# Patient Record
Sex: Female | Born: 1985 | Race: White | Hispanic: No | Smoking: Current every day smoker
Health system: Southern US, Community
[De-identification: ages and names within clinical notes are randomized; demographics above are authoritative.]

## PROBLEM LIST (undated history)

## (undated) ENCOUNTER — Inpatient Hospital Stay: Payer: Self-pay

## (undated) DIAGNOSIS — J45909 Unspecified asthma, uncomplicated: Secondary | ICD-10-CM

## (undated) DIAGNOSIS — R569 Unspecified convulsions: Secondary | ICD-10-CM

## (undated) DIAGNOSIS — G5600 Carpal tunnel syndrome, unspecified upper limb: Secondary | ICD-10-CM

## (undated) DIAGNOSIS — D6851 Activated protein C resistance: Secondary | ICD-10-CM

## (undated) DIAGNOSIS — I82409 Acute embolism and thrombosis of unspecified deep veins of unspecified lower extremity: Secondary | ICD-10-CM

## (undated) DIAGNOSIS — G43909 Migraine, unspecified, not intractable, without status migrainosus: Secondary | ICD-10-CM

## (undated) HISTORY — PX: ABSCESS DRAINAGE: SHX1119

## (undated) HISTORY — PX: TUBAL LIGATION: SHX77

## (undated) HISTORY — PX: DILATION AND CURETTAGE OF UTERUS: SHX78

---
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
## 2006-05-18 ENCOUNTER — Encounter: Payer: Self-pay | Admitting: Maternal & Fetal Medicine

## 2006-05-20 ENCOUNTER — Emergency Department: Payer: Self-pay | Admitting: Emergency Medicine

## 2006-05-20 ENCOUNTER — Other Ambulatory Visit: Payer: Self-pay

## 2006-06-02 ENCOUNTER — Other Ambulatory Visit: Payer: Self-pay

## 2006-06-02 ENCOUNTER — Emergency Department: Payer: Self-pay | Admitting: Emergency Medicine

## 2006-06-14 ENCOUNTER — Observation Stay: Payer: Self-pay | Admitting: Obstetrics and Gynecology

## 2006-06-22 ENCOUNTER — Observation Stay: Payer: Self-pay | Admitting: Obstetrics and Gynecology

## 2006-06-29 ENCOUNTER — Observation Stay: Payer: Self-pay | Admitting: Obstetrics and Gynecology

## 2006-06-29 ENCOUNTER — Encounter: Payer: Self-pay | Admitting: Maternal & Fetal Medicine

## 2006-07-10 ENCOUNTER — Observation Stay: Payer: Self-pay | Admitting: Obstetrics and Gynecology

## 2006-07-15 ENCOUNTER — Inpatient Hospital Stay: Payer: Self-pay | Admitting: Obstetrics and Gynecology

## 2006-07-28 ENCOUNTER — Emergency Department: Payer: Self-pay | Admitting: Emergency Medicine

## 2006-08-05 ENCOUNTER — Emergency Department: Payer: Self-pay

## 2006-08-18 ENCOUNTER — Inpatient Hospital Stay: Payer: Self-pay | Admitting: Internal Medicine

## 2006-08-20 ENCOUNTER — Inpatient Hospital Stay: Payer: Self-pay | Admitting: Internal Medicine

## 2006-08-26 ENCOUNTER — Emergency Department: Payer: Self-pay | Admitting: Emergency Medicine

## 2006-10-18 ENCOUNTER — Emergency Department: Payer: Self-pay | Admitting: Emergency Medicine

## 2006-11-10 ENCOUNTER — Emergency Department: Payer: Self-pay | Admitting: Emergency Medicine

## 2006-12-01 ENCOUNTER — Emergency Department: Payer: Self-pay | Admitting: Emergency Medicine

## 2007-01-18 ENCOUNTER — Emergency Department: Payer: Self-pay | Admitting: Emergency Medicine

## 2007-01-24 ENCOUNTER — Emergency Department: Payer: Self-pay | Admitting: Emergency Medicine

## 2007-02-15 ENCOUNTER — Encounter: Payer: Self-pay | Admitting: Maternal & Fetal Medicine

## 2007-02-18 ENCOUNTER — Emergency Department: Payer: Self-pay | Admitting: Emergency Medicine

## 2007-02-22 ENCOUNTER — Encounter: Payer: Self-pay | Admitting: Maternal & Fetal Medicine

## 2007-03-08 ENCOUNTER — Encounter: Payer: Self-pay | Admitting: Maternal & Fetal Medicine

## 2007-04-08 ENCOUNTER — Ambulatory Visit: Payer: Self-pay | Admitting: Maternal & Fetal Medicine

## 2007-04-08 ENCOUNTER — Encounter: Payer: Self-pay | Admitting: Maternal & Fetal Medicine

## 2007-05-06 ENCOUNTER — Encounter: Payer: Self-pay | Admitting: Obstetrics and Gynecology

## 2007-06-10 ENCOUNTER — Emergency Department: Payer: Self-pay | Admitting: Emergency Medicine

## 2007-06-20 ENCOUNTER — Observation Stay: Payer: Self-pay | Admitting: Obstetrics and Gynecology

## 2007-07-10 ENCOUNTER — Observation Stay: Payer: Self-pay

## 2007-07-25 ENCOUNTER — Emergency Department: Payer: Self-pay | Admitting: Emergency Medicine

## 2007-08-06 ENCOUNTER — Emergency Department: Payer: Self-pay | Admitting: Emergency Medicine

## 2007-09-06 ENCOUNTER — Emergency Department: Payer: Self-pay | Admitting: Emergency Medicine

## 2008-01-06 ENCOUNTER — Emergency Department: Payer: Self-pay | Admitting: Emergency Medicine

## 2008-05-31 ENCOUNTER — Emergency Department: Payer: Self-pay | Admitting: Emergency Medicine

## 2008-07-12 ENCOUNTER — Observation Stay: Payer: Self-pay

## 2008-08-10 ENCOUNTER — Encounter: Payer: Self-pay | Admitting: Obstetrics and Gynecology

## 2008-08-17 ENCOUNTER — Encounter: Payer: Self-pay | Admitting: Obstetrics and Gynecology

## 2008-08-24 ENCOUNTER — Encounter: Payer: Self-pay | Admitting: Obstetrics and Gynecology

## 2008-09-10 ENCOUNTER — Observation Stay: Payer: Self-pay | Admitting: Obstetrics and Gynecology

## 2008-09-26 ENCOUNTER — Inpatient Hospital Stay: Payer: Self-pay | Admitting: Obstetrics and Gynecology

## 2008-11-03 ENCOUNTER — Emergency Department: Payer: Self-pay | Admitting: Emergency Medicine

## 2010-03-25 ENCOUNTER — Emergency Department: Payer: Self-pay | Admitting: Emergency Medicine

## 2010-08-09 ENCOUNTER — Emergency Department: Payer: Self-pay | Admitting: Emergency Medicine

## 2010-12-17 ENCOUNTER — Emergency Department: Payer: Self-pay | Admitting: Emergency Medicine

## 2011-01-12 ENCOUNTER — Emergency Department: Payer: Self-pay | Admitting: Emergency Medicine

## 2011-01-20 ENCOUNTER — Emergency Department: Payer: Self-pay | Admitting: Emergency Medicine

## 2011-09-24 ENCOUNTER — Emergency Department: Payer: Self-pay | Admitting: *Deleted

## 2012-03-05 ENCOUNTER — Emergency Department: Payer: Self-pay | Admitting: Emergency Medicine

## 2012-04-25 ENCOUNTER — Emergency Department: Payer: Self-pay | Admitting: Emergency Medicine

## 2012-04-26 LAB — URINALYSIS, COMPLETE
Bilirubin,UR: NEGATIVE
Blood: NEGATIVE
Glucose,UR: NEGATIVE mg/dL (ref 0–75)
Ketone: NEGATIVE
Nitrite: NEGATIVE
Ph: 6 (ref 4.5–8.0)
Protein: NEGATIVE
Squamous Epithelial: 6
WBC UR: 3 /HPF (ref 0–5)

## 2012-04-26 LAB — CBC
HCT: 42.5 % (ref 35.0–47.0)
HGB: 14.2 g/dL (ref 12.0–16.0)
MCH: 30.3 pg (ref 26.0–34.0)
MCHC: 33.3 g/dL (ref 32.0–36.0)
MCV: 91 fL (ref 80–100)
RBC: 4.68 10*6/uL (ref 3.80–5.20)
RDW: 13.9 % (ref 11.5–14.5)

## 2012-04-26 LAB — COMPREHENSIVE METABOLIC PANEL
Anion Gap: 7 (ref 7–16)
Calcium, Total: 8.9 mg/dL (ref 8.5–10.1)
Co2: 25 mmol/L (ref 21–32)
Glucose: 84 mg/dL (ref 65–99)
Osmolality: 278 (ref 275–301)
SGOT(AST): 29 U/L (ref 15–37)
SGPT (ALT): 21 U/L (ref 12–78)
Total Protein: 8 g/dL (ref 6.4–8.2)

## 2012-11-18 ENCOUNTER — Emergency Department: Payer: Self-pay | Admitting: Emergency Medicine

## 2012-11-21 ENCOUNTER — Emergency Department: Payer: Self-pay | Admitting: Emergency Medicine

## 2013-03-15 ENCOUNTER — Emergency Department: Payer: Self-pay | Admitting: Emergency Medicine

## 2013-03-15 LAB — CBC WITH DIFFERENTIAL/PLATELET
Basophil #: 0 10*3/uL (ref 0.0–0.1)
Eosinophil #: 0.3 10*3/uL (ref 0.0–0.7)
Eosinophil %: 4.1 %
HGB: 14.3 g/dL (ref 12.0–16.0)
Lymphocyte #: 1.8 10*3/uL (ref 1.0–3.6)
Lymphocyte %: 29 %
MCH: 30.5 pg (ref 26.0–34.0)
MCV: 90 fL (ref 80–100)
Monocyte %: 7.9 %
Neutrophil #: 3.6 10*3/uL (ref 1.4–6.5)

## 2013-03-15 LAB — DRUG SCREEN, URINE
Barbiturates, Ur Screen: NEGATIVE (ref ?–200)
Benzodiazepine, Ur Scrn: NEGATIVE (ref ?–200)
Cannabinoid 50 Ng, Ur ~~LOC~~: POSITIVE (ref ?–50)
Cocaine Metabolite,Ur ~~LOC~~: NEGATIVE (ref ?–300)
MDMA (Ecstasy)Ur Screen: NEGATIVE (ref ?–500)
Opiate, Ur Screen: NEGATIVE (ref ?–300)
Phencyclidine (PCP) Ur S: NEGATIVE (ref ?–25)
Tricyclic, Ur Screen: NEGATIVE (ref ?–1000)

## 2013-03-15 LAB — URINALYSIS, COMPLETE
Blood: NEGATIVE
Glucose,UR: NEGATIVE mg/dL (ref 0–75)
Ketone: NEGATIVE
Ph: 6 (ref 4.5–8.0)
RBC,UR: 2 /HPF (ref 0–5)

## 2013-03-15 LAB — COMPREHENSIVE METABOLIC PANEL
Albumin: 3.7 g/dL (ref 3.4–5.0)
Anion Gap: 3 — ABNORMAL LOW (ref 7–16)
Bilirubin,Total: 0.2 mg/dL (ref 0.2–1.0)
Creatinine: 0.85 mg/dL (ref 0.60–1.30)
EGFR (Non-African Amer.): >60
Osmolality: 269 (ref 275–301)
SGPT (ALT): 21 U/L (ref 12–78)
Sodium: 135 mmol/L — ABNORMAL LOW (ref 136–145)
Total Protein: 7.2 g/dL (ref 6.4–8.2)

## 2013-03-15 LAB — GC/CHLAMYDIA PROBE AMP

## 2013-03-15 LAB — CK: CK, Total: 34 U/L (ref 21–215)

## 2013-04-22 ENCOUNTER — Emergency Department: Payer: Self-pay | Admitting: Emergency Medicine

## 2013-09-26 ENCOUNTER — Ambulatory Visit: Payer: Self-pay

## 2013-10-01 ENCOUNTER — Inpatient Hospital Stay: Payer: Self-pay | Admitting: Surgery

## 2013-10-01 LAB — CBC WITH DIFFERENTIAL/PLATELET
BASOS ABS: 0.1 10*3/uL (ref 0.0–0.1)
Basophil %: 0.3 %
EOS ABS: 0.1 10*3/uL (ref 0.0–0.7)
Eosinophil %: 0.4 %
HCT: 36.5 % (ref 35.0–47.0)
HGB: 11.9 g/dL — ABNORMAL LOW (ref 12.0–16.0)
LYMPHS ABS: 2.1 10*3/uL (ref 1.0–3.6)
Lymphocyte %: 8.5 %
MCH: 29.8 pg (ref 26.0–34.0)
MCHC: 32.8 g/dL (ref 32.0–36.0)
MCV: 91 fL (ref 80–100)
Monocyte #: 1.7 x10 3/mm — ABNORMAL HIGH (ref 0.2–0.9)
Monocyte %: 7.1 %
Neutrophil #: 20.5 10*3/uL — ABNORMAL HIGH (ref 1.4–6.5)
Neutrophil %: 83.7 %
PLATELETS: 332 10*3/uL (ref 150–440)
RBC: 4.01 10*6/uL (ref 3.80–5.20)
RDW: 13.3 % (ref 11.5–14.5)
WBC: 24.5 10*3/uL — AB (ref 3.6–11.0)

## 2013-10-01 LAB — URINALYSIS, COMPLETE
BLOOD: NEGATIVE
Glucose,UR: NEGATIVE mg/dL (ref 0–75)
Ketone: NEGATIVE
NITRITE: NEGATIVE
Ph: 5 (ref 4.5–8.0)
Protein: 100
RBC,UR: 14 /HPF (ref 0–5)
Specific Gravity: 1.026 (ref 1.003–1.030)
WBC UR: 70 /HPF (ref 0–5)

## 2013-10-01 LAB — COMPREHENSIVE METABOLIC PANEL
ALBUMIN: 2.9 g/dL — AB (ref 3.4–5.0)
AST: 28 U/L (ref 15–37)
Alkaline Phosphatase: 161 U/L — ABNORMAL HIGH
Anion Gap: 7 (ref 7–16)
BILIRUBIN TOTAL: 0.9 mg/dL (ref 0.2–1.0)
BUN: 5 mg/dL — ABNORMAL LOW (ref 7–18)
CHLORIDE: 101 mmol/L (ref 98–107)
Calcium, Total: 8.8 mg/dL (ref 8.5–10.1)
Co2: 26 mmol/L (ref 21–32)
Creatinine: 0.96 mg/dL (ref 0.60–1.30)
EGFR (African American): >60
EGFR (Non-African Amer.): >60
GLUCOSE: 96 mg/dL (ref 65–99)
Osmolality: 265 (ref 275–301)
Potassium: 3.3 mmol/L — ABNORMAL LOW (ref 3.5–5.1)
SGPT (ALT): 31 U/L (ref 12–78)
Sodium: 134 mmol/L — ABNORMAL LOW (ref 136–145)
Total Protein: 7.9 g/dL (ref 6.4–8.2)

## 2013-10-03 LAB — URINE CULTURE

## 2013-10-03 LAB — VANCOMYCIN, TROUGH: VANCOMYCIN, TROUGH: 17 ug/mL (ref 10–20)

## 2013-10-03 LAB — CREATININE, SERUM
Creatinine: 1.27 mg/dL (ref 0.60–1.30)
EGFR (African American): >60
EGFR (Non-African Amer.): 58 — ABNORMAL LOW

## 2013-10-04 LAB — CREATININE, SERUM
CREATININE: 1.24 mg/dL (ref 0.60–1.30)
EGFR (Non-African Amer.): 60 — ABNORMAL LOW

## 2013-10-05 LAB — CREATININE, SERUM
CREATININE: 1.1 mg/dL (ref 0.60–1.30)
EGFR (African American): >60
EGFR (Non-African Amer.): >60

## 2013-10-05 LAB — VANCOMYCIN, TROUGH: Vancomycin, Trough: 15 ug/mL (ref 10–20)

## 2013-10-05 LAB — HEMOGLOBIN: HGB: 9.8 g/dL — ABNORMAL LOW (ref 12.0–16.0)

## 2013-10-06 LAB — CULTURE, BLOOD (SINGLE)

## 2013-11-29 ENCOUNTER — Encounter: Payer: Self-pay | Admitting: General Surgery

## 2013-12-22 ENCOUNTER — Encounter: Payer: Self-pay | Admitting: General Surgery

## 2014-07-15 NOTE — Op Note (Signed)
PATIENT NAME:  Brianna Ashley, Brianna Ashley MR#:  [id number] DATE OF BIRTH:  09-10-1985  DATE OF PROCEDURE:  10/04/2013  PREOPERATIVE DIAGNOSIS: Right buttock abscess.   POSTOPERATIVE DIAGNOSIS:  Right buttock abscess.  PROCEDURE PERFORMED: Examination under anesthesia, pulsed lavage irrigation of wound, sharp debridement of soft tissue including skin and subcutaneous fat.   SURGEON: Natale LayMark Tamasha Laplante, M.D. FACS  ASSISTANT: None.   ANESTHESIA: General endotracheal.   FINDINGS: Pus.   SPECIMENS: None.   DESCRIPTION OF PROCEDURE: With informed consent, general endotracheal anesthesia, and prone position, the patient'Ashley existing dressing was removed. Penrose drains were removed. The wound and area was prepped and draped with Betadine solution. Timeout was observed. A large amount of purulent material was drained from the largest of the open wounds. There were a total of 4 wounds created by Dr. Juliann PulseLundquist several days ago in the operating room. These were all probed with the Yankauer suction, appeared to be no un-drained pockets. Tissues appear to be viable underneath fibrinopurulent material. Pulsed lavage irrigation was performed with approximately 3 liters of normal saline. Hemostasis being obtained with point cautery. Inch wide Penrose drains were then placed and secured at the skin edges with nylon suture. Sterile dressings were then applied and the patient was then returned supine, extubated and taken to the recovery room in stable and satisfactory condition by anesthesia services.   ____________________________ Redge GainerMark A. Egbert GaribaldiBird, MD mab:ts D: 10/04/2013 11:24:26 ET T: 10/04/2013 11:55:18 ET JOB#: [id number]  cc: Loraine LericheMark A. Egbert GaribaldiBird, MD, <Dictator> Raynald KempMARK A Cayson Kalb MD ELECTRONICALLY SIGNED 10/09/2013 18:46

## 2014-07-15 NOTE — Op Note (Signed)
PATIENT NAME:  Brianna Ashley, [name]:  [id number] DATE OF BIRTH:  Aug 07, 1985  DATE OF PROCEDURE:  10/01/2013  ATTENDING PHYSICIAN: Dr. Salome Holmeshris Harrington Jobe.   PREOPERATIVE DIAGNOSIS: Large right buttock abscess with necrosis.   POSTOPERATIVE DIAGNOSIS: Large right buttock abscess with necrosis.   PROCEDURE PERFORMED: Incision and drainage and debridement of large right buttock abscess.   ANESTHESIA: General.   ESTIMATED BLOOD LOSS: 25 mL.   COMPLICATIONS: None.   SPECIMEN: None.   INDICATION FOR SURGERY: [name] is a 29 year old female who comes in with 3 days of worsening severe right buttock pain. She had a leukocytosis and a CT scan which showed subcutaneous emphysema and inflammation. She was brought to the operating room for concern for necrotizing soft tissue infection.   DETAILS OF PROCEDURE: Are as follows: Informed consent was obtained. [name] was brought to the operating room suite. She was induced. Endotracheal tube was placed. Anesthesia: General anesthesia was given. She was then flipped in the prone position. Her buttocks were prepped and draped in standard surgical fashion. A timeout was then performed correctly identifying the patient name, operative site, and procedure to be performed. I then examined her buttocks. She had a large area of necrosis with obvious purulence. This was incised and there was a large amount of necrotic tissue as well as purulence. I debrided this necrotic tissue. Total size was approximately 2 x 2 cm. I then placed a hemostat into the cavity which tracked up approximately 6 cm. I then placed counter incisions at the end of these tracks and through these placed 2 Penrose drains, which were sutured to the skin. The wound was then irrigated and debrided further. Once I was happy with hemostasis and debridement, I packed the wound with Betadine-soaked Kerlix and placed a sterile dressing. The patient was then awoken, extubated, and brought to the  postanesthesia care unit. There were no immediate complications. Needle, sponge, and instrument counts were correct at the end of the procedure.    ____________________________ Si Raiderhristopher A. Nga Rabon, MD cal:lt D: 10/02/2013 09:18:00 ET T: 10/02/2013 20:14:15 ET JOB#: [id number]  cc: Cristal Deerhristopher A. Tylen Leverich, MD, <Dictator> Jarvis NewcomerHRISTOPHER A Mackenzi Krogh MD ELECTRONICALLY SIGNED 10/20/2013 14:51

## 2014-07-15 NOTE — H&P (Signed)
   Subjective/Chief Complaint Right buttock pain x 3 days.  Recent fall   History of Present Illness Brianna Ashley is a pleasant 29 yo F with a history of FV leiden and PE who presents with 3 days of worsening right buttock pain.  Iniitally evaluated on July 4 following a fall on the buttocks.  Over the past 3 days she has had increased pain, redness and induration which has now became unbrearable.  No drainage.   Past History H/o PE FV leiden deficiency DVT Anemia Seizures ADD PTSD Depression Bipolar   Past Med/Surgical Hx:  Post Traumatic Stress Disorder:   Depression:   Bipolar Disorder:   Pulmonary Embolus:   Leiden V factor blood disorder, hypercoagulability:   Deep Vein Thrombosis - DVT:   Anemia:   Seizures:   ADD:   asthma:   ALLERGIES:  Ritalin: Resp Arrest  Ambien: Resp. Distress  Tramadol: Dizzy/Fainting  Family and Social History:  Family History Non-Contributory   Social History positive  tobacco, negative ETOH   + Tobacco Current (within 1 year)   Review of Systems:  Subjective/Chief Complaint Right buttock pain, swelling, redness   Fever/Chills No   Cough No   Sputum No   Abdominal Pain No   Diarrhea No   Constipation No   Nausea/Vomiting No   SOB/DOE No   Chest Pain No   Dysuria No   Tolerating PT No   Tolerating Diet Nauseated   Physical Exam:  GEN well developed, well nourished, no acute distress   HEENT pink conjunctivae, PERRL   NECK No masses   RESP normal resp effort  clear BS  no use of accessory muscles   CARD regular rate  no murmur   ABD denies tenderness  denies Flank Tenderness  no hernia  soft  normal BS   EXTR + right buttock erythema/induration   SKIN No rashes, No ulcers   NEURO cranial nerves deficit, negative rigidity, negative tremor, follows commands   PSYCH A+O to time, place, person, good insight    Assessment/Admission Diagnosis Right buttock cellulitis with soft tissue gas, concerning for  necrotizing soft tissue infection.   Plan IVF, abx, to OR for aggressive debridement for control of problable necrotizing soft tissue infection.   Electronic Signatures: Jarvis NewcomerLundquist, Dawaun Brancato A (MD)  (Signed 11-Jul-15 16:55)  Authored: CHIEF COMPLAINT and HISTORY, PAST MEDICAL/SURGIAL HISTORY, ALLERGIES, FAMILY AND SOCIAL HISTORY, REVIEW OF SYSTEMS, PHYSICAL EXAM, ASSESSMENT AND PLAN   Last Updated: 11-Jul-15 16:55 by Jarvis NewcomerLundquist, Adonis Yim A (MD)

## 2014-07-15 NOTE — Discharge Summary (Signed)
PATIENT NAME:  Brianna Ashley, Brianna Ashley MR#:  [id number] DATE OF BIRTH:  03-05-1986  DATE OF ADMISSION:  10/01/2013 DATE OF DISCHARGE:  10/05/2013  FINAL DIAGNOSIS: Soft tissue infection, right buttock.  PRINCIPLE PROCEDURES: Incision and drainage x 2.   HOSPITAL COURSE SUMMARY: The patient was admitted with soft tissue infection and underwent incision and drainage. She then underwent repeat incision and drainage on the 14th of July. She had adequate pain control. She was taught dressing changes. The patient was deemed suitable for discharge on the 15th of July. Medication reconciliation form will be found in the chart. Follow up in my office in 1 weeks' time.  Call with any questions.   ____________________________ Redge GainerMark A. Egbert GaribaldiBird, MD mab:jh D: 10/18/2013 19:23:00 ET T: 10/18/2013 19:31:25 ET JOB#: [id number]  cc: Loraine LericheMark A. Egbert GaribaldiBird, MD, <Dictator> Rusti Arizmendi A Vernice Mannina MD ELECTRONICALLY SIGNED 10/18/2013 21:00

## 2014-08-15 ENCOUNTER — Emergency Department
Admission: EM | Admit: 2014-08-15 | Discharge: 2014-08-16 | Disposition: A | Discharge status: Home / Self Care | Payer: Medicaid Other | Attending: Emergency Medicine | Admitting: Emergency Medicine

## 2014-08-15 ENCOUNTER — Emergency Department: Payer: Medicaid Other

## 2014-08-15 DIAGNOSIS — S61552A Open bite of left wrist, initial encounter: Secondary | ICD-10-CM | POA: Diagnosis not present

## 2014-08-15 DIAGNOSIS — Y998 Other external cause status: Secondary | ICD-10-CM | POA: Diagnosis not present

## 2014-08-15 DIAGNOSIS — S61452A Open bite of left hand, initial encounter: Secondary | ICD-10-CM

## 2014-08-15 DIAGNOSIS — W540XXA Bitten by dog, initial encounter: Secondary | ICD-10-CM | POA: Insufficient documentation

## 2014-08-15 DIAGNOSIS — Y9289 Other specified places as the place of occurrence of the external cause: Secondary | ICD-10-CM | POA: Insufficient documentation

## 2014-08-15 DIAGNOSIS — Z23 Encounter for immunization: Secondary | ICD-10-CM | POA: Diagnosis not present

## 2014-08-15 DIAGNOSIS — Z79899 Other long term (current) drug therapy: Secondary | ICD-10-CM | POA: Diagnosis not present

## 2014-08-15 DIAGNOSIS — Y9389 Activity, other specified: Secondary | ICD-10-CM | POA: Insufficient documentation

## 2014-08-15 MED ORDER — TETANUS-DIPHTHERIA TOXOIDS TD 5-2 LFU IM INJ
0.5000 mL | INJECTION | Freq: Once | INTRAMUSCULAR | Status: AC
  Administered 2014-08-16: 0.5 mL via INTRAMUSCULAR

## 2014-08-15 MED ORDER — AMOXICILLIN-POT CLAVULANATE 875-125 MG PO TABS
1.0000 | ORAL_TABLET | Freq: Once | ORAL | Status: AC
  Administered 2014-08-16: 1 via ORAL

## 2014-08-15 MED ORDER — BACITRACIN ZINC 500 UNIT/GM EX OINT
TOPICAL_OINTMENT | CUTANEOUS | Status: AC
  Filled 2014-08-15: qty 2.7

## 2014-08-15 MED ORDER — OXYCODONE-ACETAMINOPHEN 5-325 MG PO TABS
1.0000 | ORAL_TABLET | Freq: Once | ORAL | Status: AC
  Administered 2014-08-16: 1 via ORAL

## 2014-08-15 NOTE — ED Provider Notes (Signed)
[facility] Emergency Department Provider Note  ____________________________________________  Time seen: 11:30 PM  I have reviewed the triage vital signs and the nursing notes.   HISTORY  Chief Complaint Animal Bite      HPI Brianna Ashley is a 29 y.o. female presents with history of dog bite to the left wrists. Dog is currently in quarantine as it was her friend's dog who is at bedside. Patient unknown date of last tetanus shot. Patient currently admits to 9 out of 10 left wrist pain.     No past medical history on file.  There are no active problems to display for this patient.   No past surgical history on file.  Current Outpatient Rx  Name  Route  Sig  Dispense  Refill  . clonazePAM (KLONOPIN) 1 MG tablet   Oral   Take 1 tablet by mouth 3 (three) times daily.      2   . QUEtiapine (SEROQUEL) 25 MG tablet   Oral   Take 25 mg by mouth 2 (two) times daily.           Allergies Ambien and Ritalin  No family history on file.  Social History History  Substance Use Topics  . Smoking status: Not on file  . Smokeless tobacco: Not on file  . Alcohol Use: Not on file    Review of Systems  Constitutional: Negative for fever. Eyes: Negative for visual changes. ENT: Negative for sore throat. Cardiovascular: Negative for chest pain. Respiratory: Negative for shortness of breath. Gastrointestinal: Negative for abdominal pain, vomiting and diarrhea. Genitourinary: Negative for dysuria. Musculoskeletal: Negative for back pain. Left wrist pain positive Skin: Negative for rash. Neurological: Negative for headaches, focal weakness or numbness.   10-point ROS otherwise negative.  ____________________________________________   PHYSICAL EXAM:  VITAL SIGNS: ED Triage Vitals  Enc Vitals Group     BP 08/15/14 2157 120/76 mmHg     Pulse Rate 08/15/14 2157 83     Resp 08/15/14 2157 18     Temp 08/15/14 2157 98.5 F (36.9 C)      Temp Source 08/15/14 2157 Oral     SpO2 08/15/14 2157 100 %     Weight 08/15/14 2157 235 lb (106.595 kg)     Height 08/15/14 2157 5\' 2"  (1.575 m)     Head Cir --      Peak Flow --      Pain Score 08/15/14 2159 6     Pain Loc --      Pain Edu? --      Excl. in GC? --      Constitutional: Alert and oriented. Well appearing and in no distress. Eyes: Conjunctivae are normal. PERRL. Normal extraocular movements. ENT   Head: Normocephalic and atraumatic.   Nose: No congestion/rhinnorhea.   Mouth/Throat: Mucous membranes are moist.   Neck: No stridor. Cardiovascular: Normal rate, regular rhythm. Normal and symmetric distal pulses are present in all extremities. No murmurs, rubs, or gallops. Respiratory: Normal respiratory effort without tachypnea nor retractions. Breath sounds are clear and equal bilaterally. No wheezes/rales/rhonchi. Gastrointestinal: Soft and nontender. No distention. There is no CVA tenderness. Genitourinary: deferred Musculoskeletal: No joint effusions.  No lower extremity tenderness nor edema. Left wrist pain with palpation. Neurologic:  Normal speech and language. No gross focal neurologic deficits are appreciated. Speech is normal.  Skin:  Skin is warm, dry . No rash noted. Patient is noted to the left wrist Psychiatric: Mood and affect are normal.  Speech and behavior are normal. Patient exhibits appropriate insight and judgment.  ____________________________________________    RADIOLOGY left wrist x-ray negative  ____________________________________________     INITIAL IMPRESSION / ASSESSMENT AND PLAN / ED COURSE  Pertinent labs & imaging results that were available during my care of the patient were reviewed by me and considered in my medical decision making (see chart for details).  Physical exam consistent with dog bite as such patient received Augmentin 875 mg by mouth and Percocet for  pain.  ____________________________________________   FINAL CLINICAL IMPRESSION(S) / ED DIAGNOSES  Final diagnoses:  Dog bite, hand, left, initial encounter      Darci Current, MD 08/16/14 [phone]

## 2014-08-15 NOTE — ED Notes (Signed)
Pt has abrasions to left hand from dog bite, owner is here with patient and dog has been taken for testing.

## 2014-08-16 MED ORDER — OXYCODONE-ACETAMINOPHEN 5-325 MG PO TABS
ORAL_TABLET | ORAL | Status: AC
  Administered 2014-08-16: 1 via ORAL
  Filled 2014-08-16: qty 1

## 2014-08-16 MED ORDER — AMOXICILLIN-POT CLAVULANATE 875-125 MG PO TABS
ORAL_TABLET | ORAL | Status: AC
  Administered 2014-08-16: 1 via ORAL
  Filled 2014-08-16: qty 1

## 2014-08-16 MED ORDER — TETANUS-DIPHTHERIA TOXOIDS TD 5-2 LFU IM INJ
INJECTION | INTRAMUSCULAR | Status: AC
  Administered 2014-08-16: 0.5 mL via INTRAMUSCULAR
  Filled 2014-08-16: qty 0.5

## 2014-08-16 MED ORDER — AMOXICILLIN-POT CLAVULANATE 875-125 MG PO TABS: 1.0000 | ORAL_TABLET | Freq: Two times a day (BID) | ORAL | Status: DC

## 2014-08-16 MED ORDER — OXYCODONE-ACETAMINOPHEN 5-325 MG PO TABS: 1.0000 | ORAL_TABLET | ORAL | Status: DC | PRN

## 2014-08-16 NOTE — Discharge Instructions (Signed)

## 2014-08-26 ENCOUNTER — Encounter: Payer: Self-pay | Admitting: Emergency Medicine

## 2014-08-26 DIAGNOSIS — G8929 Other chronic pain: Secondary | ICD-10-CM | POA: Diagnosis not present

## 2014-08-26 DIAGNOSIS — M545 Low back pain: Secondary | ICD-10-CM | POA: Insufficient documentation

## 2014-08-26 DIAGNOSIS — Z87828 Personal history of other (healed) physical injury and trauma: Secondary | ICD-10-CM | POA: Insufficient documentation

## 2014-08-26 DIAGNOSIS — Z720 Tobacco use: Secondary | ICD-10-CM | POA: Insufficient documentation

## 2014-08-26 DIAGNOSIS — Z792 Long term (current) use of antibiotics: Secondary | ICD-10-CM | POA: Insufficient documentation

## 2014-08-26 DIAGNOSIS — F419 Anxiety disorder, unspecified: Secondary | ICD-10-CM | POA: Diagnosis not present

## 2014-08-26 DIAGNOSIS — Z79899 Other long term (current) drug therapy: Secondary | ICD-10-CM | POA: Diagnosis not present
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
## 2014-08-27 ENCOUNTER — Emergency Department
Admission: EM | Admit: 2014-08-27 | Discharge: 2014-08-27 | Disposition: A | Discharge status: Home / Self Care | Payer: Medicaid Other | Attending: Emergency Medicine | Admitting: Emergency Medicine

## 2014-08-27 DIAGNOSIS — M545 Low back pain, unspecified: Secondary | ICD-10-CM

## 2014-08-27 MED ORDER — OXYCODONE-ACETAMINOPHEN 5-325 MG PO TABS
1.0000 | ORAL_TABLET | Freq: Once | ORAL | Status: AC
Start: 2014-08-27 — End: 2014-08-27
  Administered 2014-08-27: 1 via ORAL

## 2014-08-27 MED ORDER — PREDNISONE 20 MG PO TABS
ORAL_TABLET | ORAL | Status: AC
  Administered 2014-08-27: 60 mg via ORAL
  Filled 2014-08-27: qty 3

## 2014-08-27 MED ORDER — KETOROLAC TROMETHAMINE 10 MG PO TABS: 10.0000 mg | ORAL_TABLET | Freq: Four times a day (QID) | ORAL | Status: DC | PRN

## 2014-08-27 MED ORDER — OXYCODONE-ACETAMINOPHEN 5-325 MG PO TABS
ORAL_TABLET | ORAL | Status: AC
Start: 2014-08-27 — End: 2014-08-27
  Administered 2014-08-27: 1 via ORAL
  Filled 2014-08-27: qty 1

## 2014-08-27 MED ORDER — PREDNISONE 20 MG PO TABS
60.0000 mg | ORAL_TABLET | Freq: Once | ORAL | Status: AC
  Administered 2014-08-27: 60 mg via ORAL

## 2014-08-27 NOTE — Discharge Instructions (Signed)
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
## 2014-09-20 ENCOUNTER — Encounter: Payer: Self-pay | Admitting: Medical Oncology

## 2014-09-20 ENCOUNTER — Emergency Department
Admission: EM | Admit: 2014-09-20 | Discharge: 2014-09-20 | Disposition: A | Discharge status: Home / Self Care | Payer: Medicaid Other | Attending: Emergency Medicine | Admitting: Emergency Medicine

## 2014-09-20 DIAGNOSIS — Z79899 Other long term (current) drug therapy: Secondary | ICD-10-CM | POA: Diagnosis not present

## 2014-09-20 DIAGNOSIS — K029 Dental caries, unspecified: Secondary | ICD-10-CM | POA: Insufficient documentation

## 2014-09-20 DIAGNOSIS — H9203 Otalgia, bilateral: Secondary | ICD-10-CM | POA: Insufficient documentation

## 2014-09-20 DIAGNOSIS — K088 Other specified disorders of teeth and supporting structures: Secondary | ICD-10-CM | POA: Diagnosis present

## 2014-09-20 DIAGNOSIS — K047 Periapical abscess without sinus: Secondary | ICD-10-CM | POA: Diagnosis not present

## 2014-09-20 DIAGNOSIS — L0291 Cutaneous abscess, unspecified: Secondary | ICD-10-CM

## 2014-09-20 DIAGNOSIS — Z720 Tobacco use: Secondary | ICD-10-CM | POA: Diagnosis not present

## 2014-09-20 DIAGNOSIS — K0889 Other specified disorders of teeth and supporting structures: Secondary | ICD-10-CM

## 2014-09-20 MED ORDER — OXYCODONE-ACETAMINOPHEN 5-325 MG PO TABS
1.0000 | ORAL_TABLET | Freq: Once | ORAL | Status: AC
  Administered 2014-09-20: 1 via ORAL

## 2014-09-20 MED ORDER — CLINDAMYCIN HCL 300 MG PO CAPS: 300.0000 mg | ORAL_CAPSULE | Freq: Four times a day (QID) | ORAL | Status: DC

## 2014-09-20 MED ORDER — IBUPROFEN 800 MG PO TABS: 800.0000 mg | ORAL_TABLET | Freq: Three times a day (TID) | ORAL | Status: DC | PRN

## 2014-09-20 MED ORDER — LIDOCAINE VISCOUS 2 % MT SOLN
15.0000 mL | Freq: Once | OROMUCOSAL | Status: AC
  Administered 2014-09-20: 15 mL via OROMUCOSAL

## 2014-09-20 MED ORDER — OXYCODONE-ACETAMINOPHEN 5-325 MG PO TABS: 1.0000 | ORAL_TABLET | Freq: Three times a day (TID) | ORAL | Status: DC | PRN

## 2014-09-20 MED ORDER — OXYCODONE-ACETAMINOPHEN 5-325 MG PO TABS
ORAL_TABLET | ORAL | Status: AC
  Filled 2014-09-20: qty 1

## 2014-09-20 MED ORDER — LIDOCAINE VISCOUS 2 % MT SOLN
OROMUCOSAL | Status: AC
  Filled 2014-09-20: qty 15

## 2014-09-20 NOTE — ED Provider Notes (Signed)
[facility] Emergency Department Provider Note  ____________________________________________  Time seen: Approximately 8:18 PM  I have reviewed the triage vital signs and the nursing notes.   HISTORY  Chief Complaint Dental Pain and Abscess   HPI Brianna Ashley is a 29 y.o. female presents to the ER for multiple medical complaints. Patient states that she is having her right lower dental pain with an abscess that she can fill. Patient states that she has several broken teeth and history of dental pain as well as abscesses. Reports continues to eat injury well. Denies fever. States pain is currently 7 out of 10. Patient states pain has been present 2-3 days and reports that she has tried to drain abscess at home unsuccessfully. States she tried to "poke it with a needle. "  Patient also reports bilateral air pain. States left ear hurts worse than right. Denies fall or injury. Denies hearing changes. Denies foreign body. States here pain is currently 4 out of 10. States unsure if pain is radiating from teeth. Patient states pain has been present times one day.  Also reports that she has 2 abscesses in her groin area. Patient states that these occur frequently for her after she shaves. Denies redness, drainage. Reports these have been present 2 or 3 days. States pain to this area is 5 out of 10 and aching. Patient states that it hurts to touch.  Denies fevers, abdominal pain, neck or back pain. Reports continues to eat and drink well.   History reviewed. No pertinent past medical history. Anxiety Chronic pain There are no active problems to display for this patient.   History reviewed. No pertinent past surgical history.  Current Outpatient Rx  Name  Route  Sig  Dispense  Refill  .           . clonazePAM (KLONOPIN) 1 MG tablet   Oral   Take 1 tablet by mouth 3 (three) times daily.      2   . ketorolac (TORADOL) 10 MG tablet   Oral   Take 1 tablet (10  mg total) by mouth every 6 (six) hours as needed for moderate pain.   12 tablet   0   .           . QUEtiapine (SEROQUEL) 25 MG tablet   Oral   Take 25 mg by mouth 2 (two) times daily.           Allergies Ambien and Ritalin  No family history on file.  Social History History  Substance Use Topics  . Smoking status: Current Every Day Smoker -- 2.00 packs/day for 20 years    Types: Cigarettes  . Smokeless tobacco: Not on file  . Alcohol Use: No    Review of Systems Constitutional: No fever/chills Eyes: No visual changes. ENT: No sore throat. Positive for dental pain and ear pain as above. Cardiovascular: Denies chest pain. Respiratory: Denies shortness of breath. Gastrointestinal: No abdominal pain.  No nausea, no vomiting.  No diarrhea.  No constipation. Genitourinary: Negative for dysuria. Musculoskeletal: Negative for back pain. Skin: Negative for rash. Positive for abscesses as above. Neurological: Negative for headaches, focal weakness or numbness.  10-point ROS otherwise negative.  ____________________________________________   PHYSICAL EXAM:  VITAL SIGNS: ED Triage Vitals  Enc Vitals Group     BP 09/20/14 1835 117/64 mmHg     Pulse Rate 09/20/14 1835 65     Resp 09/20/14 1835 18     Temp 09/20/14  1835 98 F (36.7 C)     Temp Source 09/20/14 1835 Oral     SpO2 09/20/14 1835 98 %     Weight 09/20/14 1835 240 lb (108.863 kg)     Height 09/20/14 1835 5\' 5"  (1.651 m)     Head Cir --      Peak Flow --      Pain Score 09/20/14 1836 10     Pain Loc --      Pain Edu? --      Excl. in GC? --   Exam completed with Lauren RN at bedside.  Constitutional: Alert and oriented. Well appearing and in no acute distress. Eyes: Conjunctivae are normal. PERRL. EOMI. Head: Atraumatic. No facial swelling or erythema. Ears: right: no erythema, normal TMs left: minimal erythema, normal TMs, no exudate or drainage. Hearing grossly intact bilaterally.  Nose: No  congestion/rhinnorhea. Mouth/Throat: Mucous membranes are moist.  Oropharynx non-erythematous. Widespread dental decay with multiple fractures. Right lower teeth with multiple fractures and dental caries with moderate tender to palpation along teeth 28 through 31 with palpable fluctuant abscess 0.5 x 0.5 cm to inner gumline. Mild surrounding erythema. No other dental abscess palpated or visualized. Neck: No stridor.  No cervical spine tenderness to palpation. Hematological/Lymphatic/Immunilogical: No cervical lymphadenopathy. Cardiovascular: Normal rate, regular rhythm. Grossly normal heart sounds.  Good peripheral circulation. Respiratory: Normal respiratory effort.  No retractions. Lungs CTAB. Gastrointestinal: Soft and nontender. No distention. No abdominal bruits. No CVA tenderness. Musculoskeletal: No lower extremity tenderness nor edema.  No joint effusions. Neurologic:  Normal speech and language. No gross focal neurologic deficits are appreciated. Speech is normal. No gait instability. Skin:  Skin is warm, dry and intact. No rash noted. Except: Left upper thigh adjacent to left inguinal crease small minimally erythematous area of induration 0.5 x 0.5 cm. No surrounding erythema. No fluctuance. No I&D indication. Mild to moderate tender to palpation. No Drainage. Left lateral mons pubis with superficial 1 x 1 cm area of induration minimally erythematous. No surrounding erythema. No fluctuance. No I&D indication. Mild to moderate tender to palpation. No drainage. Psychiatric: Mood and affect are normal. Speech and behavior are normal.  ____________________________________________   LABS (all labs ordered are listed, but only abnormal results are displayed)  Labs Reviewed - No data to display ____________________________________________   PROCEDURES  Procedure(s) performed: yes  Procedure explained to patient and verbal consent obtained. Anesthesia obtained with 10 ML's of 2% viscous  lidocaine,   Right lower inner gumline abscess incised with #11 blade with immediate moderate amount purulent return. Patient reports pain immediately improved post incision. Patient tolerated procedure well.  ____________________________________________   INITIAL IMPRESSION / ASSESSMENT AND PLAN / ED COURSE  Pertinent labs & imaging results that were available during my care of the patient were reviewed by me and considered in my medical decision making (see chart for details).  Presents to the ER for complaints of multiple medical complaints. Complaints include bilateral ear pain for right lower dental pain with dental abscess as well as abscesses to left groin. Suspect dental radiation pain causing ear pain.  Right lower dental abscess drained in ER with immediate improved pain. Left groin with 2 small indurated areas, no fluctuance. No I&D indicated. We'll treat patient with oral clindamycin, ibuprofen and Percocet as needed for breakthrough pain. Discussed strict follow-up and return parameters. Patient agreed to plan. ___________________________________________   FINAL CLINICAL IMPRESSION(S) / ED DIAGNOSES  Final diagnoses:  Otalgia of both ears  Dental abscess  Pain, dental  Abscess to left upper thigh and perineal       Renford Dills, NP 09/20/14 2121  Loleta Rose, MD 09/21/14 1610

## 2014-09-20 NOTE — ED Notes (Signed)
Pt ambulatory to triage with c/o toothache and abscess to thigh.

## 2014-09-20 NOTE — Discharge Instructions (Signed)
Take medication as prescribed. Rinse mouth with warm salt water multiple times per day follow-up with dentist as soon as possible.  Avoid shaving as that can increase abscesses in the perineal area. Apply warm compresses.   Follow-up with her primary care physician this week.  Return to the ER for new or worsening concerns.  Dental Abscess A dental abscess is a collection of infected fluid (pus) from a bacterial infection in the inner part of the tooth (pulp). It usually occurs at the end of the tooth's root.  CAUSES   Severe tooth decay.  Trauma to the tooth that allows bacteria to enter into the pulp, such as a broken or chipped tooth. SYMPTOMS   Severe pain in and around the infected tooth.  Swelling and redness around the abscessed tooth or in the mouth or face.  Tenderness.  Pus drainage.  Bad breath.  Bitter taste in the mouth.  Difficulty swallowing.  Difficulty opening the mouth.  Nausea.  Vomiting.  Chills.  Swollen neck glands. DIAGNOSIS   A medical and dental history will be taken.  An examination will be performed by tapping on the abscessed tooth.  X-rays may be taken of the tooth to identify the abscess. TREATMENT The goal of treatment is to eliminate the infection. You may be prescribed antibiotic medicine to stop the infection from spreading. A root canal may be performed to save the tooth. If the tooth cannot be saved, it may be pulled (extracted) and the abscess may be drained.  HOME CARE INSTRUCTIONS  Only take over-the-counter or prescription medicines for pain, fever, or discomfort as directed by your caregiver.  Rinse your mouth (gargle) often with salt water ( tsp salt in 8 oz [250 ml] of warm water) to relieve pain or swelling.  Do not drive after taking pain medicine (narcotics).  Do not apply heat to the outside of your face.  Return to your dentist for further treatment as directed. SEEK MEDICAL CARE IF:  Your pain is not  helped by medicine.  Your pain is getting worse instead of better. SEEK IMMEDIATE MEDICAL CARE IF:  You have a fever or persistent symptoms for more than 2-3 days.  You have a fever and your symptoms suddenly get worse.  You have chills or a very bad headache.  You have problems breathing or swallowing.  You have trouble opening your mouth.  You have swelling in the neck or around the eye. Document Released: 03/10/2005 Document Revised: 12/03/2011 Document Reviewed: 06/18/2010 [facility] Patient Information 2015 [place], [place]. This information is not intended to replace advice given to you by your health care provider. Make sure you discuss any questions you have with your health care provider.  Abscess An abscess is an infected area that contains a collection of pus and debris.It can occur in almost any part of the body. An abscess is also known as a furuncle or boil. CAUSES  An abscess occurs when tissue gets infected. This can occur from blockage of oil or sweat glands, infection of hair follicles, or a minor injury to the skin. As the body tries to fight the infection, pus collects in the area and creates pressure under the skin. This pressure causes pain. People with weakened immune systems have difficulty fighting infections and get certain abscesses more often.  SYMPTOMS Usually an abscess develops on the skin and becomes a painful mass that is red, warm, and tender. If the abscess forms under the skin, you may feel a moveable soft  area under the skin. Some abscesses break open (rupture) on their own, but most will continue to get worse without care. The infection can spread deeper into the body and eventually into the bloodstream, causing you to feel ill.  DIAGNOSIS  Your caregiver will take your medical history and perform a physical exam. A sample of fluid may also be taken from the abscess to determine what is causing your infection. TREATMENT  Your caregiver may prescribe  antibiotic medicines to fight the infection. However, taking antibiotics alone usually does not cure an abscess. Your caregiver may need to make a small cut (incision) in the abscess to drain the pus. In some cases, gauze is packed into the abscess to reduce pain and to continue draining the area. HOME CARE INSTRUCTIONS   Only take over-the-counter or prescription medicines for pain, discomfort, or fever as directed by your caregiver.  If you were prescribed antibiotics, take them as directed. Finish them even if you start to feel better.  If gauze is used, follow your caregiver's directions for changing the gauze.  To avoid spreading the infection:  Keep your draining abscess covered with a bandage.  Wash your hands well.  Do not share personal care items, towels, or whirlpools with others.  Avoid skin contact with others.  Keep your skin and clothes clean around the abscess.  Keep all follow-up appointments as directed by your caregiver. SEEK MEDICAL CARE IF:   You have increased pain, swelling, redness, fluid drainage, or bleeding.  You have muscle aches, chills, or a general ill feeling.  You have a fever. MAKE SURE YOU:   Understand these instructions.  Will watch your condition.  Will get help right away if you are not doing well or get worse. Document Released: 12/18/2004 Document Revised: 09/09/2011 Document Reviewed: 05/23/2011 [facility] Patient Information 2015 [place], [place]. This information is not intended to replace advice given to you by your health care provider. Make sure you discuss any questions you have with your health care provider.  Otalgia The most common reason for this in children is an infection of the middle ear. Pain from the middle ear is usually caused by a build-up of fluid and pressure behind the eardrum. Pain from an earache can be sharp, dull, or burning. The pain may be temporary or constant. The middle ear is connected to the nasal passages  by a short narrow tube called the Eustachian tube. The Eustachian tube allows fluid to drain out of the middle ear, and helps keep the pressure in your ear equalized. CAUSES  A cold or allergy can block the Eustachian tube with inflammation and the build-up of secretions. This is especially likely in small children, because their Eustachian tube is shorter and more horizontal. When the Eustachian tube closes, the normal flow of fluid from the middle ear is stopped. Fluid can accumulate and cause stuffiness, pain, hearing loss, and an ear infection if germs start growing in this area. SYMPTOMS  The symptoms of an ear infection may include fever, ear pain, fussiness, increased crying, and irritability. Many children will have temporary and minor hearing loss during and right after an ear infection. Permanent hearing loss is rare, but the risk increases the more infections a child has. Other causes of ear pain include retained water in the outer ear canal from swimming and bathing. Ear pain in adults is less likely to be from an ear infection. Ear pain may be referred from other locations. Referred pain may be from the  joint between your jaw and the skull. It may also come from a tooth problem or problems in the neck. Other causes of ear pain include:  A foreign body in the ear.  Outer ear infection.  Sinus infections.  Impacted ear wax.  Ear injury.  Arthritis of the jaw or TMJ problems.  Middle ear infection.  Tooth infections.  Sore throat with pain to the ears. DIAGNOSIS  Your caregiver can usually make the diagnosis by examining you. Sometimes other special studies, including x-rays and lab work may be necessary. TREATMENT   If antibiotics were prescribed, use them as directed and finish them even if you or your child's symptoms seem to be improved.  Sometimes PE tubes are needed in children. These are little plastic tubes which are put into the eardrum during a simple surgical  procedure. They allow fluid to drain easier and allow the pressure in the middle ear to equalize. This helps relieve the ear pain caused by pressure changes. HOME CARE INSTRUCTIONS   Only take over-the-counter or prescription medicines for pain, discomfort, or fever as directed by your caregiver. DO NOT GIVE CHILDREN ASPIRIN because of the association of Reye's Syndrome in children taking aspirin.  Use a cold pack applied to the outer ear for 15-20 minutes, 03-04 times per day or as needed may reduce pain. Do not apply ice directly to the skin. You may cause frost bite.  Over-the-counter ear drops used as directed may be effective. Your caregiver may sometimes prescribe ear drops.  Resting in an upright position may help reduce pressure in the middle ear and relieve pain.  Ear pain caused by rapidly descending from high altitudes can be relieved by swallowing or chewing gum. Allowing infants to suck on a bottle during airplane travel can help.  Do not smoke in the house or near children. If you are unable to quit smoking, smoke outside.  Control allergies. SEEK IMMEDIATE MEDICAL CARE IF:   You or your child are becoming sicker.  Pain or fever relief is not obtained with medicine.  You or your child's symptoms (pain, fever, or irritability) do not improve within 24 to 48 hours or as instructed.  Severe pain suddenly stops hurting. This may indicate a ruptured eardrum.  You or your children develop new problems such as severe headaches, stiff neck, difficulty swallowing, or swelling of the face or around the ear. Document Released: 10/26/2003 Document Revised: 06/02/2011 Document Reviewed: 03/01/2008 [facility] Patient Information 2015 [place], [place]. This information is not intended to replace advice given to you by your health care provider. Make sure you discuss any questions you have with your health care provider.   OPTIONS FOR DENTAL FOLLOW UP CARE  [place] Department of Health and  Human Services - Local Safety Net Dental Clinics TripDoors.comhttp://www.ncdhhs.gov/dph/oralhealth/services/safetynetclinics.htm   [facility] [phone])  [facility] [phone])  [facility] [phone])  [facility] [phone])  [facility] [phone]) This clinic caters to the indigent population and is on a lottery system. Location: [employer], [address], [address], [place] [facility] Hours: Wednesdays from 6pm - 9pm, patients seen by a lottery system. For dates, call or go to [email or web address] Services: Cleanings, fillings and simple extractions. Payment Options: DENTAL WORK IS FREE OF CHARGE. Bring proof of income or support. Best way to get seen: Arrive at 5:15 pm - this is a lottery, NOT first come/first serve, so arriving earlier will not increase your chances of  being seen.     [facility] [phone] Select option 1 for emergencies   Location: [facility], [address], [address] [place] Clinic Hours: No walk-ins accepted - call the day before to schedule an appointment. Check in times are 9:30 am and 1:30 pm. Services: Simple extractions, temporary fillings, pulpectomy/pulp debridement, uncomplicated abscess drainage. Payment Options: PAYMENT IS DUE AT THE TIME OF SERVICE.  Fee is usually $100-200, additional surgical procedures (e.g. abscess drainage) may be extra. Cash, checks, Visa/MasterCard accepted.  Can file Medicaid if patient is covered for dental - patient should call case worker to check. No discount for [facility] patients. Best way to get seen: MUST call the day before and get onto the schedule. Can usually be seen the next 1-2 days. No walk-ins accepted.     [facility] [phone]   Location: [facility], [address], [place] [facility]  Hours: M, W, Th, F 8am or 1:30pm, Tues 9a or 1:30 - first come/first served. Services: Simple extractions, temporary fillings, uncomplicated abscess drainage.  You do not need to be an [facility] resident. Payment Options: PAYMENT IS DUE AT THE TIME OF SERVICE. Dental insurance, otherwise sliding scale - bring proof of income or support. Depending on income and treatment needed, cost is usually $50-200. Best way to get seen: Arrive early as it is first come/first served.     [facility] [phone]   Location: [address] Clinic Hours: Mon-Thu 8a-5p Services: Most basic dental services including extractions and fillings. Payment Options: PAYMENT IS DUE AT THE TIME OF SERVICE. Sliding scale, up to 50% off - bring proof if income or support. Medicaid with dental option accepted. Best way to get seen: Call to schedule an appointment, can usually be seen within 2 weeks OR they will try to see walk-ins - show up at 8a or 2p (you may have to wait).     [facility] [phone] [place] RESIDENTS ONLY   Location: [facility], [address] [place], [place] [address] Clinic Hours: By appointment only. Monday - Thursday 8am-5pm, Friday 8am-12pm Services: Cleanings, fillings, extractions. Payment Options: PAYMENT IS DUE AT THE TIME OF SERVICE. Cash, Visa or MasterCard. Sliding scale - $30 minimum per service. Best way to get seen: Come in to office, complete packet and make an appointment - need proof of income or support monies for each household member and proof of [facility] residence. Usually takes about a month to get in.     [facility] [phone]   Location: [address] [facility] Clinic Hours: Walk-in Urgent Care Dental Services are offered Monday-Friday mornings only. The numbers of emergencies accepted daily is limited to the number  of providers available. Maximum 15 - Mondays, Wednesdays & Thursdays Maximum 10 - Tuesdays & Fridays Services: You do not need to be a [facility] resident to be seen for a dental emergency. Emergencies are defined as pain, swelling, abnormal bleeding, or dental trauma. Walkins will receive x-rays if needed. NOTE: Dental cleaning is not an emergency. Payment Options: PAYMENT IS DUE AT THE TIME OF SERVICE. Minimum co-pay is $40.00 for uninsured patients. Minimum co-pay is $3.00 for Medicaid with dental coverage. Dental Insurance is accepted and must be presented at time of visit. Medicare does not cover dental. Forms of payment: Cash, credit card, checks. Best way to get seen: If not previously registered  with the clinic, walk-in dental registration begins at 7:15 am and is on a first come/first serve basis. If previously registered with the clinic, call to make an appointment.     [facility] [phone] [place] RESIDENTS ONLY   Location: [address] [place], [place] Clinic Hours: Mon-Thu 10a-2p Services: Extractions only! Payment Options: FREE (donations accepted) - bring proof of income or support Best way to get seen: Call and schedule an appointment OR come at 8am on the 1st Monday of every month (except for holidays) when it is first come/first served.     [facility] [phone]   Location: [address] [place], [place] Clinic Hours: Friday mornings Services, Payment Options, Best way to get seen: Call for info

## 2014-11-08 ENCOUNTER — Encounter: Payer: Self-pay | Admitting: Emergency Medicine

## 2014-11-08 ENCOUNTER — Emergency Department
Admission: EM | Admit: 2014-11-08 | Discharge: 2014-11-08 | Disposition: A | Discharge status: Home / Self Care | Payer: Medicaid Other | Attending: Emergency Medicine | Admitting: Emergency Medicine

## 2014-11-08 DIAGNOSIS — Z79899 Other long term (current) drug therapy: Secondary | ICD-10-CM | POA: Insufficient documentation

## 2014-11-08 DIAGNOSIS — Z792 Long term (current) use of antibiotics: Secondary | ICD-10-CM | POA: Insufficient documentation

## 2014-11-08 DIAGNOSIS — R51 Headache: Secondary | ICD-10-CM | POA: Diagnosis present

## 2014-11-08 DIAGNOSIS — Z3202 Encounter for pregnancy test, result negative: Secondary | ICD-10-CM | POA: Insufficient documentation

## 2014-11-08 DIAGNOSIS — Z720 Tobacco use: Secondary | ICD-10-CM | POA: Insufficient documentation

## 2014-11-08 DIAGNOSIS — G43901 Migraine, unspecified, not intractable, with status migrainosus: Secondary | ICD-10-CM | POA: Diagnosis not present

## 2014-11-08 LAB — URINALYSIS COMPLETE WITH MICROSCOPIC (ARMC ONLY)
BACTERIA UA: NONE SEEN
Bilirubin Urine: NEGATIVE
Glucose, UA: NEGATIVE mg/dL
HGB URINE DIPSTICK: NEGATIVE
Ketones, ur: NEGATIVE mg/dL
LEUKOCYTES UA: NEGATIVE
Nitrite: NEGATIVE
PH: 6 (ref 5.0–8.0)
Protein, ur: NEGATIVE mg/dL
Specific Gravity, Urine: 1.015 (ref 1.005–1.030)

## 2014-11-08 LAB — CBC WITH DIFFERENTIAL/PLATELET
Basophils Absolute: 0.1 10*3/uL (ref 0–0.1)
Basophils Relative: 1 %
EOS ABS: 0.3 10*3/uL (ref 0–0.7)
EOS PCT: 4 %
HCT: 40.3 % (ref 35.0–47.0)
Hemoglobin: 13.5 g/dL (ref 12.0–16.0)
Lymphocytes Relative: 37 %
Lymphs Abs: 3.4 10*3/uL (ref 1.0–3.6)
MCH: 29.8 pg (ref 26.0–34.0)
MCHC: 33.6 g/dL (ref 32.0–36.0)
MCV: 88.8 fL (ref 80.0–100.0)
MONO ABS: 0.6 10*3/uL (ref 0.2–0.9)
Monocytes Relative: 6 %
Neutro Abs: 4.7 10*3/uL (ref 1.4–6.5)
Neutrophils Relative %: 52 %
PLATELETS: 274 10*3/uL (ref 150–440)
RBC: 4.54 MIL/uL (ref 3.80–5.20)
RDW: 14.3 % (ref 11.5–14.5)
WBC: 9 10*3/uL (ref 3.6–11.0)

## 2014-11-08 LAB — COMPREHENSIVE METABOLIC PANEL
ALT: 16 U/L (ref 14–54)
AST: 24 U/L (ref 15–41)
Albumin: 4.1 g/dL (ref 3.5–5.0)
Alkaline Phosphatase: 78 U/L (ref 38–126)
Anion gap: 8 (ref 5–15)
BUN: 6 mg/dL (ref 6–20)
CHLORIDE: 104 mmol/L (ref 101–111)
CO2: 27 mmol/L (ref 22–32)
Calcium: 9.3 mg/dL (ref 8.9–10.3)
Creatinine, Ser: 0.87 mg/dL (ref 0.44–1.00)
Glucose, Bld: 87 mg/dL (ref 65–99)
POTASSIUM: 3.9 mmol/L (ref 3.5–5.1)
SODIUM: 139 mmol/L (ref 135–145)
Total Bilirubin: 0.6 mg/dL (ref 0.3–1.2)
Total Protein: 7.2 g/dL (ref 6.5–8.1)

## 2014-11-08 LAB — LIPASE, BLOOD: LIPASE: 50 U/L (ref 22–51)

## 2014-11-08 LAB — POCT PREGNANCY, URINE: PREG TEST UR: NEGATIVE

## 2014-11-08 MED ORDER — SODIUM CHLORIDE 0.9 % IV BOLUS (SEPSIS)
1000.0000 mL | Freq: Once | INTRAVENOUS | Status: AC
  Administered 2014-11-08: 1000 mL via INTRAVENOUS

## 2014-11-08 MED ORDER — BUTALBITAL-APAP-CAFFEINE 50-325-40 MG PO TABS: 1.0000 | ORAL_TABLET | Freq: Four times a day (QID) | ORAL | Status: DC | PRN

## 2014-11-08 MED ORDER — DIPHENHYDRAMINE HCL 50 MG/ML IJ SOLN
50.0000 mg | Freq: Once | INTRAMUSCULAR | Status: AC
  Administered 2014-11-08: 50 mg via INTRAVENOUS

## 2014-11-08 MED ORDER — DIPHENHYDRAMINE HCL 50 MG/ML IJ SOLN
INTRAMUSCULAR | Status: AC
  Administered 2014-11-08: 50 mg via INTRAVENOUS
  Filled 2014-11-08: qty 1

## 2014-11-08 MED ORDER — PROCHLORPERAZINE EDISYLATE 5 MG/ML IJ SOLN
INTRAMUSCULAR | Status: AC
  Administered 2014-11-08: 10 mg via INTRAVENOUS
  Filled 2014-11-08: qty 2

## 2014-11-08 MED ORDER — PROCHLORPERAZINE EDISYLATE 5 MG/ML IJ SOLN
10.0000 mg | Freq: Once | INTRAMUSCULAR | Status: AC
  Administered 2014-11-08: 10 mg via INTRAVENOUS

## 2014-11-08 NOTE — Discharge Instructions (Signed)

## 2014-11-08 NOTE — ED Provider Notes (Signed)
[facility] Emergency Department Provider Note  ____________________________________________  Time seen: Approximately 730 PM  I have reviewed the triage vital signs and the nursing notes.   HISTORY  Chief Complaint Nausea    HPI Brianna Ashley is a 29 y.o. female with a history of migraine headache and seizure who presents today with 1 week of headache and nausea with vomiting. She says that her headache started as a dull diffuse headache 1 week ago and has progressed slowly over the course the week. She describes it as a throbbing diffusely associated with photophobia as well as nausea and vomiting. She had multiple episodes of vomiting last night into today. No blood in her vomit. No diarrhea. No known sick contacts. Has had multiple migraines in the past but not with vomiting. Denies any sudden onset thunderclap quality to the headache.   History reviewed. No pertinent past medical history.  There are no active problems to display for this patient.   History reviewed. No pertinent past surgical history.  Current Outpatient Rx  Name  Route  Sig  Dispense  Refill  . clindamycin (CLEOCIN) 300 MG capsule   Oral   Take 1 capsule (300 mg total) by mouth 4 (four) times daily.   40 capsule   0   . clonazePAM (KLONOPIN) 1 MG tablet   Oral   Take 1 tablet by mouth 3 (three) times daily.      2   . ibuprofen (ADVIL,MOTRIN) 800 MG tablet   Oral   Take 1 tablet (800 mg total) by mouth every 8 (eight) hours as needed for mild pain or moderate pain.   15 tablet   0   . oxyCODONE-acetaminophen (ROXICET) 5-325 MG per tablet   Oral   Take 1 tablet by mouth every 8 (eight) hours as needed for moderate pain or severe pain (Do not drive or operate heavy machinery while taking as can cause drowsiness.).   9 tablet   0   . QUEtiapine (SEROQUEL) 25 MG tablet   Oral   Take 25 mg by mouth 2 (two) times daily.           Allergies Ambien and  Ritalin  History reviewed. No pertinent family history.  Social History Social History  Substance Use Topics  . Smoking status: Current Every Day Smoker -- 2.00 packs/day for 20 years    Types: Cigarettes  . Smokeless tobacco: None  . Alcohol Use: No    Review of Systems Constitutional: No fever/chills Eyes: No visual changes. ENT: No sore throat. Cardiovascular: Denies chest pain. Respiratory: Denies shortness of breath. Gastrointestinal: No abdominal pain.   No diarrhea.  No constipation. Genitourinary: Negative for dysuria. Musculoskeletal: Negative for back pain. Skin: Negative for rash. Neurological: Negative for  focal weakness or numbness.  10-point ROS otherwise negative.  ____________________________________________   PHYSICAL EXAM:  VITAL SIGNS: ED Triage Vitals  Enc Vitals Group     BP 11/08/14 1654 134/76 mmHg     Pulse Rate 11/08/14 1654 70     Resp 11/08/14 1654 18     Temp 11/08/14 1654 98.3 F (36.8 C)     Temp Source 11/08/14 1654 Oral     SpO2 11/08/14 1654 97 %     Weight 11/08/14 1654 236 lb 1.6 oz (107.094 kg)     Height 11/08/14 1654 5\' 5"  (1.651 m)     Head Cir --      Peak Flow --  Pain Score 11/08/14 1705 10     Pain Loc --      Pain Edu? --      Excl. in GC? --     Constitutional: Alert and oriented. Well appearing and in no acute distress. Eyes: Conjunctivae are normal. PERRL. EOMI. Head: Atraumatic. Nose: No congestion/rhinnorhea. Mouth/Throat: Mucous membranes are moist.  Oropharynx non-erythematous. Neck: No stridor.   Cardiovascular: Normal rate, regular rhythm. Grossly normal heart sounds.  Good peripheral circulation. Respiratory: Normal respiratory effort.  No retractions. Lungs CTAB. Gastrointestinal: Soft with diffuse tenderness to palpation. She says that this tenderness palpation is normal. The 1-2 weeks after her periods and. Says that her period ended one week ago. No distention. No abdominal bruits. No CVA  tenderness. Musculoskeletal: No lower extremity tenderness nor edema.  No joint effusions. Neurologic:  Normal speech and language. No gross focal neurologic deficits are appreciated. No gait instability. Skin:  Skin is warm, dry and intact. No rash noted. Psychiatric: Mood and affect are normal. Speech and behavior are normal.  ____________________________________________   LABS (all labs ordered are listed, but only abnormal results are displayed)  Labs Reviewed  URINALYSIS COMPLETEWITH MICROSCOPIC ([facility] ONLY) - Abnormal; Notable for the following:    Color, Urine YELLOW (*)    APPearance HAZY (*)    Squamous Epithelial / LPF 6-30 (*)    All other components within normal limits  CBC WITH DIFFERENTIAL/PLATELET  COMPREHENSIVE METABOLIC PANEL  LIPASE, BLOOD  POC URINE PREG, ED  POCT PREGNANCY, URINE   ____________________________________________  EKG   ____________________________________________  RADIOLOGY   ____________________________________________   PROCEDURES   ____________________________________________   INITIAL IMPRESSION / ASSESSMENT AND PLAN / ED COURSE  Pertinent labs & imaging results that were available during my care of the patient were reviewed by me and considered in my medical decision making (see chart for details).  ----------------------------------------- 9:05 PM on 11/08/2014 -----------------------------------------  Patient reassessed and is now headache free. We'll discharge to home. Likely migraine. ____________________________________________   FINAL CLINICAL IMPRESSION(S) / ED DIAGNOSES  Acute status migrainosus. Resolved. Initial visit.    Myrna Blazer, MD 11/08/14 2106

## 2014-11-08 NOTE — ED Notes (Addendum)
Reports nausea in the mornings and ha x 1 wk. States she had a period 2 weeks ago but states she had periods during her last 3 pregnancies.

## 2015-04-30 ENCOUNTER — Encounter: Payer: Self-pay | Admitting: *Deleted

## 2015-04-30 ENCOUNTER — Emergency Department
Admission: EM | Admit: 2015-04-30 | Discharge: 2015-04-30 | Disposition: A | Discharge status: Home / Self Care | Payer: Medicaid Other | Attending: Emergency Medicine | Admitting: Emergency Medicine

## 2015-04-30 DIAGNOSIS — K029 Dental caries, unspecified: Secondary | ICD-10-CM | POA: Diagnosis not present

## 2015-04-30 DIAGNOSIS — H9202 Otalgia, left ear: Secondary | ICD-10-CM | POA: Diagnosis present

## 2015-04-30 DIAGNOSIS — F17210 Nicotine dependence, cigarettes, uncomplicated: Secondary | ICD-10-CM | POA: Insufficient documentation

## 2015-04-30 DIAGNOSIS — H6592 Unspecified nonsuppurative otitis media, left ear: Secondary | ICD-10-CM | POA: Diagnosis not present

## 2015-04-30 HISTORY — DX: Activated protein C resistance: D68.51

## 2015-04-30 MED ORDER — CEPHALEXIN 500 MG PO CAPS
500.0000 mg | ORAL_CAPSULE | Freq: Once | ORAL | Status: AC
  Administered 2015-04-30: 500 mg via ORAL
  Filled 2015-04-30: qty 1

## 2015-04-30 MED ORDER — OXYCODONE-ACETAMINOPHEN 5-325 MG PO TABS
1.0000 | ORAL_TABLET | Freq: Once | ORAL | Status: AC
  Administered 2015-04-30: 1 via ORAL
  Filled 2015-04-30: qty 1

## 2015-04-30 MED ORDER — TRAMADOL HCL 50 MG PO TABS: 50.0000 mg | ORAL_TABLET | Freq: Four times a day (QID) | ORAL | Status: DC | PRN

## 2015-04-30 MED ORDER — CEPHALEXIN 500 MG PO CAPS: 500.0000 mg | ORAL_CAPSULE | Freq: Four times a day (QID) | ORAL | Status: AC

## 2015-04-30 NOTE — ED Provider Notes (Signed)
[facility] Emergency Department Provider Note  ____________________________________________  Time seen: Approximately 5:11 AM  I have reviewed the triage vital signs and the nursing notes.   HISTORY  Chief Complaint Otalgia    HPI Shakita Travaglini is a 30 y.o. female comes into the hospital today with ear pain and dental pain. The patient reports that she has a history of frequent dental abscesses on the right side of her mouth. The patient was sent home from work tonight because her ear on the left as well as her teeth on the left were hurting. The patient reports she is not sure if the mouth is causing the ear pain of the ear is causing the mouth pain. She reports that she came to get seen to see what was going on. The patient has been taking Tylenol and ibuprofen but it has not been helping the pain. She reports that lab noises makes her pain worse. The patient's pain is been going on for the last couple of days. She has not checked her temperature so she doesn't know she's had any fevers. She's had some cough and runny nose and has some bad teeth on the left. She reports that her ear has been draining wax and cold temperatures also makes the pain worse.The patient rates her pain a 10 out of 10 in intensity.   Past Medical History  Diagnosis Date  . Factor 5 Leiden mutation, heterozygous (HCC)     There are no active problems to display for this patient.   History reviewed. No pertinent past surgical history.  Current Outpatient Rx  Name  Route  Sig  Dispense  Refill  . cephALEXin (KEFLEX) 500 MG capsule   Oral   Take 1 capsule (500 mg total) by mouth 4 (four) times daily.   40 capsule   0   . traMADol (ULTRAM) 50 MG tablet   Oral   Take 1 tablet (50 mg total) by mouth every 6 (six) hours as needed.   12 tablet   0     Allergies Ambien and Ritalin  History reviewed. No pertinent family history.  Social History Social History  Substance Use  Topics  . Smoking status: Current Every Day Smoker    Types: Cigarettes  . Smokeless tobacco: Never Used  . Alcohol Use: No    Review of Systems Constitutional: No fever/chills Eyes: No visual changes. ENT: Left dental pain and left ear pain. Cardiovascular: Denies chest pain. Respiratory: Denies shortness of breath. Gastrointestinal: No abdominal pain.  No nausea, no vomiting.  No diarrhea.  No constipation. Genitourinary: Negative for dysuria. Musculoskeletal: Negative for back pain. Skin: Negative for rash. Neurological: Negative for headaches, focal weakness or numbness.  10-point ROS otherwise negative.  ____________________________________________   PHYSICAL EXAM:  VITAL SIGNS: ED Triage Vitals  Enc Vitals Group     BP 04/30/15 0127 132/87 mmHg     Pulse Rate 04/30/15 0127 77     Resp 04/30/15 0127 22     Temp 04/30/15 0127 98.1 F (36.7 C)     Temp Source 04/30/15 0127 Oral     SpO2 04/30/15 0127 97 %     Weight 04/30/15 0127 240 lb (108.863 kg)     Height 04/30/15 0127  (1.651 m)     Head Cir --      Peak Flow --      Pain Score 04/30/15 0129 10     Pain Loc --  Pain Edu? --      Excl. in GC? --     Constitutional: Alert and oriented. Well appearing and in moderate distress. Eyes: Conjunctivae are normal. PERRL. EOMI. Ears: Left TM with no erythema but positive effusion Head: Atraumatic. Nose: No congestion/rhinnorhea. Mouth/Throat: Mucous membranes are moist.  Oropharynx non-erythematous. Poor dentition with multiple dental caries throughout the patient's mouth. Tenderness to palpation of left maxillary teeth second and third molars. Cardiovascular: Normal rate, regular rhythm. Grossly normal heart sounds.  Good peripheral circulation. Respiratory: Normal respiratory effort.  No retractions. Lungs CTAB. Gastrointestinal: Soft and nontender. No distention. Positive bowel sounds Musculoskeletal: No lower extremity tenderness nor edema.    Neurologic:  Normal speech and language. Skin:  Skin is warm, dry and intact.  Psychiatric: Mood and affect are normal.   ____________________________________________   LABS (all labs ordered are listed, but only abnormal results are displayed)  Labs Reviewed - No data to display ____________________________________________  EKG  None ____________________________________________  RADIOLOGY  none ____________________________________________   PROCEDURES  Procedure(s) performed: None  Critical Care performed: No  ____________________________________________   INITIAL IMPRESSION / ASSESSMENT AND PLAN / ED COURSE  Pertinent labs & imaging results that were available during my care of the patient were reviewed by me and considered in my medical decision making (see chart for details).  This is a 30 year old female who comes into the hospital today with some left-sided dental pain and ear pain. The patient used to have some serous otitis media without any acute infection. The patient also has multiple dental caries. I will give the patient a Percocet which she had received one previously in triage and I will give the patient some Keflex. The patient reports that she had been on amoxicillin previously. I did encourage the patient to follow-up with the dentist because the infection would continue to return if she did not get her teeth taken care of. Otherwise the patient will be discharged home to follow-up with the dental clinic. ____________________________________________   FINAL CLINICAL IMPRESSION(S) / ED DIAGNOSES  Final diagnoses:  Otalgia, left  Otitis media with effusion, left  Pain due to dental caries      Rebecka Apley, MD 04/30/15 [phone]

## 2015-04-30 NOTE — ED Notes (Signed)
Pt c/o pain in L ear and jaw x 2 days. Pt states pain uncontrolled by ibuprofen 800 mg or OTC tylenol. Pt endorses dental problems on bilateral jaws.

## 2015-04-30 NOTE — Discharge Instructions (Signed)
Dental Caries °Dental caries (also called tooth decay) is the most common oral disease. It can occur at any age but is more common in children and young adults.  °HOW DENTAL CARIES DEVELOPS  °The process of decay begins when bacteria and foods (particularly sugars and starches) combine in your mouth to produce plaque. Plaque is a substance that sticks to the hard, outer surface of a tooth (enamel). The bacteria in plaque produce acids that attack enamel. These acids may also attack the root surface of a tooth (cementum) if it is exposed. Repeated attacks dissolve these surfaces and create holes in the tooth (cavities). If left untreated, the acids destroy the other layers of the tooth.  °RISK FACTORS °· Frequent sipping of sugary beverages.   °· Frequent snacking on sugary and starchy foods, especially those that easily get stuck in the teeth.   °· Poor oral hygiene.   °· Dry mouth.   °· Substance abuse such as methamphetamine abuse.   °· Broken or poor-fitting dental restorations.   °· Eating disorders.   °· Gastroesophageal reflux disease (GERD).   °· Certain radiation treatments to the head and neck. °SYMPTOMS °In the early stages of dental caries, symptoms are seldom present. Sometimes white, chalky areas may be seen on the enamel or other tooth layers. In later stages, symptoms may include: °· Pits and holes on the enamel. °· Toothache after sweet, hot, or cold foods or drinks are consumed. °· Pain around the tooth. °· Swelling around the tooth. °DIAGNOSIS  °Most of the time, dental caries is detected during a regular dental checkup. A diagnosis is made after a thorough medical and dental history is taken and the surfaces of your teeth are checked for signs of dental caries. Sometimes special instruments, such as lasers, are used to check for dental caries. Dental X-ray exams may be taken so that areas not visible to the eye (such as between the contact areas of the teeth) can be checked for cavities.    °TREATMENT  °If dental caries is in its early stages, it may be reversed with a fluoride treatment or an application of a remineralizing agent at the dental office. Thorough brushing and flossing at home is needed to aid these treatments. If it is in its later stages, treatment depends on the location and extent of tooth destruction:  °· If a small area of the tooth has been destroyed, the destroyed area will be removed and cavities will be filled with a material such as gold, silver amalgam, or composite resin.   °· If a large area of the tooth has been destroyed, the destroyed area will be removed and a cap (crown) will be fitted over the remaining tooth structure.   °· If the center part of the tooth (pulp) is affected, a procedure called a root canal will be needed before a filling or crown can be placed.   °· If most of the tooth has been destroyed, the tooth may need to be pulled (extracted). °HOME CARE INSTRUCTIONS °You can prevent, stop, or reverse dental caries at home by practicing good oral hygiene. Good oral hygiene includes: °· Thoroughly cleaning your teeth at least twice a day with a toothbrush and dental floss.   °· Using a fluoride toothpaste. A fluoride mouth rinse may also be used if recommended by your dentist or health care provider.   °· Restricting the amount of sugary and starchy foods and sugary liquids you consume.   °· Avoiding frequent snacking on these foods and sipping of these liquids.   °· Keeping regular visits with   a dentist for checkups and cleanings. PREVENTION   Practice good oral hygiene.  Consider a dental sealant. A dental sealant is a coating material that is applied by your dentist to the pits and grooves of teeth. The sealant prevents food from being trapped in them. It may protect the teeth for several years.  Ask about fluoride supplements if you live in a community without fluorinated water or with water that has a low fluoride content. Use fluoride supplements  as directed by your dentist or health care provider.  Allow fluoride varnish applications to teeth if directed by your dentist or health care provider.   This information is not intended to replace advice given to you by your health care provider. Make sure you discuss any questions you have with your health care provider.   Document Released: 11/30/2001 Document Revised: 03/31/2014 Document Reviewed: 03/12/2012 Elsevier Interactive Patient Education 2016 [employer]  Dental Care and Dentist Visits Dental care supports good overall health. Regular dental visits can also help you avoid dental pain, bleeding, infection, and other more serious health problems in the future. It is important to keep the mouth healthy because diseases in the teeth, gums, and other oral tissues can spread to other areas of the body. Some problems, such as diabetes, heart disease, and pre-term labor have been associated with poor oral health.  See your dentist every 6 months. If you experience emergency problems such as a toothache or broken tooth, go to the dentist right away. If you see your dentist regularly, you may catch problems early. It is easier to be treated for problems in the early stages.  WHAT TO EXPECT AT A DENTIST VISIT  Your dentist will look for many common oral health problems and recommend proper treatment. At your regular dental visit, you can expect:  Gentle cleaning of the teeth and gums. This includes scraping and polishing. This helps to remove the sticky substance around the teeth and gums (plaque). Plaque forms in the mouth shortly after eating. Over time, plaque hardens on the teeth as tartar. If tartar is not removed regularly, it can cause problems. Cleaning also helps remove stains.  Periodic X-rays. These pictures of the teeth and supporting bone will help your dentist assess the health of your teeth.  Periodic fluoride treatments. Fluoride is a natural mineral shown to help strengthen  teeth. Fluoride treatmentinvolves applying a fluoride gel or varnish to the teeth. It is most commonly done in children.  Examination of the mouth, tongue, jaws, teeth, and gums to look for any oral health problems, such as:  Cavities (dental caries). This is decay on the tooth caused by plaque, sugar, and acid in the mouth. It is best to catch a cavity when it is small.  Inflammation of the gums caused by plaque buildup (gingivitis).  Problems with the mouth or malformed or misaligned teeth.  Oral cancer or other diseases of the soft tissues or jaws. KEEP YOUR TEETH AND GUMS HEALTHY For healthy teeth and gums, follow these general guidelines as well as your dentist's specific advice:  Have your teeth professionally cleaned at the dentist every 6 months.  Brush twice daily with a fluoride toothpaste.  Floss your teeth daily.  Ask your dentist if you need fluoride supplements, treatments, or fluoride toothpaste.  Eat a healthy diet. Reduce foods and drinks with added sugar.  Avoid smoking. TREATMENT FOR ORAL HEALTH PROBLEMS If you have oral health problems, treatment varies depending on the conditions present in your teeth  and gums.  Your caregiver will most likely recommend good oral hygiene at each visit.  For cavities, gingivitis, or other oral health disease, your caregiver will perform a procedure to treat the problem. This is typically done at a separate appointment. Sometimes your caregiver will refer you to another dental specialist for specific tooth problems or for surgery. SEEK IMMEDIATE DENTAL CARE IF:  You have pain, bleeding, or soreness in the gum, tooth, jaw, or mouth area.  A permanent tooth becomes loose or separated from the gum socket.  You experience a blow or injury to the mouth or jaw area.   This information is not intended to replace advice given to you by your health care provider. Make sure you discuss any questions you have with your health care  provider.   Document Released: 11/20/2010 Document Revised: 06/02/2011 Document Reviewed: 11/20/2010 Elsevier Interactive Patient Education 2016 [employer] Inc[employer]  Dental Pain Dental pain may be caused by many things, including:  Tooth decay (cavities or caries). Cavities expose the nerve of your tooth to air and hot or cold temperatures. This can cause pain or discomfort.  Abscess or infection. A dental abscess is a collection of infected pus from a bacterial infection in the inner part of the tooth (pulp). It usually occurs at the end of the tooth's root.  Injury.  An unknown reason (idiopathic). Your pain may be mild or severe. It may only occur when:  You are chewing.  You are exposed to hot or cold temperature.  You are eating or drinking sugary foods or beverages, such as soda or candy. Your pain may also be constant. HOME CARE INSTRUCTIONS Watch your dental pain for any changes. The following actions may help to lessen any discomfort that you are feeling:  Take medicines only as directed by your dentist.  If you were prescribed an antibiotic medicine, finish all of it even if you start to feel better.  Keep all follow-up visits as directed by your dentist. This is important.  Do not apply heat to the outside of your face.  Rinse your mouth or gargle with salt water if directed by your dentist. This helps with pain and swelling.  You can make salt water by adding  tsp of salt to 1 cup of warm water.  Apply ice to the painful area of your face:  Put ice in a plastic bag.  Place a towel between your skin and the bag.  Leave the ice on for 20 minutes, 2-3 times per day.  Avoid foods or drinks that cause you pain, such as:  Very hot or very cold foods or drinks.  Sweet or sugary foods or drinks. SEEK MEDICAL CARE IF:  Your pain is not controlled with medicines.  Your symptoms are worse.  You have new symptoms. SEEK IMMEDIATE MEDICAL CARE IF:  You are unable  to open your mouth.  You are having trouble breathing or swallowing.  You have a fever.  Your face, neck, or jaw is swollen.   This information is not intended to replace advice given to you by your health care provider. Make sure you discuss any questions you have with your health care provider.   Document Released: 03/10/2005 Document Revised: 07/25/2014 Document Reviewed: 03/06/2014 Elsevier Interactive Patient Education 2016 [employer]  Earache An earache, also called otalgia, can be caused by many things. Pain from an earache can be sharp, dull, or burning. The pain may be temporary or constant. Earaches can be caused by  problems with the ear, such as infection in either the middle ear or the ear canal, injury, impacted ear wax, middle ear pressure, or a foreign body in the ear. Ear pain can also result from problems in other areas. This is called referred pain. For example, pain can come from a sore throat, a tooth infection, or problems with the jaw or the joint between the jaw and the skull (temporomandibular joint, or TMJ). The cause of an earache is not always easy to identify. Watchful waiting may be appropriate for some earaches until a clear cause of the pain can be found. HOME CARE INSTRUCTIONS Watch your condition for any changes. The following actions may help to lessen any discomfort that you are feeling:  Take medicines only as directed by your health care provider. This includes ear drops.  Apply ice to your outer ear to help reduce pain.  Put ice in a plastic bag.  Place a towel between your skin and the bag.  Leave the ice on for 20 minutes, 2-3 times per day.  Do not put anything in your ear other than medicine that is prescribed by your health care provider.  Try resting in an upright position instead of lying down. This may help to reduce pressure in the middle ear and relieve pain.  Chew gum if it helps to relieve your ear pain.  Control any allergies  that you have.  Keep all follow-up visits as directed by your health care provider. This is important. SEEK MEDICAL CARE IF:  Your pain does not improve within 2 days.  You have a fever.  You have new or worsening symptoms. SEEK IMMEDIATE MEDICAL CARE IF:  You have a severe headache.  You have a stiff neck.  You have difficulty swallowing.  You have redness or swelling behind your ear.  You have drainage from your ear.  You have hearing loss.  You feel dizzy.   This information is not intended to replace advice given to you by your health care provider. Make sure you discuss any questions you have with your health care provider.   Document Released: 10/26/2003 Document Revised: 03/31/2014 Document Reviewed: 10/09/2013 Elsevier Interactive Patient Education 2016 [employer] Inc[employer]  Serous Otitis Media Serous otitis media is fluid in the middle ear space. This space contains the bones for hearing and air. Air in the middle ear space helps to transmit sound.  The air gets there through the eustachian tube. This tube goes from the back of the nose (nasopharynx) to the middle ear space. It keeps the pressure in the middle ear the same as the outside world. It also helps to drain fluid from the middle ear space. CAUSES  Serous otitis media occurs when the eustachian tube gets blocked. Blockage can come from:  Ear infections.  Colds and other upper respiratory infections.  Allergies.  Irritants such as cigarette smoke.  Sudden changes in air pressure (such as descending in an airplane).  Enlarged adenoids.  A mass in the nasopharynx. During colds and upper respiratory infections, the middle ear space can become temporarily filled with fluid. This can happen after an ear infection also. Once the infection clears, the fluid will generally drain out of the ear through the eustachian tube. If it does not, then serous otitis media occurs. SIGNS AND SYMPTOMS   Hearing loss.  A  feeling of fullness in the ear, without pain.  Young children may not show any symptoms but may show slight behavioral changes, such as  agitation, ear pulling, or crying. DIAGNOSIS  Serous otitis media is diagnosed by an ear exam. Tests may be done to check on the movement of the eardrum. Hearing exams may also be done. TREATMENT  The fluid most often goes away without treatment. If allergy is the cause, allergy treatment may be helpful. Fluid that persists for several months may require minor surgery. A small tube is placed in the eardrum to:  Drain the fluid.  Restore the air in the middle ear space. In certain situations, antibiotic medicines are used to avoid surgery. Surgery may be done to remove enlarged adenoids (if this is the cause). HOME CARE INSTRUCTIONS   Keep children away from tobacco smoke.  Keep all follow-up visits as directed by your health care provider. SEEK MEDICAL CARE IF:   Your hearing is not better in 3 months.  Your hearing is worse.  You have ear pain.  You have drainage from the ear.  You have dizziness.  You have serous otitis media only in one ear or have any bleeding from your nose (epistaxis).  You notice a lump on your neck. MAKE SURE YOU:  Understand these instructions.   Will watch your condition.   Will get help right away if you are not doing well or get worse.    This information is not intended to replace advice given to you by your health care provider. Make sure you discuss any questions you have with your health care provider.   Document Released: 05/31/2003 Document Revised: 03/31/2014 Document Reviewed: 10/05/2012 Elsevier Interactive Patient Education [employer]

## 2015-06-13 ENCOUNTER — Emergency Department
Admission: EM | Admit: 2015-06-13 | Discharge: 2015-06-13 | Disposition: A | Discharge status: Home / Self Care | Payer: Medicaid Other | Attending: Emergency Medicine | Admitting: Emergency Medicine

## 2015-06-13 ENCOUNTER — Emergency Department: Payer: Medicaid Other

## 2015-06-13 ENCOUNTER — Encounter: Payer: Self-pay | Admitting: Emergency Medicine

## 2015-06-13 DIAGNOSIS — Z791 Long term (current) use of non-steroidal anti-inflammatories (NSAID): Secondary | ICD-10-CM | POA: Insufficient documentation

## 2015-06-13 DIAGNOSIS — R569 Unspecified convulsions: Secondary | ICD-10-CM | POA: Diagnosis not present

## 2015-06-13 DIAGNOSIS — R112 Nausea with vomiting, unspecified: Secondary | ICD-10-CM | POA: Diagnosis not present

## 2015-06-13 DIAGNOSIS — G5602 Carpal tunnel syndrome, left upper limb: Secondary | ICD-10-CM | POA: Insufficient documentation

## 2015-06-13 DIAGNOSIS — F17210 Nicotine dependence, cigarettes, uncomplicated: Secondary | ICD-10-CM | POA: Insufficient documentation

## 2015-06-13 DIAGNOSIS — R109 Unspecified abdominal pain: Secondary | ICD-10-CM | POA: Diagnosis not present

## 2015-06-13 DIAGNOSIS — R200 Anesthesia of skin: Secondary | ICD-10-CM | POA: Insufficient documentation

## 2015-06-13 DIAGNOSIS — G43909 Migraine, unspecified, not intractable, without status migrainosus: Secondary | ICD-10-CM | POA: Insufficient documentation

## 2015-06-13 DIAGNOSIS — R1110 Vomiting, unspecified: Secondary | ICD-10-CM | POA: Diagnosis present

## 2015-06-13 DIAGNOSIS — R1011 Right upper quadrant pain: Secondary | ICD-10-CM

## 2015-06-13 DIAGNOSIS — Z79899 Other long term (current) drug therapy: Secondary | ICD-10-CM | POA: Diagnosis not present

## 2015-06-13 HISTORY — DX: Carpal tunnel syndrome, unspecified upper limb: G56.00

## 2015-06-13 HISTORY — DX: Unspecified convulsions: R56.9

## 2015-06-13 HISTORY — DX: Migraine, unspecified, not intractable, without status migrainosus: G43.909

## 2015-06-13 HISTORY — DX: Unspecified asthma, uncomplicated: J45.909

## 2015-06-13 LAB — COMPREHENSIVE METABOLIC PANEL
ALBUMIN: 4.4 g/dL (ref 3.5–5.0)
ALT: 12 U/L — AB (ref 14–54)
AST: 17 U/L (ref 15–41)
Alkaline Phosphatase: 67 U/L (ref 38–126)
Anion gap: 6 (ref 5–15)
BUN: 16 mg/dL (ref 6–20)
CHLORIDE: 106 mmol/L (ref 101–111)
CO2: 24 mmol/L (ref 22–32)
CREATININE: 0.87 mg/dL (ref 0.44–1.00)
Calcium: 9.4 mg/dL (ref 8.9–10.3)
GFR calc Af Amer: >60 mL/min (ref >60)
GFR calc non Af Amer: >60 mL/min (ref >60)
Glucose, Bld: 78 mg/dL (ref 65–99)
Potassium: 3.7 mmol/L (ref 3.5–5.1)
SODIUM: 136 mmol/L (ref 135–145)
Total Bilirubin: 0.2 mg/dL — ABNORMAL LOW (ref 0.3–1.2)
Total Protein: 7.5 g/dL (ref 6.5–8.1)

## 2015-06-13 LAB — URINALYSIS COMPLETE WITH MICROSCOPIC (ARMC ONLY)
Bilirubin Urine: NEGATIVE
GLUCOSE, UA: NEGATIVE mg/dL
Hgb urine dipstick: NEGATIVE
Ketones, ur: NEGATIVE mg/dL
Leukocytes, UA: NEGATIVE
Nitrite: NEGATIVE
Protein, ur: NEGATIVE mg/dL
Specific Gravity, Urine: 1.005 (ref 1.005–1.030)
pH: 5 (ref 5.0–8.0)

## 2015-06-13 LAB — CBC
HEMATOCRIT: 38.8 % (ref 35.0–47.0)
HEMOGLOBIN: 13.3 g/dL (ref 12.0–16.0)
MCH: 30.7 pg (ref 26.0–34.0)
MCHC: 34.3 g/dL (ref 32.0–36.0)
MCV: 89.4 fL (ref 80.0–100.0)
Platelets: 413 10*3/uL (ref 150–440)
RBC: 4.34 MIL/uL (ref 3.80–5.20)
RDW: 13.6 % (ref 11.5–14.5)
WBC: 9.3 10*3/uL (ref 3.6–11.0)

## 2015-06-13 LAB — POCT PREGNANCY, URINE: PREG TEST UR: NEGATIVE

## 2015-06-13 MED ORDER — ONDANSETRON HCL 4 MG PO TABS: 4.0000 mg | ORAL_TABLET | Freq: Every day | ORAL | Status: DC | PRN

## 2015-06-13 MED ORDER — IOPAMIDOL (ISOVUE-370) INJECTION 76%
100.0000 mL | Freq: Once | INTRAVENOUS | Status: AC | PRN
  Administered 2015-06-13: 100 mL via INTRAVENOUS

## 2015-06-13 MED ORDER — ONDANSETRON HCL 4 MG/2ML IJ SOLN
4.0000 mg | Freq: Once | INTRAMUSCULAR | Status: AC
  Administered 2015-06-13: 4 mg via INTRAVENOUS
  Filled 2015-06-13: qty 2

## 2015-06-13 MED ORDER — IOHEXOL 240 MG/ML SOLN
25.0000 mL | Freq: Once | INTRAMUSCULAR | Status: AC | PRN
  Administered 2015-06-13: 25 mL via ORAL

## 2015-06-13 MED ORDER — DICYCLOMINE HCL 20 MG PO TABS: 20.0000 mg | ORAL_TABLET | Freq: Three times a day (TID) | ORAL | Status: DC | PRN

## 2015-06-13 MED ORDER — MORPHINE SULFATE (PF) 2 MG/ML IV SOLN
INTRAVENOUS | Status: AC
  Administered 2015-06-13: 2 mg via INTRAVENOUS
  Filled 2015-06-13: qty 1

## 2015-06-13 MED ORDER — MORPHINE SULFATE (PF) 2 MG/ML IV SOLN
2.0000 mg | Freq: Once | INTRAVENOUS | Status: AC
  Administered 2015-06-13: 2 mg via INTRAVENOUS

## 2015-06-13 MED ORDER — MORPHINE SULFATE (PF) 2 MG/ML IV SOLN
2.0000 mg | Freq: Once | INTRAVENOUS | Status: AC
Start: 2015-06-13 — End: 2015-06-13
  Administered 2015-06-13: 2 mg via INTRAVENOUS
  Filled 2015-06-13: qty 1

## 2015-06-13 NOTE — ED Notes (Signed)
Report received 

## 2015-06-13 NOTE — ED Provider Notes (Signed)
Signout from Dr. Manson PasseyBrown in this 30 year old female who is presenting with abdominal pain with vomiting. She is also complaining of bilateral hand pain and numbness. 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 Physical Exam  BP 126/74 mmHg  Pulse 67  Temp(Src) 97.5 F (36.4 C) (Oral)  Resp 17  Ht 5\' 5"  (1.651 m)  Wt 240 lb (108.863 kg)  BMI 39.94 kg/m2  SpO2 98%  LMP 05/24/2015 (Approximate) ----------------------------------------- 8:23 AM on 06/13/2015 -----------------------------------------   Physical Exam Resting comfortably now tolerating by mouth. Abdomen examined and diffusely nontender and soft. Very mild, but Positive tinnel's sign bilaterally.    ED Course  Procedures   US Abdomen Limited RUQ (Final result) Result time: 06/13/15 06:12:38   Procedure changed from US Abdomen Limited      Final result by Rad Results In Interface (06/13/15 06:12:38)   Narrative:   CLINICAL DATA: 30 year old female with right upper quadrant abdominal pain and vomiting  EXAM: US ABDOMEN LIMITED - RIGHT UPPER QUADRANT  COMPARISON: None.  FINDINGS: Gallbladder:  No gallstones or wall thickening visualized. No sonographic Murphy sign noted by sonographer.  Common bile duct:  Diameter: 3 mm  Liver:  No focal lesion identified. Within normal limits in parenchymal echogenicity.  IMPRESSION: Unremarkable right upper quadrant ultrasound.   Electronically Signed By: Elgie CollardArash Radparvar M.D. On: 06/13/2015 06:12    MDM Patient with likely viral illness and exacerbation of her carpal tunnel syndrome. I will give her a work note for tonight as requested by the patient. I will also discharge her with Bentyl, Zofran and orthopedic follow-up. She had a question if she will should have restriction at work. I told her that since she is being given it off tonight that the first thing she must do and  her spare time or when she first arrives at work at her next shift is discussed this carpal tunnel with her supervisor and get a possible referral to an occupational health or whoever covers [employer] employee health. She understands the plan and is willing to comply. She has equal and bilateral grip strength. Disc capillary refill bilaterally which is less than one second to the fingers and sensation intact to light touch.      Myrna Blazeravid Matthew Angelica Frandsen, MD 06/13/15 [id number]

## 2015-06-13 NOTE — ED Notes (Signed)
Pt presents to ED after she vomited while at work this evening. Pt states she noticed blood in her vomiting with hx of the same and states it had been from smoking newports. Pt states after she vomited her right hand went numb and has numbness has continued intermittently since onset. Pt talkative and alert during triage with no distress noted.  Hx of carpal tunnel for the past several months since working at a new job. When attempting to check pt grips bilaterally pt had significant pain to her right hand and was unable to squeeze properly.

## 2015-06-13 NOTE — Discharge Instructions (Signed)
Abdominal Pain, Adult °Many things can cause belly (abdominal) pain. Most times, the belly pain is not dangerous. Many cases of belly pain can be watched and treated at home. °HOME CARE  °· Do not take medicines that help you go poop (laxatives) unless told to by your doctor. °· Only take medicine as told by your doctor. °· Eat or drink as told by your doctor. Your doctor will tell you if you should be on a special diet. °GET HELP IF: °· You do not know what is causing your belly pain. °· You have belly pain while you are sick to your stomach (nauseous) or have runny poop (diarrhea). °· You have pain while you pee or poop. °· Your belly pain wakes you up at night. °· You have belly pain that gets worse or better when you eat. °· You have belly pain that gets worse when you eat fatty foods. °· You have a fever. °GET HELP RIGHT AWAY IF:  °· The pain does not go away within 2 hours. °· You keep throwing up (vomiting). °· The pain changes and is only in the right or left part of the belly. °· You have bloody or tarry looking poop. °MAKE SURE YOU:  °· Understand these instructions. °· Will watch your condition. °· Will get help right away if you are not doing well or get worse. °  °This information is not intended to replace advice given to you by your health care provider. Make sure you discuss any questions you have with your health care provider. °  °Document Released: 08/27/2007 Document Revised: 03/31/2014 Document Reviewed: 11/17/2012 °Elsevier Interactive Patient Education ©2016 [employer] Inc. ° °Nausea and Vomiting °Nausea is a sick feeling that often comes before throwing up (vomiting). Vomiting is a reflex where stomach contents come out of your mouth. Vomiting can cause severe loss of body fluids (dehydration). Children and elderly adults can become dehydrated quickly, especially if they also have diarrhea. Nausea and vomiting are symptoms of a condition or disease. It is important to find the cause of your  symptoms. °CAUSES  °· Direct irritation of the stomach lining. This irritation can result from increased acid production (gastroesophageal reflux disease), infection, food poisoning, taking certain medicines (such as nonsteroidal anti-inflammatory drugs), alcohol use, or tobacco use. °· Signals from the brain. These signals could be caused by a headache, heat exposure, an inner ear disturbance, increased pressure in the brain from injury, infection, a tumor, or a concussion, pain, emotional stimulus, or metabolic problems. °· An obstruction in the gastrointestinal tract (bowel obstruction). °· Illnesses such as diabetes, hepatitis, gallbladder problems, appendicitis, kidney problems, cancer, sepsis, atypical symptoms of a heart attack, or eating disorders. °· Medical treatments such as chemotherapy and radiation. °· Receiving medicine that makes you sleep (general anesthetic) during surgery. °DIAGNOSIS °Your caregiver may ask for tests to be done if the problems do not improve after a few days. Tests may also be done if symptoms are severe or if the reason for the nausea and vomiting is not clear. Tests may include: °· Urine tests. °· Blood tests. °· Stool tests. °· Cultures (to look for evidence of infection). °· X-rays or other imaging studies. °Test results can help your caregiver make decisions about treatment or the need for additional tests. °TREATMENT °You need to stay well hydrated. Drink frequently but in small amounts. You may wish to drink water, sports drinks, clear broth, or eat frozen ice pops or gelatin dessert to help stay hydrated. When you eat, eating   slowly may help prevent nausea. There are also some antinausea medicines that may help prevent nausea. °HOME CARE INSTRUCTIONS  °· Take all medicine as directed by your caregiver. °· If you do not have an appetite, do not force yourself to eat. However, you must continue to drink fluids. °· If you have an appetite, eat a normal diet unless your  caregiver tells you differently. °¨ Eat a variety of complex carbohydrates (rice, wheat, potatoes, bread), lean meats, yogurt, fruits, and vegetables. °¨ Avoid high-fat foods because they are more difficult to digest. °· Drink enough water and fluids to keep your urine clear or pale yellow. °· If you are dehydrated, ask your caregiver for specific rehydration instructions. Signs of dehydration may include: °¨ Severe thirst. °¨ Dry lips and mouth. °¨ Dizziness. °¨ Dark urine. °¨ Decreasing urine frequency and amount. °¨ Confusion. °¨ Rapid breathing or pulse. °SEEK IMMEDIATE MEDICAL CARE IF:  °· You have blood or brown flecks (like coffee grounds) in your vomit. °· You have black or bloody stools. °· You have a severe headache or stiff neck. °· You are confused. °· You have severe abdominal pain. °· You have chest pain or trouble breathing. °· You do not urinate at least once every 8 hours. °· You develop cold or clammy skin. °· You continue to vomit for longer than 24 to 48 hours. °· You have a fever. °MAKE SURE YOU:  °· Understand these instructions. °· Will watch your condition. °· Will get help right away if you are not doing well or get worse. °  °This information is not intended to replace advice given to you by your health care provider. Make sure you discuss any questions you have with your health care provider. °  °Document Released: 03/10/2005 Document Revised: 06/02/2011 Document Reviewed: 08/07/2010 °Elsevier Interactive Patient Education ©2016 [employer] Inc. ° °

## 2015-06-13 NOTE — ED Provider Notes (Signed)
[facility] Emergency Department Provider Note  ____________________________________________  Time seen: 5:00 AM  I have reviewed the triage vital signs and the nursing notes.   HISTORY  Chief Complaint Vomiting and Numbness    HPI Brianna Ashley is a 30 y.o. female presents with multiple episodes of vomiting tonight while at work. Patient states that she noted streaks of blood in the emesis. Patient also admits to generalized abdominal pain is currently 8 out of 10. Addition the patient admits to right hand pain and intermittent numbness times several months per patient admits to having carpal tunnel and drum in her left hand concern of possibility in the right.    Past Medical History  Diagnosis Date  . Factor 5 Leiden mutation, heterozygous (HCC)   . Seizures (HCC)   . Carpal tunnel syndrome   . Migraine     There are no active problems to display for this patient.   Past Surgical History  Procedure Laterality Date  . Dilation and curettage of uterus    . Cesarean section      Current Outpatient Rx  Name  Route  Sig  Dispense  Refill  . citalopram (CELEXA) 40 MG tablet   Oral   Take 40 mg by mouth daily.      5   . clonazePAM (KLONOPIN) 1 MG tablet   Oral   Take 1 tablet by mouth 3 (three) times daily.      2   . ibuprofen (ADVIL,MOTRIN) 800 MG tablet   Oral   Take 800 mg by mouth every 8 (eight) hours as needed.         Marland Kitchen QUEtiapine (SEROQUEL) 25 MG tablet   Oral   Take 25 mg by mouth 2 (two) times daily.         . butalbital-acetaminophen-caffeine (FIORICET) 50-325-40 MG per tablet   Oral   Take 1-2 tablets by mouth every 6 (six) hours as needed for headache. Patient not taking: Reported on 06/13/2015   20 tablet   0   . clindamycin (CLEOCIN) 300 MG capsule   Oral   Take 1 capsule (300 mg total) by mouth 4 (four) times daily. Patient not taking: Reported on 06/13/2015   40 capsule   0   . ibuprofen (ADVIL,MOTRIN)  800 MG tablet   Oral   Take 1 tablet (800 mg total) by mouth every 8 (eight) hours as needed for mild pain or moderate pain. Patient not taking: Reported on 06/13/2015   15 tablet   0   . oxyCODONE-acetaminophen (ROXICET) 5-325 MG per tablet   Oral   Take 1 tablet by mouth every 8 (eight) hours as needed for moderate pain or severe pain (Do not drive or operate heavy machinery while taking as can cause drowsiness.). Patient not taking: Reported on 06/13/2015   9 tablet   0   . traMADol (ULTRAM) 50 MG tablet   Oral   Take 1 tablet (50 mg total) by mouth every 6 (six) hours as needed. Patient not taking: Reported on 06/13/2015   12 tablet   0     Allergies Ambien; Ambien; Ritalin; and Ritalin  No family history on file.  Social History Social History  Substance Use Topics  . Smoking status: Current Every Day Smoker -- 0.50 packs/day for 20 years    Types: Cigarettes  . Smokeless tobacco: Never Used  . Alcohol Use: No    Review of Systems  Constitutional: Negative for fever. Eyes:  Negative for visual changes. ENT: Negative for sore throat. Cardiovascular: Negative for chest pain. Respiratory: Negative for shortness of breath. Gastrointestinal: Positive for vomiting and generalized abdominal pain. Genitourinary: Negative for dysuria. Musculoskeletal: Positive for right hand pain/numbness. Skin: Negative for rash. Neurological: Negative for headaches, focal weakness or numbness.   10-point ROS otherwise negative.  ____________________________________________   PHYSICAL EXAM:  VITAL SIGNS: ED Triage Vitals  Enc Vitals Group     BP 06/13/15 0038 130/66 mmHg     Pulse Rate 06/13/15 0038 81     Resp 06/13/15 0038 18     Temp 06/13/15 0038 97.5 F (36.4 C)     Temp Source 06/13/15 0038 Oral     SpO2 06/13/15 0038 100 %     Weight 06/13/15 0038 240 lb (108.863 kg)     Height 06/13/15 0038 5\' 5"  (1.651 m)     Head Cir --      Peak Flow --      Pain Score  06/13/15 0038 7     Pain Loc --      Pain Edu? --      Excl. in GC? --     Constitutional: Alert and oriented. Well appearing and in no distress. Eyes: Conjunctivae are normal. PERRL. Normal extraocular movements. ENT   Head: Normocephalic and atraumatic.   Nose: No congestion/rhinnorhea.   Mouth/Throat: Mucous membranes are moist.   Neck: No stridor. Hematological/Lymphatic/Immunilogical: No cervical lymphadenopathy. Cardiovascular: Normal rate, regular rhythm. Normal and symmetric distal pulses are present in all extremities. No murmurs, rubs, or gallops. Respiratory: Normal respiratory effort without tachypnea nor retractions. Breath sounds are clear and equal bilaterally. No wheezes/rales/rhonchi. Gastrointestinal: Right upper quadrant tenderness to palpation. No distention. There is no CVA tenderness. Genitourinary: deferred Musculoskeletal: Nontender with normal range of motion in all extremities. No joint effusions.  No lower extremity tenderness nor edema. Neurologic:  Normal speech and language. No gross focal neurologic deficits are appreciated. Speech is normal.  Skin:  Skin is warm, dry and intact. No rash noted. Psychiatric: Mood and affect are normal. Speech and behavior are normal. Patient exhibits appropriate insight and judgment.  ____________________________________________    LABS (pertinent positives/negatives)  Labs Reviewed  URINALYSIS COMPLETEWITH MICROSCOPIC ([facility] ONLY) - Abnormal; Notable for the following:    Color, Urine STRAW (*)    APPearance CLEAR (*)    Bacteria, UA RARE (*)    Squamous Epithelial / LPF 0-5 (*)    All other components within normal limits  COMPREHENSIVE METABOLIC PANEL - Abnormal; Notable for the following:    ALT 12 (*)    Total Bilirubin 0.2 (*)    All other components within normal limits  CBC  POC URINE PREG, ED  POCT PREGNANCY, URINE        INITIAL IMPRESSION / ASSESSMENT AND PLAN / ED  COURSE  Pertinent labs & imaging results that were available during my care of the patient were reviewed by me and considered in my medical decision making (see chart for details).  CT abdomen pending. Care transferred to Dr Langston MaskerShaevitz  ____________________________________________   FINAL CLINICAL IMPRESSION(S) / ED DIAGNOSES  Final diagnoses:  Abdominal pain, RUQ (right upper quadrant)      Darci Currentandolph N Sigismund Cross, MD 06/13/15 [phone]

## 2015-06-13 NOTE — ED Notes (Signed)
Patient transported to CT 

## 2015-06-20 ENCOUNTER — Encounter: Payer: Self-pay | Admitting: Emergency Medicine

## 2015-06-20 DIAGNOSIS — M7989 Other specified soft tissue disorders: Secondary | ICD-10-CM | POA: Insufficient documentation

## 2015-06-20 DIAGNOSIS — Z888 Allergy status to other drugs, medicaments and biological substances status: Secondary | ICD-10-CM | POA: Insufficient documentation

## 2015-06-20 DIAGNOSIS — J45909 Unspecified asthma, uncomplicated: Secondary | ICD-10-CM | POA: Insufficient documentation

## 2015-06-20 DIAGNOSIS — G40909 Epilepsy, unspecified, not intractable, without status epilepticus: Secondary | ICD-10-CM | POA: Diagnosis not present

## 2015-06-20 DIAGNOSIS — F17210 Nicotine dependence, cigarettes, uncomplicated: Secondary | ICD-10-CM | POA: Insufficient documentation

## 2015-06-20 DIAGNOSIS — Z79899 Other long term (current) drug therapy: Secondary | ICD-10-CM | POA: Diagnosis not present

## 2015-06-20 DIAGNOSIS — M79605 Pain in left leg: Secondary | ICD-10-CM | POA: Insufficient documentation

## 2015-06-20 DIAGNOSIS — R2242 Localized swelling, mass and lump, left lower limb: Secondary | ICD-10-CM | POA: Diagnosis present

## 2015-06-20 NOTE — ED Notes (Addendum)
Patient ambulatory to triage with steady gait, without difficulty or distress noted; pt reports left leg swelling & pain for few days with pain; st "from hip down"; st hx DVT

## 2015-06-21 ENCOUNTER — Emergency Department
Admission: EM | Admit: 2015-06-21 | Discharge: 2015-06-21 | Disposition: A | Discharge status: Home / Self Care | Payer: Medicaid Other | Attending: Emergency Medicine | Admitting: Emergency Medicine

## 2015-06-21 ENCOUNTER — Emergency Department: Payer: Medicaid Other

## 2015-06-21 DIAGNOSIS — M7989 Other specified soft tissue disorders: Secondary | ICD-10-CM

## 2015-06-21 DIAGNOSIS — M79605 Pain in left leg: Secondary | ICD-10-CM

## 2015-06-21 MED ORDER — OXYCODONE-ACETAMINOPHEN 5-325 MG PO TABS: 1.0000 | ORAL_TABLET | ORAL | Status: DC | PRN

## 2015-06-21 MED ORDER — OXYCODONE-ACETAMINOPHEN 5-325 MG PO TABS
1.0000 | ORAL_TABLET | Freq: Once | ORAL | Status: AC
  Administered 2015-06-21: 1 via ORAL
  Filled 2015-06-21: qty 1

## 2015-06-21 MED ORDER — IBUPROFEN 800 MG PO TABS: 800.0000 mg | ORAL_TABLET | Freq: Three times a day (TID) | ORAL | Status: DC | PRN

## 2015-06-21 MED ORDER — IBUPROFEN 800 MG PO TABS
800.0000 mg | ORAL_TABLET | Freq: Once | ORAL | Status: AC
  Administered 2015-06-21: 800 mg via ORAL
  Filled 2015-06-21: qty 1

## 2015-06-21 NOTE — ED Notes (Signed)
Pt reports left leg is swollen and warm to touch but denies any redness for the last three days - Pt denies injury - Pt reports she is unable to bear weight on the left leg for the last 24 hours

## 2015-06-21 NOTE — ED Provider Notes (Signed)
[facility] Emergency Department Provider Note  ____________________________________________  Time seen: Approximately 5:14 AM  I have reviewed the triage vital signs and the nursing notes.   HISTORY  Chief Complaint Leg Swelling    HPI Brianna Ashley is a 30 y.o. female who presents to the ED from home with a chief complaint of left leg swelling. Patient has a history of factor V Leiden who is supposed to be taking twice daily Lovenox shots but she states she has not done so for years. She also has a history of prior DVT secondary to her blood disorder. Complains of left lower extremity swelling and warmth were the past 3 days. Presents tonight secondary to painful ambulation and bearing weight on the affected leg. Denies recent travel or trauma.Reports she stands on her feet for 9 hours each evening at work at the [employer] and she only has Saturdays off. Denies associated fever, chills, chest pain, shortness of breath, abdominal pain, nausea, vomiting, diarrhea. Nothing makes her symptoms better or worse.   Past Medical History  Diagnosis Date  . Factor 5 Leiden mutation, heterozygous (HCC)   . Seizures (HCC)   . Carpal tunnel syndrome   . Migraine   . Asthma     There are no active problems to display for this patient.   Past Surgical History  Procedure Laterality Date  . Dilation and curettage of uterus    . Cesarean section      Current Outpatient Rx  Name  Route  Sig  Dispense  Refill  . butalbital-acetaminophen-caffeine (FIORICET) 50-325-40 MG per tablet   Oral   Take 1-2 tablets by mouth every 6 (six) hours as needed for headache. Patient not taking: Reported on 06/13/2015   20 tablet   0   . citalopram (CELEXA) 40 MG tablet   Oral   Take 40 mg by mouth daily.      5   . clindamycin (CLEOCIN) 300 MG capsule   Oral   Take 1 capsule (300 mg total) by mouth 4 (four) times daily. Patient not taking: Reported on 06/13/2015   40  capsule   0   . clonazePAM (KLONOPIN) 1 MG tablet   Oral   Take 1 tablet by mouth 3 (three) times daily.      2   . dicyclomine (BENTYL) 20 MG tablet   Oral   Take 1 tablet (20 mg total) by mouth 3 (three) times daily as needed.   15 tablet   0   . ibuprofen (ADVIL,MOTRIN) 800 MG tablet   Oral   Take 1 tablet (800 mg total) by mouth every 8 (eight) hours as needed for mild pain or moderate pain. Patient not taking: Reported on 06/13/2015   15 tablet   0   . ibuprofen (ADVIL,MOTRIN) 800 MG tablet   Oral   Take 800 mg by mouth every 8 (eight) hours as needed.         . ondansetron (ZOFRAN) 4 MG tablet   Oral   Take 1 tablet (4 mg total) by mouth daily as needed.   10 tablet   0   . oxyCODONE-acetaminophen (ROXICET) 5-325 MG per tablet   Oral   Take 1 tablet by mouth every 8 (eight) hours as needed for moderate pain or severe pain (Do not drive or operate heavy machinery while taking as can cause drowsiness.). Patient not taking: Reported on 06/13/2015   9 tablet   0   . QUEtiapine (  SEROQUEL) 25 MG tablet   Oral   Take 25 mg by mouth 2 (two) times daily.         . traMADol (ULTRAM) 50 MG tablet   Oral   Take 1 tablet (50 mg total) by mouth every 6 (six) hours as needed. Patient not taking: Reported on 06/13/2015   12 tablet   0     Allergies Ambien; Ambien; Ritalin; and Ritalin  No family history on file.  Social History Social History  Substance Use Topics  . Smoking status: Current Every Day Smoker -- 0.50 packs/day for 20 years    Types: Cigarettes  . Smokeless tobacco: Never Used  . Alcohol Use: No    Review of Systems  Constitutional: No fever/chills. Eyes: No visual changes. ENT: No sore throat. Cardiovascular: Denies chest pain. Respiratory: Denies shortness of breath. Gastrointestinal: No abdominal pain.  No nausea, no vomiting.  No diarrhea.  No constipation. Genitourinary: Negative for dysuria. Musculoskeletal: Positive for left lower  extremity pain and swelling. Negative for back pain. Skin: Negative for rash. Neurological: Negative for headaches, focal weakness or numbness.  10-point ROS otherwise negative.  ____________________________________________   PHYSICAL EXAM:  VITAL SIGNS: ED Triage Vitals  Enc Vitals Group     BP 06/20/15 2354 132/63 mmHg     Pulse Rate 06/20/15 2354 74     Resp 06/20/15 2354 20     Temp 06/20/15 2354 98.4 F (36.9 C)     Temp Source 06/20/15 2354 Oral     SpO2 06/20/15 2354 100 %     Weight 06/20/15 2354 240 lb (108.863 kg)     Height 06/20/15 2354 5\' 5"  (1.651 m)     Head Cir --      Peak Flow --      Pain Score 06/20/15 2353 9     Pain Loc --      Pain Edu? --      Excl. in GC? --     Constitutional: Alert and oriented. Well appearing and in no acute distress. Eyes: Conjunctivae are normal. PERRL. EOMI. Head: Atraumatic. Nose: No congestion/rhinnorhea. Mouth/Throat: Mucous membranes are moist.  Oropharynx non-erythematous. Neck: No stridor.   Cardiovascular: Normal rate, regular rhythm. Grossly normal heart sounds.  Good peripheral circulation. Respiratory: Normal respiratory effort.  No retractions. Lungs CTAB. Gastrointestinal: Obese. Soft and nontender. No distention. No abdominal bruits. No CVA tenderness. Musculoskeletal: Left lower extremity tender to palpation from hip to toes. Decreased range of motion secondary to pain. 2+ femoral, popliteal and distal pulses. Calf is swollen compared to right but supple without evidence for compartment syndrome. Left lower extremity is symmetrically warm without evidence for ischemia. No joint effusions. Neurologic:  Normal speech and language. No gross focal neurologic deficits are appreciated.  Skin:  Skin is warm, dry and intact. No rash noted. Psychiatric: Mood and affect are normal. Speech and behavior are normal.  ____________________________________________   LABS (all labs ordered are listed, but only abnormal  results are displayed)  Labs Reviewed - No data to display ____________________________________________  EKG  None ____________________________________________  RADIOLOGY  Doppler imaging left lower extremity interpreted per Dr. Cherly Hensenhang: No evidence of deep venous thrombosis. ____________________________________________   PROCEDURES  Procedure(s) performed: None  Critical Care performed: No  ____________________________________________   INITIAL IMPRESSION / ASSESSMENT AND PLAN / ED COURSE  Pertinent labs & imaging results that were available during my care of the patient were reviewed by me and considered in my medical decision making (see  chart for details).  30 year old female with Factor 5 Leiden mutation, prior history of DVT who presents with LLE pain & swelling. Doppler negative. No evidence for compartment syndrome or ischemic limb. Patient tells me her PCP refuses to prescribe her Lovenox and instead has suggested she take 2 daily aspirins instead. I am uncomfortable prescribing her Lovenox as she has been noncompliant in the past, and I do not see documentation of her factor V Leiden mutation. Will refer her to outpatient hematology for further evaluation. As for her left lower extremity edema and pain, there is no evidence of active infection, cellulitis or abscess. Will place patient on analgesia, crutches as needed for ambulation and she will closely follow-up with her PCP. Strict return precautions given. Patient verbalizes understanding and agrees with plan of care. ____________________________________________   FINAL CLINICAL IMPRESSION(S) / ED DIAGNOSES  Final diagnoses:  Leg swelling  Pain of left lower extremity      Irean Hong, MD 06/21/15 [phone]

## 2015-06-21 NOTE — Discharge Instructions (Signed)
1. You may take pain medicines as needed (Motrin/Percocet #15). 2. Elevate affected areas several times daily to reduce swelling. 3. Return to the ER for worsening symptoms, persistent vomiting, difficulty breathing or other concerns.  Edema Edema is an abnormal buildup of fluids in your bodytissues. Edema is somewhatdependent on gravity to pull the fluid to the lowest place in your body. That makes the condition more common in the legs and thighs (lower extremities). Painless swelling of the feet and ankles is common and becomes more likely as you get older. It is also common in looser tissues, like around your eyes.  When the affected area is squeezed, the fluid may move out of that spot and leave a dent for a few moments. This dent is called pitting.  CAUSES  There are many possible causes of edema. Eating too much salt and being on your feet or sitting for a long time can cause edema in your legs and ankles. Hot weather may make edema worse. Common medical causes of edema include:  Heart failure.  Liver disease.  Kidney disease.  Weak blood vessels in your legs.  Cancer.  An injury.  Pregnancy.  Some medications.  Obesity. SYMPTOMS  Edema is usually painless.Your skin may look swollen or shiny.  DIAGNOSIS  Your health care provider may be able to diagnose edema by asking about your medical history and doing a physical exam. You may need to have tests such as X-rays, an electrocardiogram, or blood tests to check for medical conditions that may cause edema.  TREATMENT  Edema treatment depends on the cause. If you have heart, liver, or kidney disease, you need the treatment appropriate for these conditions. General treatment may include:  Elevation of the affected body part above the level of your heart.  Compression of the affected body part. Pressure from elastic bandages or support stockings squeezes the tissues and forces fluid back into the blood vessels. This keeps  fluid from entering the tissues.  Restriction of fluid and salt intake.  Use of a water pill (diuretic). These medications are appropriate only for some types of edema. They pull fluid out of your body and make you urinate more often. This gets rid of fluid and reduces swelling, but diuretics can have side effects. Only use diuretics as directed by your health care provider. HOME CARE INSTRUCTIONS   Keep the affected body part above the level of your heart when you are lying down.   Do not sit still or stand for prolonged periods.   Do not put anything directly under your knees when lying down.  Do not wear constricting clothing or garters on your upper legs.   Exercise your legs to work the fluid back into your blood vessels. This may help the swelling go down.   Wear elastic bandages or support stockings to reduce ankle swelling as directed by your health care provider.   Eat a low-salt diet to reduce fluid if your health care provider recommends it.   Only take medicines as directed by your health care provider. SEEK MEDICAL CARE IF:   Your edema is not responding to treatment.  You have heart, liver, or kidney disease and notice symptoms of edema.  You have edema in your legs that does not improve after elevating them.   You have sudden and unexplained weight gain. SEEK IMMEDIATE MEDICAL CARE IF:   You develop shortness of breath or chest pain.   You cannot breathe when you lie down.  You  develop pain, redness, or warmth in the swollen areas.   You have heart, liver, or kidney disease and suddenly get edema.  You have a fever and your symptoms suddenly get worse. MAKE SURE YOU:   Understand these instructions.  Will watch your condition.  Will get help right away if you are not doing well or get worse.   This information is not intended to replace advice given to you by your health care provider. Make sure you discuss any questions you have with your  health care provider.   Document Released: 03/10/2005 Document Revised: 03/31/2014 Document Reviewed: 12/31/2012 Elsevier Interactive Patient Education 2016 [employer]  Musculoskeletal Pain Musculoskeletal pain is muscle and boney aches and pains. These pains can occur in any part of the body. Your caregiver may treat you without knowing the cause of the pain. They may treat you if blood or urine tests, X-rays, and other tests were normal.  CAUSES There is often not a definite cause or reason for these pains. These pains may be caused by a type of germ (virus). The discomfort may also come from overuse. Overuse includes working out too hard when your body is not fit. Boney aches also come from weather changes. Bone is sensitive to atmospheric pressure changes. HOME CARE INSTRUCTIONS   Ask when your test results will be ready. Make sure you get your test results.  Only take over-the-counter or prescription medicines for pain, discomfort, or fever as directed by your caregiver. If you were given medications for your condition, do not drive, operate machinery or power tools, or sign legal documents for 24 hours. Do not drink alcohol. Do not take sleeping pills or other medications that may interfere with treatment.  Continue all activities unless the activities cause more pain. When the pain lessens, slowly resume normal activities. Gradually increase the intensity and duration of the activities or exercise.  During periods of severe pain, bed rest may be helpful. Lay or sit in any position that is comfortable.  Putting ice on the injured area.  Put ice in a bag.  Place a towel between your skin and the bag.  Leave the ice on for 15 to 20 minutes, 3 to 4 times a day.  Follow up with your caregiver for continued problems and no reason can be found for the pain. If the pain becomes worse or does not go away, it may be necessary to repeat tests or do additional testing. Your caregiver may  need to look further for a possible cause. SEEK IMMEDIATE MEDICAL CARE IF:  You have pain that is getting worse and is not relieved by medications.  You develop chest pain that is associated with shortness or breath, sweating, feeling sick to your stomach (nauseous), or throw up (vomit).  Your pain becomes localized to the abdomen.  You develop any new symptoms that seem different or that concern you. MAKE SURE YOU:   Understand these instructions.  Will watch your condition.  Will get help right away if you are not doing well or get worse.   This information is not intended to replace advice given to you by your health care provider. Make sure you discuss any questions you have with your health care provider.   Document Released: 03/10/2005 Document Revised: 06/02/2011 Document Reviewed: 11/12/2012 Elsevier Interactive Patient Education [employer]

## 2015-07-16 ENCOUNTER — Emergency Department: Payer: Medicaid Other

## 2015-07-16 ENCOUNTER — Encounter: Payer: Self-pay | Admitting: Emergency Medicine

## 2015-07-16 ENCOUNTER — Emergency Department
Admission: EM | Admit: 2015-07-16 | Discharge: 2015-07-16 | Disposition: A | Discharge status: Home / Self Care | Payer: Medicaid Other | Attending: Emergency Medicine | Admitting: Emergency Medicine

## 2015-07-16 DIAGNOSIS — Z79899 Other long term (current) drug therapy: Secondary | ICD-10-CM | POA: Insufficient documentation

## 2015-07-16 DIAGNOSIS — F17210 Nicotine dependence, cigarettes, uncomplicated: Secondary | ICD-10-CM | POA: Diagnosis not present

## 2015-07-16 DIAGNOSIS — M549 Dorsalgia, unspecified: Secondary | ICD-10-CM | POA: Diagnosis present

## 2015-07-16 DIAGNOSIS — Y999 Unspecified external cause status: Secondary | ICD-10-CM | POA: Diagnosis not present

## 2015-07-16 DIAGNOSIS — W1830XA Fall on same level, unspecified, initial encounter: Secondary | ICD-10-CM | POA: Insufficient documentation

## 2015-07-16 DIAGNOSIS — F1290 Cannabis use, unspecified, uncomplicated: Secondary | ICD-10-CM | POA: Insufficient documentation

## 2015-07-16 DIAGNOSIS — Y939 Activity, unspecified: Secondary | ICD-10-CM | POA: Insufficient documentation

## 2015-07-16 DIAGNOSIS — Z792 Long term (current) use of antibiotics: Secondary | ICD-10-CM | POA: Insufficient documentation

## 2015-07-16 DIAGNOSIS — M545 Low back pain, unspecified: Secondary | ICD-10-CM

## 2015-07-16 DIAGNOSIS — L539 Erythematous condition, unspecified: Secondary | ICD-10-CM | POA: Diagnosis not present

## 2015-07-16 DIAGNOSIS — Y9289 Other specified places as the place of occurrence of the external cause: Secondary | ICD-10-CM | POA: Diagnosis not present

## 2015-07-16 DIAGNOSIS — J45909 Unspecified asthma, uncomplicated: Secondary | ICD-10-CM | POA: Diagnosis not present

## 2015-07-16 DIAGNOSIS — W19XXXA Unspecified fall, initial encounter: Secondary | ICD-10-CM

## 2015-07-16 DIAGNOSIS — L03113 Cellulitis of right upper limb: Secondary | ICD-10-CM | POA: Diagnosis not present

## 2015-07-16 LAB — POCT PREGNANCY, URINE: PREG TEST UR: NEGATIVE

## 2015-07-16 MED ORDER — BACITRACIN ZINC 500 UNIT/GM EX OINT
TOPICAL_OINTMENT | Freq: Once | CUTANEOUS | Status: AC
  Administered 2015-07-16: 1 via TOPICAL
  Filled 2015-07-16: qty 0.9

## 2015-07-16 MED ORDER — LIDOCAINE 5 % EX PTCH
1.0000 | MEDICATED_PATCH | CUTANEOUS | Status: DC
  Administered 2015-07-16: 1 via TRANSDERMAL
  Filled 2015-07-16 (×2): qty 1

## 2015-07-16 MED ORDER — LIDOCAINE 5 % EX PTCH: 1.0000 | MEDICATED_PATCH | CUTANEOUS | Status: DC

## 2015-07-16 MED ORDER — CYCLOBENZAPRINE HCL 10 MG PO TABS: 10.0000 mg | ORAL_TABLET | Freq: Three times a day (TID) | ORAL | Status: DC | PRN

## 2015-07-16 MED ORDER — DIAZEPAM 2 MG PO TABS
2.0000 mg | ORAL_TABLET | Freq: Once | ORAL | Status: AC
  Administered 2015-07-16: 2 mg via ORAL
  Filled 2015-07-16: qty 1

## 2015-07-16 MED ORDER — ACETAMINOPHEN 500 MG PO TABS
1000.0000 mg | ORAL_TABLET | Freq: Once | ORAL | Status: AC
  Administered 2015-07-16: 1000 mg via ORAL
  Filled 2015-07-16: qty 2

## 2015-07-16 MED ORDER — CLINDAMYCIN HCL 150 MG PO CAPS
300.0000 mg | ORAL_CAPSULE | Freq: Once | ORAL | Status: AC
  Administered 2015-07-16: 300 mg via ORAL
  Filled 2015-07-16: qty 2

## 2015-07-16 MED ORDER — CLINDAMYCIN HCL 300 MG PO CAPS: 300.0000 mg | ORAL_CAPSULE | Freq: Three times a day (TID) | ORAL | Status: DC

## 2015-07-16 NOTE — ED Notes (Signed)
Pt's boyfriend requesting "the doctor to order a scan to make sure she didn't break her back and don't have no fluid pockets in her back." message relayed to dr. Zenda AlpersWebster per boyfriend and pt request.

## 2015-07-16 NOTE — ED Notes (Addendum)
Pt present with c/o low back pain after slipping on wet ground and fulling on her buttocks; says she has a scar on her bottom that now hurts since the fall; pt would also like to be seen for redness on her left forearm; had a tattoo to the area about a week ago and now has redness to site; afebrile; pt adds she does not know when her LMP was and she has had unprotected intercourse since;

## 2015-07-16 NOTE — ED Notes (Signed)
Report to rebecca, rn.  

## 2015-07-16 NOTE — ED Provider Notes (Signed)
[facility] Emergency Department Provider Note  ____________________________________________  Time seen: Approximately 359 AM  I have reviewed the triage vital signs and the nursing notes.   HISTORY  Chief Complaint Fall; Back Pain; and Cellulitis    HPI Brianna Ashley is a 30 y.o. female who comes into the hospital today with back pain and arm pain. The patient reports that she fell in the parking lot at a store. Patient fell onto her bottom. She reports that she has pain in her lower back and in her bottom. She slipped because the ground was wet. She reports that her arm is hurting as well. The patient had a tattoo done 1 week ago and she thinks it is infected. She reports it hurts to move her left arm and the tattoo and red areas on her forearm. The patient reports that she took ibuprofen 800 mg at 11:30 which she always does but she reports that it has not helped and she has not taken anything since then. She reports that her pain is a 10 out of 10 in intensity and is working its way here and off the chart. She has been urinating well but did attempt to have a bowel movement and reports that it hurt when she had a bowel movement. She was straining and had some blood in her stool she reports just some drops. The patient is here for treatment of her symptoms.She's had no fevers no nausea no vomiting no dizziness or lightheadedness. She was able to walk without difficulty on her way here.   Past Medical History  Diagnosis Date  . Factor 5 Leiden mutation, heterozygous (HCC)   . Seizures (HCC)   . Carpal tunnel syndrome   . Migraine   . Asthma     There are no active problems to display for this patient.   Past Surgical History  Procedure Laterality Date  . Dilation and curettage of uterus    . Cesarean section    . Abscess drainage      Current Outpatient Rx  Name  Route  Sig  Dispense  Refill  . butalbital-acetaminophen-caffeine (FIORICET) 50-325-40  MG per tablet   Oral   Take 1-2 tablets by mouth every 6 (six) hours as needed for headache. Patient not taking: Reported on 06/13/2015   20 tablet   0   . citalopram (CELEXA) 40 MG tablet   Oral   Take 40 mg by mouth daily.      5   . clindamycin (CLEOCIN) 300 MG capsule   Oral   Take 1 capsule (300 mg total) by mouth 4 (four) times daily. Patient not taking: Reported on 06/13/2015   40 capsule   0   . clindamycin (CLEOCIN) 300 MG capsule   Oral   Take 1 capsule (300 mg total) by mouth 3 (three) times daily.   30 capsule   0   . clonazePAM (KLONOPIN) 1 MG tablet   Oral   Take 1 tablet by mouth 3 (three) times daily.      2   . cyclobenzaprine (FLEXERIL) 10 MG tablet   Oral   Take 1 tablet (10 mg total) by mouth every 8 (eight) hours as needed for muscle spasms.   15 tablet   0   . dicyclomine (BENTYL) 20 MG tablet   Oral   Take 1 tablet (20 mg total) by mouth 3 (three) times daily as needed.   15 tablet   0   .  ibuprofen (ADVIL,MOTRIN) 800 MG tablet   Oral   Take 1 tablet (800 mg total) by mouth every 8 (eight) hours as needed for moderate pain.   15 tablet   0   . lidocaine (LIDODERM) 5 %   Transdermal   Place 1 patch onto the skin daily. Remove & Discard patch within 12 hours or as directed by MD   10 patch   0   . ondansetron (ZOFRAN) 4 MG tablet   Oral   Take 1 tablet (4 mg total) by mouth daily as needed.   10 tablet   0   . oxyCODONE-acetaminophen (ROXICET) 5-325 MG tablet   Oral   Take 1 tablet by mouth every 4 (four) hours as needed for severe pain.   15 tablet   0   . QUEtiapine (SEROQUEL) 25 MG tablet   Oral   Take 25 mg by mouth 2 (two) times daily.         . traMADol (ULTRAM) 50 MG tablet   Oral   Take 1 tablet (50 mg total) by mouth every 6 (six) hours as needed. Patient not taking: Reported on 06/13/2015   12 tablet   0     Allergies Ambien; Ambien; Ritalin; and Ritalin  History reviewed. No pertinent family  history.  Social History Social History  Substance Use Topics  . Smoking status: Current Every Day Smoker -- 0.50 packs/day for 20 years    Types: Cigarettes  . Smokeless tobacco: Never Used  . Alcohol Use: No    Review of Systems Constitutional: No fever/chills Eyes: No visual changes. ENT: No sore throat. Cardiovascular: Denies chest pain. Respiratory: Denies shortness of breath. Gastrointestinal: No abdominal pain.  No nausea, no vomiting.  No diarrhea.  No constipation. Genitourinary: Negative for dysuria. Musculoskeletal:  back pain. Skin: Redness to left forearm. Neurological: Negative for headaches, focal weakness or numbness.  10-point ROS otherwise negative.  ____________________________________________   PHYSICAL EXAM:  VITAL SIGNS: ED Triage Vitals  Enc Vitals Group     BP 07/16/15 0341 130/72 mmHg     Pulse Rate 07/16/15 0341 74     Resp 07/16/15 0341 18     Temp 07/16/15 0341 98 F (36.7 C)     Temp Source 07/16/15 0341 Oral     SpO2 07/16/15 0341 100 %     Weight 07/16/15 0341 240 lb (108.863 kg)     Height --      Head Cir --      Peak Flow --      Pain Score 07/16/15 0342 10     Pain Loc --      Pain Edu? --      Excl. in GC? --     Constitutional: Alert and oriented. Well appearing and in mild distress. Eyes: Conjunctivae are normal. PERRL. EOMI. Head: Atraumatic. Nose: No congestion/rhinnorhea. Mouth/Throat: Mucous membranes are moist.  Oropharynx non-erythematous. Cardiovascular: Normal rate, regular rhythm. Grossly normal heart sounds.  Good peripheral circulation. Respiratory: Normal respiratory effort.  No retractions. Lungs CTAB. Gastrointestinal: Soft and nontender. No distention. Positive bowel sounds Musculoskeletal: No lower extremity tenderness nor edema.  TTP midline low back. Neurologic:  Normal speech and language. Skin:  Erythema and tenderness to right forearm Psychiatric: Mood and affect are normal.    ____________________________________________   LABS (all labs ordered are listed, but only abnormal results are displayed)  Labs Reviewed  POCT PREGNANCY, URINE   ____________________________________________  EKG  none ____________________________________________  RADIOLOGY  Lumbar  spine xray: Negative ____________________________________________   PROCEDURES  Procedure(s) performed: None  Critical Care performed: No  ____________________________________________   INITIAL IMPRESSION / ASSESSMENT AND PLAN / ED COURSE  Pertinent labs & imaging results that were available during my care of the patient were reviewed by me and considered in my medical decision making (see chart for details).  This is a 30 year old female who comes into the hospital today with some back pain. The patient reports that she fell in the parking lot and is having some pain in her back and her bottom. The patient also has some redness to her right forearm where she had a tattoo and she thinks is infected. I'll give the patient a dose of clindamycin, a Lidoderm patch to her back, some Tylenol and some Valium. She'll be reassessed once I receive an x-ray of her back to determine if she has any acute traumatic injury and as well as receiving the medication.  The patient reports that her pain was improved after the Lidoderm, Valium and Tylenol. Her x-ray was also unremarkable. She'll be discharged home to follow-up with her primary care physician. ____________________________________________   FINAL CLINICAL IMPRESSION(S) / ED DIAGNOSES  Final diagnoses:  Midline low back pain without sciatica  Fall, initial encounter  Cellulitis of right upper extremity      Rebecka Apley, MD 07/16/15 [phone]

## 2015-07-16 NOTE — Discharge Instructions (Signed)
Cellulitis Cellulitis is an infection of the skin and the tissue beneath it. The infected area is usually red and tender. Cellulitis occurs most often in the arms and lower legs.  CAUSES  Cellulitis is caused by bacteria that enter the skin through cracks or cuts in the skin. The most common types of bacteria that cause cellulitis are staphylococci and streptococci. SIGNS AND SYMPTOMS   Redness and warmth.  Swelling.  Tenderness or pain.  Fever. DIAGNOSIS  Your health care provider can usually determine what is wrong based on a physical exam. Blood tests may also be done. TREATMENT  Treatment usually involves taking an antibiotic medicine. HOME CARE INSTRUCTIONS   Take your antibiotic medicine as directed by your health care provider. Finish the antibiotic even if you start to feel better.  Keep the infected arm or leg elevated to reduce swelling.  Apply a warm cloth to the affected area up to 4 times per day to relieve pain.  Take medicines only as directed by your health care provider.  Keep all follow-up visits as directed by your health care provider. SEEK MEDICAL CARE IF:   You notice red streaks coming from the infected area.  Your red area gets larger or turns dark in color.  Your bone or joint underneath the infected area becomes painful after the skin has healed.  Your infection returns in the same area or another area.  You notice a swollen bump in the infected area.  You develop new symptoms.  You have a fever. SEEK IMMEDIATE MEDICAL CARE IF:   You feel very sleepy.  You develop vomiting or diarrhea.  You have a general ill feeling (malaise) with muscle aches and pains.   This information is not intended to replace advice given to you by your health care provider. Make sure you discuss any questions you have with your health care provider.   Document Released: 12/18/2004 Document Revised: 11/29/2014 Document Reviewed: 05/26/2011 Elsevier Interactive  Patient Education 2016 [employer]  Back Pain, Adult Back pain is very common. The pain often gets better over time. The cause of back pain is usually not dangerous. Most people can learn to manage their back pain on their own.  HOME CARE  Watch your back pain for any changes. The following actions may help to lessen any pain you are feeling:  Stay active. Start with short walks on flat ground if you can. Try to walk farther each day.  Exercise regularly as told by your doctor. Exercise helps your back heal faster. It also helps avoid future injury by keeping your muscles strong and flexible.  Do not sit, drive, or stand in one place for more than 30 minutes.  Do not stay in bed. Resting more than 1-2 days can slow down your recovery.  Be careful when you bend or lift an object. Use good form when lifting:  Bend at your knees.  Keep the object close to your body.  Do not twist.  Sleep on a firm mattress. Lie on your side, and bend your knees. If you lie on your back, put a pillow under your knees.  Take medicines only as told by your doctor.  Put ice on the injured area.  Put ice in a plastic bag.  Place a towel between your skin and the bag.  Leave the ice on for 20 minutes, 2-3 times a day for the first 2-3 days. After that, you can switch between ice and heat packs.  Avoid feeling  anxious or stressed. Find good ways to deal with stress, such as exercise.  Maintain a healthy weight. Extra weight puts stress on your back. GET HELP IF:   You have pain that does not go away with rest or medicine.  You have worsening pain that goes down into your legs or buttocks.  You have pain that does not get better in one week.  You have pain at night.  You lose weight.  You have a fever or chills. GET HELP RIGHT AWAY IF:   You cannot control when you poop (bowel movement) or pee (urinate).  Your arms or legs feel weak.  Your arms or legs lose feeling (numbness).  You  feel sick to your stomach (nauseous) or throw up (vomit).  You have belly (abdominal) pain.  You feel like you may pass out (faint).   This information is not intended to replace advice given to you by your health care provider. Make sure you discuss any questions you have with your health care provider.   Document Released: 08/27/2007 Document Revised: 03/31/2014 Document Reviewed: 07/12/2013 Elsevier Interactive Patient Education 2016 [place] Injury Prevention Back injuries can be very painful. They can also be difficult to heal. After having one back injury, you are more likely to injure your back again. It is important to learn how to avoid injuring or re-injuring your back. The following tips can help you to prevent a back injury. WHAT SHOULD I KNOW ABOUT PHYSICAL FITNESS?  Exercise for 30 minutes per day on most days of the week or as told by your doctor. Make sure to:  Do aerobic exercises, such as walking, jogging, biking, or swimming.  Do exercises that increase balance and strength, such as tai chi and yoga.  Do stretching exercises. This helps with flexibility.  Try to develop strong belly (abdominal) muscles. Your belly muscles help to support your back.  Stay at a healthy weight. This helps to decrease your risk of a back injury. WHAT SHOULD I KNOW ABOUT MY DIET?  Talk with your doctor about your overall diet. Take supplements and vitamins only as told by your doctor.  Talk with your doctor about how much calcium and vitamin D you need each day. These nutrients help to prevent weakening of the bones (osteoporosis).  Include good sources of calcium in your diet, such as:  Dairy products.  Green leafy vegetables.  Products that have had calcium added to them (fortified).  Include good sources of vitamin D in your diet, such as:  Milk.  Foods that have had vitamin D added to them. WHAT SHOULD I KNOW ABOUT MY POSTURE?  Sit up straight and stand up  straight. Avoid leaning forward when you sit or hunching over when you stand.  Choose chairs that have good low-back (lumbar) support.  If you work at a desk, sit close to it so you do not need to lean over. Keep your chin tucked in. Keep your neck drawn back. Keep your elbows bent so your arms look like the letter "L" (right angle).  Sit high and close to the steering wheel when you drive. Add a low-back support to your car seat, if needed.  Avoid sitting or standing in one position for very long. Take breaks to get up, stretch, and walk around at least one time every hour. Take breaks every hour if you are driving for long periods of time.  Sleep on your side with your knees slightly bent, or sleep  on your back with a pillow under your knees. Do not lie on the front of your body to sleep. WHAT SHOULD I KNOW ABOUT LIFTING, TWISTING, AND REACHING Lifting and Heavy Lifting  Avoid heavy lifting, especially lifting over and over again. If you must do heavy lifting:  Stretch before lifting.  Work slowly.  Rest between lifts.  Use a tool such as a cart or a dolly to move objects if one is available.  Make several small trips instead of carrying one heavy load.  Ask for help when you need it, especially when moving big objects.  Follow these steps when lifting:  Stand with your feet shoulder-width apart.  Get as close to the object as you can. Do not pick up a heavy object that is far from your body.  Use handles or lifting straps if they are available.  Bend at your knees. Squat down, but keep your heels off the floor.  Keep your shoulders back. Keep your chin tucked in. Keep your back straight.  Lift the object slowly while you tighten the muscles in your legs, belly, and butt. Keep the object as close to the center of your body as possible.  Follow these steps when putting down a heavy load:  Stand with your feet shoulder-width apart.  Lower the object slowly while you  tighten the muscles in your legs, belly, and butt. Keep the object as close to the center of your body as possible.  Keep your shoulders back. Keep your chin tucked in. Keep your back straight.  Bend at your knees. Squat down, but keep your heels off the floor.  Use handles or lifting straps if they are available. Twisting and Reaching  Avoid lifting heavy objects above your waist.  Do not twist at your waist while you are lifting or carrying a load. If you need to turn, move your feet.  Do not bend over without bending at your knees.  Avoid reaching over your head, across a table, or for an object on a high surface.  WHAT ARE SOME OTHER TIPS?  Avoid wet floors and icy ground. Keep sidewalks clear of ice to prevent falls.   Do not sleep on a mattress that is too soft or too hard.   Keep items that you use often within easy reach.   Put heavier objects on shelves at waist level, and put lighter objects on lower or higher shelves.  Find ways to lower your stress, such as:  Exercise.  Massage.  Relaxation techniques.  Talk with your doctor if you feel anxious or depressed. These conditions can make back pain worse.  Wear flat heel shoes with cushioned soles.  Avoid making quick (sudden) movements.  Use both shoulder straps when carrying a backpack.  Do not use any tobacco products, including cigarettes, chewing tobacco, or electronic cigarettes. If you need help quitting, ask your doctor.   This information is not intended to replace advice given to you by your health care provider. Make sure you discuss any questions you have with your health care provider.   Document Released: 08/27/2007 Document Revised: 07/25/2014 Document Reviewed: 03/14/2014 Elsevier Interactive Patient Education [employer].

## 2015-07-20 ENCOUNTER — Emergency Department: Payer: Medicaid Other

## 2015-07-20 ENCOUNTER — Encounter: Payer: Self-pay | Admitting: Emergency Medicine

## 2015-07-20 ENCOUNTER — Emergency Department
Admission: EM | Admit: 2015-07-20 | Discharge: 2015-07-20 | Disposition: A | Discharge status: Home / Self Care | Payer: Medicaid Other | Attending: Emergency Medicine | Admitting: Emergency Medicine

## 2015-07-20 ENCOUNTER — Other Ambulatory Visit: Payer: Self-pay

## 2015-07-20 DIAGNOSIS — F1210 Cannabis abuse, uncomplicated: Secondary | ICD-10-CM | POA: Diagnosis not present

## 2015-07-20 DIAGNOSIS — R0789 Other chest pain: Secondary | ICD-10-CM | POA: Insufficient documentation

## 2015-07-20 DIAGNOSIS — J45909 Unspecified asthma, uncomplicated: Secondary | ICD-10-CM | POA: Diagnosis not present

## 2015-07-20 DIAGNOSIS — Z79899 Other long term (current) drug therapy: Secondary | ICD-10-CM | POA: Insufficient documentation

## 2015-07-20 DIAGNOSIS — F17210 Nicotine dependence, cigarettes, uncomplicated: Secondary | ICD-10-CM | POA: Insufficient documentation

## 2015-07-20 DIAGNOSIS — Z792 Long term (current) use of antibiotics: Secondary | ICD-10-CM | POA: Insufficient documentation

## 2015-07-20 DIAGNOSIS — R079 Chest pain, unspecified: Secondary | ICD-10-CM | POA: Diagnosis present

## 2015-07-20 LAB — BASIC METABOLIC PANEL
Anion gap: 7 (ref 5–15)
BUN: 16 mg/dL (ref 6–20)
CHLORIDE: 104 mmol/L (ref 101–111)
CO2: 27 mmol/L (ref 22–32)
Calcium: 9.9 mg/dL (ref 8.9–10.3)
Creatinine, Ser: 0.9 mg/dL (ref 0.44–1.00)
GFR calc Af Amer: >60 mL/min (ref >60)
GFR calc non Af Amer: >60 mL/min (ref >60)
GLUCOSE: 101 mg/dL — AB (ref 65–99)
POTASSIUM: 3.8 mmol/L (ref 3.5–5.1)
Sodium: 138 mmol/L (ref 135–145)

## 2015-07-20 LAB — URINE DRUG SCREEN, QUALITATIVE (ARMC ONLY)
Amphetamines, Ur Screen: NOT DETECTED
BENZODIAZEPINE, UR SCRN: POSITIVE — AB
Barbiturates, Ur Screen: NOT DETECTED
CANNABINOID 50 NG, UR ~~LOC~~: POSITIVE — AB
COCAINE METABOLITE, UR ~~LOC~~: NOT DETECTED
MDMA (Ecstasy)Ur Screen: NOT DETECTED
Methadone Scn, Ur: NOT DETECTED
OPIATE, UR SCREEN: NOT DETECTED
Phencyclidine (PCP) Ur S: NOT DETECTED
Tricyclic, Ur Screen: POSITIVE — AB

## 2015-07-20 LAB — TROPONIN I: Troponin I: <0.03 ng/mL (ref <0.031)

## 2015-07-20 LAB — CBC
HEMATOCRIT: 39.6 % (ref 35.0–47.0)
Hemoglobin: 13.5 g/dL (ref 12.0–16.0)
MCH: 30.3 pg (ref 26.0–34.0)
MCHC: 34.2 g/dL (ref 32.0–36.0)
MCV: 88.5 fL (ref 80.0–100.0)
Platelets: 269 10*3/uL (ref 150–440)
RBC: 4.47 MIL/uL (ref 3.80–5.20)
RDW: 13.7 % (ref 11.5–14.5)
WBC: 11.4 10*3/uL — ABNORMAL HIGH (ref 3.6–11.0)

## 2015-07-20 MED ORDER — BACITRACIN ZINC 500 UNIT/GM EX OINT
TOPICAL_OINTMENT | Freq: Once | CUTANEOUS | Status: AC
  Administered 2015-07-20: 1 via TOPICAL

## 2015-07-20 MED ORDER — ACETAMINOPHEN 500 MG PO TABS
1000.0000 mg | ORAL_TABLET | Freq: Once | ORAL | Status: AC
  Administered 2015-07-20: 1000 mg via ORAL
  Filled 2015-07-20: qty 2

## 2015-07-20 MED ORDER — IOPAMIDOL (ISOVUE-370) INJECTION 76%
100.0000 mL | Freq: Once | INTRAVENOUS | Status: AC | PRN
  Administered 2015-07-20: 100 mL via INTRAVENOUS

## 2015-07-20 MED ORDER — IBUPROFEN 800 MG PO TABS: 800.0000 mg | ORAL_TABLET | Freq: Three times a day (TID) | ORAL | Status: DC | PRN

## 2015-07-20 MED ORDER — BACITRACIN ZINC 500 UNIT/GM EX OINT
TOPICAL_OINTMENT | CUTANEOUS | Status: AC
  Administered 2015-07-20: 1 via TOPICAL
  Filled 2015-07-20: qty 0.9

## 2015-07-20 MED ORDER — KETOROLAC TROMETHAMINE 30 MG/ML IJ SOLN
10.0000 mg | Freq: Once | INTRAMUSCULAR | Status: AC
  Administered 2015-07-20: 9.9 mg via INTRAVENOUS
  Filled 2015-07-20: qty 1

## 2015-07-20 NOTE — ED Provider Notes (Signed)
[facility] Emergency Department Provider Note  ____________________________________________  Time seen: Approximately 4:27 AM  I have reviewed the triage vital signs and the nursing notes.   HISTORY  Chief Complaint Chest Pain    HPI Brianna Ashley is a 30 y.o. female who presents to the ED from work with a chief complain of chest pain. 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 Onset of pain approximately 9:30 PM. Patient reports midsternal chest pain which she describes as a mixture of tightness and stabbing. Reports pain increases with movement, lifting and deep breathing. Denies associated symptoms of diaphoresis, shortness of breath, nausea, vomiting or palpitations. Denies recent fever, chills, cough, congestion, abdominal pain, diarrhea. Denies recent travel or trauma.   Past Medical History  Diagnosis Date  . Factor 5 Leiden mutation, heterozygous (HCC)   . Seizures (HCC)   . Carpal tunnel syndrome   . Migraine   . Asthma     There are no active problems to display for this patient.   Past Surgical History  Procedure Laterality Date  . Dilation and curettage of uterus    . Cesarean section    . Abscess drainage      Current Outpatient Rx  Name  Route  Sig  Dispense  Refill  . clonazePAM (KLONOPIN) 1 MG tablet   Oral   Take 1 tablet by mouth 3 (three) times daily.      2   . doxycycline (VIBRAMYCIN) 100 MG capsule   Oral   Take 100 mg by mouth 2 (two) times daily.         . promethazine (PHENERGAN) 25 MG tablet   Oral   Take 25 mg by mouth every 6 (six) hours as needed for nausea or vomiting.         . butalbital-acetaminophen-caffeine (FIORICET) 50-325-40 MG per tablet   Oral   Take 1-2 tablets by mouth every 6 (six) hours as needed for headache. Patient  not taking: Reported on 06/13/2015   20 tablet   0   . citalopram (CELEXA) 40 MG tablet   Oral   Take 40 mg by mouth daily.      5   . clindamycin (CLEOCIN) 300 MG capsule   Oral   Take 1 capsule (300 mg total) by mouth 4 (four) times daily. Patient not taking: Reported on 06/13/2015   40 capsule   0   . clindamycin (CLEOCIN) 300 MG capsule   Oral   Take 1 capsule (300 mg total) by mouth 3 (three) times daily.   30 capsule   0   . cyclobenzaprine (FLEXERIL) 10 MG tablet   Oral   Take 1 tablet (10 mg total) by mouth every 8 (eight) hours as needed for muscle spasms.   15 tablet   0   . dicyclomine (BENTYL) 20 MG tablet   Oral   Take 1 tablet (20 mg total) by mouth 3 (three) times daily as needed.   15 tablet   0   . ibuprofen (ADVIL,MOTRIN) 800 MG tablet   Oral   Take 1 tablet (800 mg total) by mouth every 8 (eight) hours as needed for moderate pain.   15 tablet   0   . lidocaine (LIDODERM) 5 %   Transdermal   Place 1 patch onto the skin  daily. Remove & Discard patch within 12 hours or as directed by MD   10 patch   0   . ondansetron (ZOFRAN) 4 MG tablet   Oral   Take 1 tablet (4 mg total) by mouth daily as needed.   10 tablet   0   . oxyCODONE-acetaminophen (ROXICET) 5-325 MG tablet   Oral   Take 1 tablet by mouth every 4 (four) hours as needed for severe pain.   15 tablet   0   . QUEtiapine (SEROQUEL) 25 MG tablet   Oral   Take 25 mg by mouth 2 (two) times daily.         . traMADol (ULTRAM) 50 MG tablet   Oral   Take 1 tablet (50 mg total) by mouth every 6 (six) hours as needed. Patient not taking: Reported on 06/13/2015   12 tablet   0     Allergies Ambien; Ambien; Ritalin; and Ritalin  History reviewed. No pertinent family history.  Social History Social History  Substance Use Topics  . Smoking status: Current Every Day Smoker -- 0.50 packs/day for 20 years    Types: Cigarettes  . Smokeless tobacco: Never Used  . Alcohol Use: No     Review of Systems  Constitutional: No fever/chills.  Eyes: No visual changes. ENT: No sore throat. Cardiovascular: Positive for chest pain. Respiratory: Denies shortness of breath. Gastrointestinal: No abdominal pain.  No nausea, no vomiting.  No diarrhea.  No constipation. Genitourinary: Negative for dysuria. Musculoskeletal: Negative for back pain. Skin: Negative for rash. Neurological: Negative for headaches, focal weakness or numbness.  10-point ROS otherwise negative.  ____________________________________________   PHYSICAL EXAM:  VITAL SIGNS: ED Triage Vitals  Enc Vitals Group     BP 07/20/15 0246 113/64 mmHg     Pulse Rate 07/20/15 0246 98     Resp 07/20/15 0246 18     Temp 07/20/15 0246 98.1 F (36.7 C)     Temp Source 07/20/15 0246 Oral     SpO2 07/20/15 0246 100 %     Weight 07/20/15 0246 243 lb (110.224 kg)     Height 07/20/15 0246  (1.651 m)     Head Cir --      Peak Flow --      Pain Score 07/20/15 0243 10     Pain Loc --      Pain Edu? --      Excl. in GC? --     Constitutional: Somnolent but arousable. Well appearing and in no acute distress. Eyes: Conjunctivae are normal. PERRL. EOMI. Head: Atraumatic. Nose: No congestion/rhinnorhea. Mouth/Throat: Mucous membranes are moist.  Oropharynx non-erythematous. Neck: No stridor.   Cardiovascular: Normal rate, regular rhythm. Grossly normal heart sounds.  Good peripheral circulation. Respiratory: Normal respiratory effort.  No retractions. Lungs CTAB. Anterior chest wall tender to palpation. Gastrointestinal: Soft and nontender. No distention. No abdominal bruits. No CVA tenderness. Musculoskeletal: No lower extremity tenderness nor edema.  No joint effusions. Neurologic:  Normal speech and language. No gross focal neurologic deficits are appreciated. No gait instability. Skin:  Skin is warm, dry and intact. No rash noted. Psychiatric: Mood and affect are normal. Speech and behavior are  normal.  ____________________________________________   LABS (all labs ordered are listed, but only abnormal results are displayed)  Labs Reviewed  BASIC METABOLIC PANEL - Abnormal; Notable for the following:    Glucose, Bld 101 (*)    All other components within normal limits  CBC - Abnormal;  Notable for the following:    WBC 11.4 (*)    All other components within normal limits  URINE DRUG SCREEN, QUALITATIVE ([facility] ONLY) - Abnormal; Notable for the following:    Tricyclic, Ur Screen POSITIVE (*)    Cannabinoid 50 Ng, Ur Solomon POSITIVE (*)    Benzodiazepine, Ur Scrn POSITIVE (*)    All other components within normal limits  TROPONIN I   ____________________________________________  EKG  ED ECG REPORT I, SUNG,JADE J, the attending physician, personally viewed and interpreted this ECG.   Date: 07/20/2015  EKG Time: 0244  Rate: 92  Rhythm: normal EKG, normal sinus rhythm  Axis: Normal  Intervals:none  ST&T Change: Nonspecific  ____________________________________________  RADIOLOGY  Chest view (view by me, interpreted per Dr. Grace Isaac): Negative chest.  CTA chest interpreted per Dr. Grace Isaac: 1. No evidence of pulmonary embolism. 2. Patchy air trapping presumably related to patient's history of asthma. ____________________________________________   PROCEDURES  Procedure(s) performed: None  Critical Care performed: No  ____________________________________________   INITIAL IMPRESSION / ASSESSMENT AND PLAN / ED COURSE  Pertinent labs & imaging results that were available during my care of the patient were reviewed by me and considered in my medical decision making (see chart for details).  30 year old female with a history of factor V Leiden mutation who is not currently on anticoagulation presenting with reproducible chest pain. Patient is somnolent but arousable on exam, eyes are bloodshot and appears under the influence of substances. Records note she presents to  the ED often with a chief complaint of pain. Review of the narcotics database reveal patient receives monthly benzodiazepine and in March she received Percocet. Given my concern for opioid abuse, will treat pain with non-opioid analgesics. Given patient's factor V Leiden mutation and her noncompliance with anticoagulation, will obtain CT angiogram chest to evaluate for pulmonary embolism.  ----------------------------------------- 6:00 AM on 07/20/2015 -----------------------------------------  Patient resting in no acute distress. Updated patient of CT imaging results. I explained to patient she does not require opioid analgesics for her reproducible chest wall pain. Strict return precautions given. Patient verbalizes understanding and agrees with plan of care. ____________________________________________   FINAL CLINICAL IMPRESSION(S) / ED DIAGNOSES  Final diagnoses:  Chest pain, unspecified chest pain type  Chest wall pain  Marijuana abuse      Irean Hong, MD 07/20/15 [phone]

## 2015-07-20 NOTE — Discharge Instructions (Signed)
1. You may take Tylenol and Motrin (#15) as needed for chest wall pain. 2. Apply moist heat to affected area several times daily. 3. Return to the ER for worsening symptoms, persistent vomiting, difficulty breathing or other concerns.  Nonspecific Chest Pain  Chest pain can be caused by many different conditions. There is always a chance that your pain could be related to something serious, such as a heart attack or a blood clot in your lungs. Chest pain can also be caused by conditions that are not life-threatening. If you have chest pain, it is very important to follow up with your health care provider. CAUSES  Chest pain can be caused by:  Heartburn.  Pneumonia or bronchitis.  Anxiety or stress.  Inflammation around your heart (pericarditis) or lung (pleuritis or pleurisy).  A blood clot in your lung.  A collapsed lung (pneumothorax). It can develop suddenly on its own (spontaneous pneumothorax) or from trauma to the chest.  Shingles infection (varicella-zoster virus).  Heart attack.  Damage to the bones, muscles, and cartilage that make up your chest wall. This can include:  Bruised bones due to injury.  Strained muscles or cartilage due to frequent or repeated coughing or overwork.  Fracture to one or more ribs.  Sore cartilage due to inflammation (costochondritis). RISK FACTORS  Risk factors for chest pain may include:  Activities that increase your risk for trauma or injury to your chest.  Respiratory infections or conditions that cause frequent coughing.  Medical conditions or overeating that can cause heartburn.  Heart disease or family history of heart disease.  Conditions or health behaviors that increase your risk of developing a blood clot.  Having had chicken pox (varicella zoster). SIGNS AND SYMPTOMS Chest pain can feel like:  Burning or tingling on the surface of your chest or deep in your chest.  Crushing, pressure, aching, or squeezing  pain.  Dull or sharp pain that is worse when you move, cough, or take a deep breath.  Pain that is also felt in your back, neck, shoulder, or arm, or pain that spreads to any of these areas. Your chest pain may come and go, or it may stay constant. DIAGNOSIS Lab tests or other studies may be needed to find the cause of your pain. Your health care provider may have you take a test called an ambulatory ECG (electrocardiogram). An ECG records your heartbeat patterns at the time the test is performed. You may also have other tests, such as:  Transthoracic echocardiogram (TTE). During echocardiography, sound waves are used to create a picture of all of the heart structures and to look at how blood flows through your heart.  Transesophageal echocardiogram (TEE).This is a more advanced imaging test that obtains images from inside your body. It allows your health care provider to see your heart in finer detail.  Cardiac monitoring. This allows your health care provider to monitor your heart rate and rhythm in real time.  Holter monitor. This is a portable device that records your heartbeat and can help to diagnose abnormal heartbeats. It allows your health care provider to track your heart activity for several days, if needed.  Stress tests. These can be done through exercise or by taking medicine that makes your heart beat more quickly.  Blood tests.  Imaging tests. TREATMENT  Your treatment depends on what is causing your chest pain. Treatment may include:  Medicines. These may include:  Acid blockers for heartburn.  Anti-inflammatory medicine.  Pain medicine for inflammatory  conditions.  Antibiotic medicine, if an infection is present.  Medicines to dissolve blood clots.  Medicines to treat coronary artery disease.  Supportive care for conditions that do not require medicines. This may include:  Resting.  Applying heat or cold packs to injured areas.  Limiting activities  until pain decreases. HOME CARE INSTRUCTIONS  If you were prescribed an antibiotic medicine, finish it all even if you start to feel better.  Avoid any activities that bring on chest pain.  Do not use any tobacco products, including cigarettes, chewing tobacco, or electronic cigarettes. If you need help quitting, ask your health care provider.  Do not drink alcohol.  Take medicines only as directed by your health care provider.  Keep all follow-up visits as directed by your health care provider. This is important. This includes any further testing if your chest pain does not go away.  If heartburn is the cause for your chest pain, you may be told to keep your head raised (elevated) while sleeping. This reduces the chance that acid will go from your stomach into your esophagus.  Make lifestyle changes as directed by your health care provider. These may include:  Getting regular exercise. Ask your health care provider to suggest some activities that are safe for you.  Eating a heart-healthy diet. A registered dietitian can help you to learn healthy eating options.  Maintaining a healthy weight.  Managing diabetes, if necessary.  Reducing stress. SEEK MEDICAL CARE IF:  Your chest pain does not go away after treatment.  You have a rash with blisters on your chest.  You have a fever. SEEK IMMEDIATE MEDICAL CARE IF:   Your chest pain is worse.  You have an increasing cough, or you cough up blood.  You have severe abdominal pain.  You have severe weakness.  You faint.  You have chills.  You have sudden, unexplained chest discomfort.  You have sudden, unexplained discomfort in your arms, back, neck, or jaw.  You have shortness of breath at any time.  You suddenly start to sweat, or your skin gets clammy.  You feel nauseous or you vomit.  You suddenly feel light-headed or dizzy.  Your heart begins to beat quickly, or it feels like it is skipping beats. These  symptoms may represent a serious problem that is an emergency. Do not wait to see if the symptoms will go away. Get medical help right away. Call your local emergency services (911 in the [place] Do not drive yourself to the hospital.   This information is not intended to replace advice given to you by your health care provider. Make sure you discuss any questions you have with your health care provider.   Document Released: 12/18/2004 Document Revised: 03/31/2014 Document Reviewed: 10/14/2013 Elsevier Interactive Patient Education 2016 [employer]  Chest Wall Pain Chest wall pain is pain in or around the bones and muscles of your chest. Sometimes, an injury causes this pain. Sometimes, the cause may not be known. This pain may take several weeks or longer to get better. HOME CARE INSTRUCTIONS  Pay attention to any changes in your symptoms. Take these actions to help with your pain:   Rest as told by your health care provider.   Avoid activities that cause pain. These include any activities that use your chest muscles or your abdominal and side muscles to lift heavy items.   If directed, apply ice to the painful area:  Put ice in a plastic bag.  Place a towel  between your skin and the bag.  Leave the ice on for 20 minutes, 2-3 times per day.  Take over-the-counter and prescription medicines only as told by your health care provider.  Do not use tobacco products, including cigarettes, chewing tobacco, and e-cigarettes. If you need help quitting, ask your health care provider.  Keep all follow-up visits as told by your health care provider. This is important. SEEK MEDICAL CARE IF:  You have a fever.  Your chest pain becomes worse.  You have new symptoms. SEEK IMMEDIATE MEDICAL CARE IF:  You have nausea or vomiting.  You feel sweaty or light-headed.  You have a cough with phlegm (sputum) or you cough up blood.  You develop shortness of breath.   This information is not  intended to replace advice given to you by your health care provider. Make sure you discuss any questions you have with your health care provider.   Document Released: 03/10/2005 Document Revised: 11/29/2014 Document Reviewed: 06/05/2014 Elsevier Interactive Patient Education [employer]

## 2015-07-20 NOTE — ED Notes (Signed)
Patient ambulatory to triage with steady gait, without difficulty or distress noted; pt reports mid CP since 10pm, nonradiating with no accomp symptoms; st pain increases with movement and lifting

## 2015-08-21 ENCOUNTER — Emergency Department
Admission: EM | Admit: 2015-08-21 | Discharge: 2015-08-21 | Disposition: A | Discharge status: Home / Self Care | Payer: Medicaid Other | Attending: Emergency Medicine | Admitting: Emergency Medicine

## 2015-08-21 DIAGNOSIS — F17210 Nicotine dependence, cigarettes, uncomplicated: Secondary | ICD-10-CM | POA: Diagnosis not present

## 2015-08-21 DIAGNOSIS — Z5321 Procedure and treatment not carried out due to patient leaving prior to being seen by health care provider: Secondary | ICD-10-CM | POA: Diagnosis not present

## 2015-08-21 DIAGNOSIS — H9203 Otalgia, bilateral: Secondary | ICD-10-CM | POA: Diagnosis present

## 2015-08-21 DIAGNOSIS — F1290 Cannabis use, unspecified, uncomplicated: Secondary | ICD-10-CM | POA: Diagnosis not present

## 2015-08-21 DIAGNOSIS — K0889 Other specified disorders of teeth and supporting structures: Secondary | ICD-10-CM | POA: Insufficient documentation

## 2015-08-21 NOTE — ED Notes (Signed)
Patient ambulatory to triage with steady gait, without difficulty or distress noted; pt reports left sided dental pain and bilat earache for several days; denies any recent congestion

## 2015-09-06 ENCOUNTER — Emergency Department
Admission: EM | Admit: 2015-09-06 | Discharge: 2015-09-06 | Disposition: A | Discharge status: Home / Self Care | Payer: Medicaid Other | Attending: Emergency Medicine | Admitting: Emergency Medicine

## 2015-09-06 ENCOUNTER — Encounter: Payer: Self-pay | Admitting: *Deleted

## 2015-09-06 DIAGNOSIS — J069 Acute upper respiratory infection, unspecified: Secondary | ICD-10-CM | POA: Diagnosis not present

## 2015-09-06 DIAGNOSIS — F17210 Nicotine dependence, cigarettes, uncomplicated: Secondary | ICD-10-CM | POA: Insufficient documentation

## 2015-09-06 DIAGNOSIS — Z791 Long term (current) use of non-steroidal anti-inflammatories (NSAID): Secondary | ICD-10-CM | POA: Insufficient documentation

## 2015-09-06 DIAGNOSIS — K047 Periapical abscess without sinus: Secondary | ICD-10-CM

## 2015-09-06 DIAGNOSIS — J45909 Unspecified asthma, uncomplicated: Secondary | ICD-10-CM | POA: Insufficient documentation

## 2015-09-06 DIAGNOSIS — K029 Dental caries, unspecified: Secondary | ICD-10-CM | POA: Insufficient documentation

## 2015-09-06 DIAGNOSIS — F1290 Cannabis use, unspecified, uncomplicated: Secondary | ICD-10-CM | POA: Insufficient documentation

## 2015-09-06 DIAGNOSIS — J029 Acute pharyngitis, unspecified: Secondary | ICD-10-CM | POA: Diagnosis present

## 2015-09-06 LAB — POCT RAPID STREP A: Streptococcus, Group A Screen (Direct): NEGATIVE

## 2015-09-06 MED ORDER — PENICILLIN V POTASSIUM 500 MG PO TABS: 500.0000 mg | ORAL_TABLET | Freq: Four times a day (QID) | ORAL | Status: DC

## 2015-09-06 MED ORDER — PENICILLIN V POTASSIUM 250 MG PO TABS
500.0000 mg | ORAL_TABLET | Freq: Once | ORAL | Status: AC
  Administered 2015-09-06: 500 mg via ORAL
  Filled 2015-09-06: qty 2

## 2015-09-06 NOTE — ED Provider Notes (Addendum)
[facility] Emergency Department Provider Note   ____________________________________________  Time seen: Approximately 325 AM  I have reviewed the triage vital signs and the nursing notes.   HISTORY  Chief Complaint Sore Throat   HPI Brianna Ashley is a 30 y.o. female for the history of seizures as well as factor V Leiden mutation was presenting with 2 days of runny nose, cough as well as sore throat. She says that when she coughs the pain in her throat is radiating to her left ear. Denies fever. Denies any nausea vomiting or diarrhea. No known sick contacts. Also complaining of left-sided dental pain across the lower molars.   Past Medical History  Diagnosis Date  . Factor 5 Leiden mutation, heterozygous (HCC)   . Seizures (HCC)   . Carpal tunnel syndrome   . Migraine   . Asthma     There are no active problems to display for this patient.   Past Surgical History  Procedure Laterality Date  . Dilation and curettage of uterus    . Cesarean section    . Abscess drainage      Current Outpatient Rx  Name  Route  Sig  Dispense  Refill  . butalbital-acetaminophen-caffeine (FIORICET) 50-325-40 MG per tablet   Oral   Take 1-2 tablets by mouth every 6 (six) hours as needed for headache. Patient not taking: Reported on 06/13/2015   20 tablet   0   . citalopram (CELEXA) 40 MG tablet   Oral   Take 40 mg by mouth daily.      5   . clindamycin (CLEOCIN) 300 MG capsule   Oral   Take 1 capsule (300 mg total) by mouth 4 (four) times daily. Patient not taking: Reported on 06/13/2015   40 capsule   0   . clindamycin (CLEOCIN) 300 MG capsule   Oral   Take 1 capsule (300 mg total) by mouth 3 (three) times daily.   30 capsule   0   . clonazePAM (KLONOPIN) 1 MG tablet   Oral   Take 1 tablet by mouth 3 (three) times daily.      2   . cyclobenzaprine (FLEXERIL) 10 MG tablet   Oral   Take 1 tablet (10 mg total) by mouth every 8 (eight)  hours as needed for muscle spasms.   15 tablet   0   . dicyclomine (BENTYL) 20 MG tablet   Oral   Take 1 tablet (20 mg total) by mouth 3 (three) times daily as needed.   15 tablet   0   . doxycycline (VIBRAMYCIN) 100 MG capsule   Oral   Take 100 mg by mouth 2 (two) times daily.         Marland Kitchen. ibuprofen (ADVIL,MOTRIN) 800 MG tablet   Oral   Take 1 tablet (800 mg total) by mouth every 8 (eight) hours as needed for moderate pain.   15 tablet   0   . lidocaine (LIDODERM) 5 %   Transdermal   Place 1 patch onto the skin daily. Remove & Discard patch within 12 hours or as directed by MD   10 patch   0   . ondansetron (ZOFRAN) 4 MG tablet   Oral   Take 1 tablet (4 mg total) by mouth daily as needed.   10 tablet   0   . oxyCODONE-acetaminophen (ROXICET) 5-325 MG tablet   Oral   Take 1 tablet by mouth every 4 (four) hours as  needed for severe pain.   15 tablet   0   . promethazine (PHENERGAN) 25 MG tablet   Oral   Take 25 mg by mouth every 6 (six) hours as needed for nausea or vomiting.         Marland Kitchen QUEtiapine (SEROQUEL) 25 MG tablet   Oral   Take 25 mg by mouth 2 (two) times daily.         . traMADol (ULTRAM) 50 MG tablet   Oral   Take 1 tablet (50 mg total) by mouth every 6 (six) hours as needed. Patient not taking: Reported on 06/13/2015   12 tablet   0     Allergies Ambien; Ambien; Ritalin; and Ritalin  No family history on file.  Social History Social History  Substance Use Topics  . Smoking status: Current Every Day Smoker -- 0.50 packs/day for 20 years    Types: Cigarettes  . Smokeless tobacco: Never Used  . Alcohol Use: No    Review of Systems Constitutional: No fever/chills Eyes: No visual changes. ENT: As above Cardiovascular: Denies chest pain. Respiratory: Denies shortness of breath. Gastrointestinal: No abdominal pain.  No nausea, no vomiting.  No diarrhea.  No constipation. Genitourinary: Negative for dysuria. Musculoskeletal: Negative for  back pain. Skin: Negative for rash. Neurological: Negative for headaches, focal weakness or numbness.  10-point ROS otherwise negative.  ____________________________________________   PHYSICAL EXAM:  VITAL SIGNS: ED Triage Vitals  Enc Vitals Group     BP 09/06/15 0059 127/64 mmHg     Pulse Rate 09/06/15 0059 71     Resp 09/06/15 0059 18     Temp 09/06/15 0059 98.4 F (36.9 C)     Temp Source 09/06/15 0059 Oral     SpO2 09/06/15 0059 99 %     Weight 09/06/15 0059 238 lb (107.956 kg)     Height 09/06/15 0059 5\' 5"  (1.651 m)     Head Cir --      Peak Flow --      Pain Score 09/06/15 0100 9     Pain Loc --      Pain Edu? --      Excl. in GC? --     Constitutional: Alert and oriented. Well appearing and in no acute distress. Eyes: Conjunctivae are normal. PERRL. EOMI. Head: Atraumatic.Normal tympanic membranes bilaterally. Nose: Mild amount of clear rhinorrhea to the bilateral nares. Mouth/Throat: Mucous membranes are moist.  Oropharynx non-erythematous. Poor dentition throughout with tooth erosion especially prominent to the left mandibular molars. No swelling, fluctuance or pus visualized. Neck: No stridor.   Cardiovascular: Normal rate, regular rhythm. Grossly normal heart sounds.   Respiratory: Normal respiratory effort.  No retractions. Lungs CTAB. Gastrointestinal: Soft and nontender. No distention.  Musculoskeletal: No lower extremity tenderness nor edema.  No joint effusions. Neurologic:  Normal speech and language. No gross focal neurologic deficits are appreciated.  Skin:  Skin is warm, dry and intact. No rash noted. Psychiatric: Mood and affect are normal. Speech and behavior are normal.  ____________________________________________   LABS (all labs ordered are listed, but only abnormal results are displayed)  Labs Reviewed  POCT RAPID STREP A    ____________________________________________  EKG   ____________________________________________  RADIOLOGY   ____________________________________________   PROCEDURES   ____________________________________________   INITIAL IMPRESSION / ASSESSMENT AND PLAN / ED COURSE  Pertinent labs & imaging results that were available during my care of the patient were reviewed by me and considered in my medical decision making (  see chart for details).  Patient likely with viral upper respiratory infection causing eustachian tube dysfunction as well as postnasal drip with sore throat. Also with poor dentition. We'll give antibiotics for dental caries. Also recommended the patient continue with ibuprofen as well as add hot tea with honey for relief of her symptoms. ____________________________________________   FINAL CLINICAL IMPRESSION(S) / ED DIAGNOSES  Upper respiratory infection. Dental caries.    NEW MEDICATIONS STARTED DURING THIS VISIT:  New Prescriptions   No medications on file     Note:  This document was prepared using Dragon voice recognition software and may include unintentional dictation errors.    Myrna Blazer, MD 09/06/15 0401  Given list of dental clinics for follow-up.  Myrna Blazer, MD 09/06/15 [phone]

## 2015-09-06 NOTE — ED Notes (Signed)
Pt reports sore throat, cough, sinus drainage.  Sx for 2 days.

## 2015-09-06 NOTE — Discharge Instructions (Signed)
Dental Caries Dental caries is tooth decay. This decay can cause a hole in teeth (cavity) that can get bigger and deeper over time. HOME CARE  Brush and floss your teeth. Do this at least two times a day.  Use a fluoride toothpaste.  Use a mouth rinse if told by your dentist or doctor.  Eat less sugary and starchy foods. Drink less sugary drinks.  Avoid snacking often on sugary and starchy foods. Avoid sipping often on sugary drinks.  Keep regular checkups and cleanings with your dentist.  Use fluoride supplements if told by your dentist or doctor.  Allow fluoride to be applied to teeth if told by your dentist or doctor.   This information is not intended to replace advice given to you by your health care provider. Make sure you discuss any questions you have with your health care provider.   Document Released: 12/18/2007 Document Revised: 03/31/2014 Document Reviewed: 03/12/2012 Elsevier Interactive Patient Education 2016 Elsevier Inc.  Upper Respiratory Infection, Adult Most upper respiratory infections (URIs) are caused by a virus. A URI affects the nose, throat, and upper air passages. The most common type of URI is often called "the common cold." HOME CARE   Take medicines only as told by your doctor.  Gargle warm saltwater or take cough drops to comfort your throat as told by your doctor.  Use a warm mist humidifier or inhale steam from a shower to increase air moisture. This may make it easier to breathe.  Drink enough fluid to keep your pee (urine) clear or pale yellow.  Eat soups and other clear broths.  Have a healthy diet.  Rest as needed.  Go back to work when your fever is gone or your doctor says it is okay.  You may need to stay home longer to avoid giving your URI to others.  You can also wear a face mask and wash your hands often to prevent spread of the virus.  Use your inhaler more if you have asthma.  Do not use any tobacco products, including  cigarettes, chewing tobacco, or electronic cigarettes. If you need help quitting, ask your doctor. GET HELP IF:  You are getting worse, not better.  Your symptoms are not helped by medicine.  You have chills.  You are getting more short of breath.  You have brown or red mucus.  You have yellow or brown discharge from your nose.  You have pain in your face, especially when you bend forward.  You have a fever.  You have puffy (swollen) neck glands.  You have pain while swallowing.  You have white areas in the back of your throat. GET HELP RIGHT AWAY IF:   You have very bad or constant:  Headache.  Ear pain.  Pain in your forehead, behind your eyes, and over your cheekbones (sinus pain).  Chest pain.  You have long-lasting (chronic) lung disease and any of the following:  Wheezing.  Long-lasting cough.  Coughing up blood.  A change in your usual mucus.  You have a stiff neck.  You have changes in your:  Vision.  Hearing.  Thinking.  Mood. MAKE SURE YOU:   Understand these instructions.  Will watch your condition.  Will get help right away if you are not doing well or get worse.   This information is not intended to replace advice given to you by your health care provider. Make sure you discuss any questions you have with your health care provider.  Document Released: 08/27/2007 Document Revised: 07/25/2014 Document Reviewed: 06/15/2013 Elsevier Interactive Patient Education [employer]2016 Elsevier Inc.

## 2015-11-02 ENCOUNTER — Encounter: Payer: Self-pay | Admitting: Emergency Medicine

## 2015-11-02 ENCOUNTER — Emergency Department
Admission: EM | Admit: 2015-11-02 | Discharge: 2015-11-02 | Disposition: A | Discharge status: Home / Self Care | Payer: Medicaid Other | Attending: Emergency Medicine | Admitting: Emergency Medicine

## 2015-11-02 DIAGNOSIS — Z79899 Other long term (current) drug therapy: Secondary | ICD-10-CM | POA: Diagnosis not present

## 2015-11-02 DIAGNOSIS — F17210 Nicotine dependence, cigarettes, uncomplicated: Secondary | ICD-10-CM | POA: Insufficient documentation

## 2015-11-02 DIAGNOSIS — M79672 Pain in left foot: Secondary | ICD-10-CM | POA: Diagnosis present

## 2015-11-02 DIAGNOSIS — Y999 Unspecified external cause status: Secondary | ICD-10-CM | POA: Diagnosis not present

## 2015-11-02 DIAGNOSIS — Y939 Activity, unspecified: Secondary | ICD-10-CM | POA: Insufficient documentation

## 2015-11-02 DIAGNOSIS — S93402A Sprain of unspecified ligament of left ankle, initial encounter: Secondary | ICD-10-CM | POA: Diagnosis not present

## 2015-11-02 DIAGNOSIS — Y929 Unspecified place or not applicable: Secondary | ICD-10-CM | POA: Insufficient documentation

## 2015-11-02 DIAGNOSIS — X501XXA Overexertion from prolonged static or awkward postures, initial encounter: Secondary | ICD-10-CM | POA: Diagnosis not present

## 2015-11-02 DIAGNOSIS — J45909 Unspecified asthma, uncomplicated: Secondary | ICD-10-CM | POA: Diagnosis not present

## 2015-11-02 MED ORDER — TRAMADOL HCL 50 MG PO TABS: 50.0000 mg | ORAL_TABLET | Freq: Four times a day (QID) | ORAL | 0 refills | Status: DC | PRN

## 2015-11-02 NOTE — ED Notes (Signed)
Pt fell 1 week ago and turned left ankle. Pt reports [facility] and had xrays performed, however was informed that she would not have her results until tomorrow. Pt was dx with sprain of left ankle. Pt reports she was told by [facility] by MD that she could barely feel a pulse in foot, and was worried about possibility of blood clot. Pt's left leg is edematous. Pt's leg is not warm, no area of redness seen. Pt reports entire left leg pain (which is how her blood clot present previously). Pt also reports a shooting from ankle up leg. Pt also reports hx of sciatica that runs down left leg. Pt's took 2 tylenol at 0900 today with no relief.

## 2015-11-02 NOTE — ED Provider Notes (Signed)
[facility] Emergency Department Provider Note ____________________________________________  Time seen: Approximately 8:31 PM  I have reviewed the triage vital signs and the nursing notes.   HISTORY  Chief Complaint Foot Pain    HPI Brianna Ashley is a 30 y.o. female who presents to the emergency department for evaluation of left ankle pain. She states that she went to urgent care today and they were "unable to find the pulse in my foot." She reports twisting her ankle about a week ago. She states that she was icing and elevating it and taking ibuprofen without relief, so she went to urgent care today. She states that they were getting ready to close and she feels as if they wanted to leave instead of seeing her. She denies numbness or tingling in her left foot or ankle. She denies paresthesias. She denies noting any color change in her left foot or ankle.  Past Medical History:  Diagnosis Date  . Asthma   . Carpal tunnel syndrome   . Factor 5 Leiden mutation, heterozygous (HCC)   . Migraine   . Seizures (HCC)     There are no active problems to display for this patient.   Past Surgical History:  Procedure Laterality Date  . ABSCESS DRAINAGE    . CESAREAN SECTION    . DILATION AND CURETTAGE OF UTERUS      Prior to Admission medications   Medication Sig Start Date End Date Taking? Authorizing Provider  butalbital-acetaminophen-caffeine (FIORICET) 50-325-40 MG per tablet Take 1-2 tablets by mouth every 6 (six) hours as needed for headache. Patient not taking: Reported on 06/13/2015 11/08/14 11/08/15  Myrna Blazer, MD  citalopram (CELEXA) 40 MG tablet Take 40 mg by mouth daily. 04/12/15   Historical Provider, MD  clindamycin (CLEOCIN) 300 MG capsule Take 1 capsule (300 mg total) by mouth 4 (four) times daily. Patient not taking: Reported on 06/13/2015 09/20/14   Renford Dills, NP  clindamycin (CLEOCIN) 300 MG capsule Take 1 capsule (300 mg  total) by mouth 3 (three) times daily. 07/16/15   Rebecka Apley, MD  clonazePAM (KLONOPIN) 1 MG tablet Take 1 tablet by mouth 3 (three) times daily. 08/01/14   Historical Provider, MD  cyclobenzaprine (FLEXERIL) 10 MG tablet Take 1 tablet (10 mg total) by mouth every 8 (eight) hours as needed for muscle spasms. 07/16/15   Rebecka Apley, MD  dicyclomine (BENTYL) 20 MG tablet Take 1 tablet (20 mg total) by mouth 3 (three) times daily as needed. 06/13/15   Myrna Blazer, MD  doxycycline (VIBRAMYCIN) 100 MG capsule Take 100 mg by mouth 2 (two) times daily.    Historical Provider, MD  ibuprofen (ADVIL,MOTRIN) 800 MG tablet Take 1 tablet (800 mg total) by mouth every 8 (eight) hours as needed for moderate pain. 07/20/15   Irean Hong, MD  lidocaine (LIDODERM) 5 % Place 1 patch onto the skin daily. Remove & Discard patch within 12 hours or as directed by MD 07/16/15 07/15/16  Rebecka Apley, MD  ondansetron (ZOFRAN) 4 MG tablet Take 1 tablet (4 mg total) by mouth daily as needed. 06/13/15   Myrna Blazer, MD  oxyCODONE-acetaminophen (ROXICET) 5-325 MG tablet Take 1 tablet by mouth every 4 (four) hours as needed for severe pain. 06/21/15   Irean Hong, MD  penicillin v potassium (VEETID) 500 MG tablet Take 1 tablet (500 mg total) by mouth 4 (four) times daily. 09/06/15   Myrna Blazer, MD  promethazine (  PHENERGAN) 25 MG tablet Take 25 mg by mouth every 6 (six) hours as needed for nausea or vomiting.    Historical Provider, MD  QUEtiapine (SEROQUEL) 25 MG tablet Take 25 mg by mouth 2 (two) times daily.    Historical Provider, MD  traMADol (ULTRAM) 50 MG tablet Take 1 tablet (50 mg total) by mouth every 6 (six) hours as needed. Patient not taking: Reported on 06/13/2015 04/30/15   Rebecka ApleyAllison P Webster, MD    Allergies Ambien [zolpidem tartrate]; Ambien [zolpidem tartrate]; Ritalin [methylphenidate hcl]; and Ritalin [methylphenidate hcl]  History reviewed. No pertinent family  history.  Social History Social History  Substance Use Topics  . Smoking status: Current Every Day Smoker    Packs/day: 1.00    Years: 20.00    Types: Cigarettes  . Smokeless tobacco: Never Used  . Alcohol use No    Review of Systems Constitutional: No recent illness. Cardiovascular: Denies chest pain or palpitations. Respiratory: Denies shortness of breath. Musculoskeletal: Pain in left ankle. Skin: Negative for rash, wound, lesion. Neurological: Negative for focal weakness or numbness.  ____________________________________________   PHYSICAL EXAM:  VITAL SIGNS: ED Triage Vitals  Enc Vitals Group     BP 11/02/15 2024 (!) 116/54     Pulse Rate 11/02/15 2024 72     Resp 11/02/15 2024 16     Temp 11/02/15 2024 98.2 F (36.8 C)     Temp src --      SpO2 11/02/15 2024 98 %     Weight 11/02/15 2025 240 lb (108.9 kg)     Height 11/02/15 2025 5\' 5"  (1.651 m)     Head Circumference --      Peak Flow --      Pain Score 11/02/15 2025 8     Pain Loc --      Pain Edu? --      Excl. in GC? --     Constitutional: Alert and oriented. Well appearing and in no acute distress. Eyes: Conjunctivae are normal. EOMI. Head: Atraumatic. Neck: No stridor.  Respiratory: Normal respiratory effort.   Musculoskeletal: ATFL pattern tenderness noted in the left ankle/foot; [place] Ankle Rules are negative.. Neurologic:  Normal speech and language. No gross focal neurologic deficits are appreciated. Speech is normal. No gait instability. Skin:  Skin is warm, dry and intact. Atraumatic. Normal temp and cap refill is <3. Psychiatric: Mood and affect are normal. Speech and behavior are normal.  ____________________________________________   LABS (all labs ordered are listed, but only abnormal results are displayed)  Labs Reviewed - No data to display ____________________________________________  RADIOLOGY  Not  indicated ____________________________________________   PROCEDURES  Procedure(s) performed: None   ____________________________________________   INITIAL IMPRESSION / ASSESSMENT AND PLAN / ED COURSE  Pertinent labs & imaging results that were available during my care of the patient were reviewed by me and considered in my medical decision making (see chart for details).  Unable to view the image on the x-ray the patient provided from urgent care. She was advised to avoid weight bearing until she gets her results. She was advised to follow up with the orthopedist for symptoms that are not improving over the week. She was advised to return to the ER for symptoms that change or worsen if unable to schedule an appointment. ____________________________________________   FINAL CLINICAL IMPRESSION(S) / ED DIAGNOSES  Final diagnoses:  None       Chinita PesterCari B Taneesha Edgin, FNP 11/02/15 2146    Jennye MoccasinBrian S Quigley, MD  11/02/15 2240  

## 2015-11-02 NOTE — ED Notes (Signed)
Reviewed d/c instructions, follow-up care and prescription with pt. Pt verbalized understanding 

## 2015-11-02 NOTE — ED Triage Notes (Signed)
Pt. States she tripped last week and injured lt. Ankle.  Pt. States continued pain throughout week.  Pt. States she went to urgent care today, patient stated Urgent Care could not find here pedi-pulse.  Strong predial pulse in lt. foot found in triage.  Pt. States pain from ankle "shoots" up leg at rest.  No deformity or color change noted in lt. Ankle.

## 2015-11-02 NOTE — Discharge Instructions (Signed)
I was unable to pull up your x-ray from the CD you provided. If urgent care does not call you tomorrow with the results, please call them. Avoid bearing any weight on the foot/ankle until you hear from them. Follow up with orthopedics for symptoms that are not improving over the week.  Return to the ER for symptoms that change or worsen if you are unable to schedule an appointment.

## 2015-11-02 NOTE — ED Triage Notes (Signed)
Pt c/o L foot pain x 1 week, states it is recurrent. Pt denies relief from her pain, exacerbated by weight-bearing and movement. Pt seen at urgent care and was told they were having difficulty finding her pulse in that foot.

## 2015-11-02 NOTE — ED Notes (Signed)
Pt reports hx of Factor V Leiden

## 2016-02-02 ENCOUNTER — Emergency Department: Payer: Medicaid Other

## 2016-02-02 ENCOUNTER — Encounter: Payer: Self-pay | Admitting: Urgent Care

## 2016-02-02 ENCOUNTER — Observation Stay
Admission: EM | Admit: 2016-02-02 | Discharge: 2016-02-03 | Disposition: A | Discharge status: Home / Self Care | Payer: Medicaid Other | Attending: Internal Medicine | Admitting: Internal Medicine

## 2016-02-02 DIAGNOSIS — D6851 Activated protein C resistance: Secondary | ICD-10-CM | POA: Insufficient documentation

## 2016-02-02 DIAGNOSIS — I82412 Acute embolism and thrombosis of left femoral vein: Principal | ICD-10-CM | POA: Insufficient documentation

## 2016-02-02 DIAGNOSIS — Z79899 Other long term (current) drug therapy: Secondary | ICD-10-CM | POA: Diagnosis not present

## 2016-02-02 DIAGNOSIS — E669 Obesity, unspecified: Secondary | ICD-10-CM | POA: Insufficient documentation

## 2016-02-02 DIAGNOSIS — Z7901 Long term (current) use of anticoagulants: Secondary | ICD-10-CM | POA: Diagnosis not present

## 2016-02-02 DIAGNOSIS — F17210 Nicotine dependence, cigarettes, uncomplicated: Secondary | ICD-10-CM | POA: Diagnosis not present

## 2016-02-02 DIAGNOSIS — Z9119 Patient's noncompliance with other medical treatment and regimen: Secondary | ICD-10-CM | POA: Insufficient documentation

## 2016-02-02 DIAGNOSIS — F419 Anxiety disorder, unspecified: Secondary | ICD-10-CM | POA: Diagnosis not present

## 2016-02-02 DIAGNOSIS — J45909 Unspecified asthma, uncomplicated: Secondary | ICD-10-CM | POA: Insufficient documentation

## 2016-02-02 DIAGNOSIS — I82409 Acute embolism and thrombosis of unspecified deep veins of unspecified lower extremity: Secondary | ICD-10-CM | POA: Diagnosis present

## 2016-02-02 HISTORY — DX: Acute embolism and thrombosis of unspecified deep veins of unspecified lower extremity: I82.409

## 2016-02-02 LAB — COMPREHENSIVE METABOLIC PANEL
ALBUMIN: 3.9 g/dL (ref 3.5–5.0)
ALT: 20 U/L (ref 14–54)
ANION GAP: 6 (ref 5–15)
AST: 20 U/L (ref 15–41)
Alkaline Phosphatase: 77 U/L (ref 38–126)
BILIRUBIN TOTAL: 0.5 mg/dL (ref 0.3–1.2)
BUN: 14 mg/dL (ref 6–20)
CO2: 24 mmol/L (ref 22–32)
Calcium: 8.8 mg/dL — ABNORMAL LOW (ref 8.9–10.3)
Chloride: 108 mmol/L (ref 101–111)
Creatinine, Ser: 0.67 mg/dL (ref 0.44–1.00)
GFR calc Af Amer: >60 mL/min (ref >60)
GLUCOSE: 103 mg/dL — AB (ref 65–99)
POTASSIUM: 3.7 mmol/L (ref 3.5–5.1)
Sodium: 138 mmol/L (ref 135–145)
Total Protein: 6.7 g/dL (ref 6.5–8.1)

## 2016-02-02 LAB — CBC
HEMATOCRIT: 36.9 % (ref 35.0–47.0)
Hemoglobin: 12.9 g/dL (ref 12.0–16.0)
MCH: 31.9 pg (ref 26.0–34.0)
MCHC: 35.1 g/dL (ref 32.0–36.0)
MCV: 90.8 fL (ref 80.0–100.0)
PLATELETS: 245 10*3/uL (ref 150–440)
RBC: 4.06 MIL/uL (ref 3.80–5.20)
RDW: 14.3 % (ref 11.5–14.5)
WBC: 11 10*3/uL (ref 3.6–11.0)

## 2016-02-02 LAB — TSH: TSH: 5.396 u[IU]/mL — ABNORMAL HIGH (ref 0.350–4.500)

## 2016-02-02 LAB — POCT PREGNANCY, URINE: PREG TEST UR: NEGATIVE

## 2016-02-02 MED ORDER — VITAMINS A & D EX OINT
TOPICAL_OINTMENT | Freq: Two times a day (BID) | CUTANEOUS | Status: DC | PRN
  Filled 2016-02-02: qty 113.4

## 2016-02-02 MED ORDER — CLONAZEPAM 0.5 MG PO TABS
1.0000 mg | ORAL_TABLET | Freq: Three times a day (TID) | ORAL | Status: DC
  Administered 2016-02-02 – 2016-02-03 (×4): 1 mg via ORAL
  Filled 2016-02-02 (×4): qty 2

## 2016-02-02 MED ORDER — DOCUSATE SODIUM 100 MG PO CAPS
100.0000 mg | ORAL_CAPSULE | Freq: Two times a day (BID) | ORAL | Status: DC
  Administered 2016-02-02 – 2016-02-03 (×3): 100 mg via ORAL
  Filled 2016-02-02 (×3): qty 1

## 2016-02-02 MED ORDER — ONDANSETRON HCL 4 MG/2ML IJ SOLN: 4.0000 mg | Freq: Four times a day (QID) | INTRAMUSCULAR | Status: DC | PRN

## 2016-02-02 MED ORDER — MORPHINE SULFATE (PF) 4 MG/ML IV SOLN
4.0000 mg | Freq: Once | INTRAVENOUS | Status: AC
  Administered 2016-02-02: 4 mg via INTRAVENOUS
  Filled 2016-02-02: qty 1

## 2016-02-02 MED ORDER — IOPAMIDOL (ISOVUE-370) INJECTION 76%
75.0000 mL | Freq: Once | INTRAVENOUS | Status: AC | PRN
Start: 2016-02-02 — End: 2016-02-02
  Administered 2016-02-02: 75 mL via INTRAVENOUS

## 2016-02-02 MED ORDER — CITALOPRAM HYDROBROMIDE 20 MG PO TABS
40.0000 mg | ORAL_TABLET | Freq: Every day | ORAL | Status: DC
  Administered 2016-02-02 – 2016-02-03 (×2): 40 mg via ORAL
  Filled 2016-02-02 (×2): qty 2

## 2016-02-02 MED ORDER — MORPHINE SULFATE (PF) 2 MG/ML IV SOLN: 2.0000 mg | INTRAVENOUS | Status: DC | PRN

## 2016-02-02 MED ORDER — ONDANSETRON HCL 4 MG PO TABS: 4.0000 mg | ORAL_TABLET | Freq: Four times a day (QID) | ORAL | Status: DC | PRN

## 2016-02-02 MED ORDER — ACETAMINOPHEN 650 MG RE SUPP: 650.0000 mg | Freq: Four times a day (QID) | RECTAL | Status: DC | PRN

## 2016-02-02 MED ORDER — OXYCODONE HCL 5 MG PO TABS
5.0000 mg | ORAL_TABLET | Freq: Four times a day (QID) | ORAL | Status: DC | PRN
  Administered 2016-02-02 – 2016-02-03 (×4): 5 mg via ORAL
  Filled 2016-02-02 (×4): qty 1

## 2016-02-02 MED ORDER — ENOXAPARIN SODIUM 120 MG/0.8ML ~~LOC~~ SOLN
1.0000 mg/kg | Freq: Once | SUBCUTANEOUS | Status: AC
  Administered 2016-02-02: 105 mg via SUBCUTANEOUS
  Filled 2016-02-02: qty 0.8

## 2016-02-02 MED ORDER — ACETAMINOPHEN 325 MG PO TABS: 650.0000 mg | ORAL_TABLET | Freq: Four times a day (QID) | ORAL | Status: DC | PRN

## 2016-02-02 MED ORDER — SODIUM CHLORIDE 0.9 % IV BOLUS (SEPSIS)
1000.0000 mL | Freq: Once | INTRAVENOUS | Status: AC
  Administered 2016-02-02: 1000 mL via INTRAVENOUS

## 2016-02-02 MED ORDER — ENOXAPARIN SODIUM 120 MG/0.8ML ~~LOC~~ SOLN
1.0000 mg/kg | Freq: Two times a day (BID) | SUBCUTANEOUS | Status: DC
  Administered 2016-02-02 – 2016-02-03 (×2): 105 mg via SUBCUTANEOUS
  Filled 2016-02-02 (×2): qty 0.8

## 2016-02-02 MED ORDER — PROMETHAZINE HCL 25 MG PO TABS: 25.0000 mg | ORAL_TABLET | Freq: Four times a day (QID) | ORAL | Status: DC | PRN

## 2016-02-02 MED ORDER — HYDROMORPHONE HCL 1 MG/ML IJ SOLN: 1.0000 mg | Freq: Once | INTRAMUSCULAR | Status: DC

## 2016-02-02 MED ORDER — MORPHINE SULFATE (PF) 4 MG/ML IV SOLN
2.0000 mg | Freq: Once | INTRAVENOUS | Status: AC
  Administered 2016-02-02: 2 mg via INTRAVENOUS
  Filled 2016-02-02: qty 1

## 2016-02-02 MED ORDER — QUETIAPINE FUMARATE 25 MG PO TABS
25.0000 mg | ORAL_TABLET | Freq: Two times a day (BID) | ORAL | Status: DC
  Administered 2016-02-02 – 2016-02-03 (×3): 25 mg via ORAL
  Filled 2016-02-02 (×3): qty 1

## 2016-02-02 MED ORDER — SODIUM CHLORIDE 0.9% FLUSH
3.0000 mL | Freq: Two times a day (BID) | INTRAVENOUS | Status: DC
  Administered 2016-02-02: 3 mL via INTRAVENOUS

## 2016-02-02 MED ORDER — DICYCLOMINE HCL 20 MG PO TABS
20.0000 mg | ORAL_TABLET | Freq: Three times a day (TID) | ORAL | Status: DC | PRN
  Filled 2016-02-02: qty 1

## 2016-02-02 MED ORDER — ONDANSETRON HCL 4 MG/2ML IJ SOLN
4.0000 mg | Freq: Once | INTRAMUSCULAR | Status: AC
  Administered 2016-02-02: 4 mg via INTRAVENOUS
  Filled 2016-02-02: qty 2

## 2016-02-02 NOTE — ED Provider Notes (Signed)
[facility] Emergency Department Provider Note    First MD Initiated Contact with Patient 02/02/16 [phone]     (approximate)  I have reviewed the triage vital signs and the nursing notes.   HISTORY  Chief Complaint Leg Pain and Leg Swelling    HPI Brianna Ashley is a 30 y.o. 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 Patient also admits to chest pain and dyspnea current pain score is 9 out of 10. Patient denies any fever   Past Medical History:  Diagnosis Date  . Asthma   . Carpal tunnel syndrome   . DVT of lower extremity (deep venous thrombosis) (HCC)   . Factor 5 Leiden mutation, heterozygous (HCC)   . Migraine   . Seizures [facility](HCC)     Patient Active Problem List   Diagnosis Date Noted  . DVT (deep venous thrombosis) (HCC) 02/02/2016    Past Surgical History:  Procedure Laterality Date  . ABSCESS DRAINAGE    . CESAREAN SECTION    . DILATION AND CURETTAGE OF UTERUS      Prior to Admission medications   Not on File    Allergies Ambien [zolpidem tartrate]; Ambien [zolpidem tartrate]; Ritalin [methylphenidate hcl]; and Ritalin [methylphenidate hcl]  No family history on file.  Social History Social History  Substance Use Topics  . Smoking status: Current Every Day Smoker    Packs/day: 1.00    Years: 20.00    Types: Cigarettes  . Smokeless tobacco: Never Used  . Alcohol use No    Review of Systems Constitutional: No fever/chills Eyes: No visual changes. ENT: No sore throat. Cardiovascular: Positive for chest pain. Respiratory: Positive for shortness of breath. Gastrointestinal: No abdominal pain.  No nausea, no vomiting.  No diarrhea.  No constipation. Genitourinary: Negative for dysuria. Musculoskeletal: Negative for back pain. Positive for left lower extremity pain and swelling Skin:  Negative for rash. Neurological: Negative for headaches, focal weakness or numbness.  10-point ROS otherwise negative.  ____________________________________________   PHYSICAL EXAM:  VITAL SIGNS: ED Triage Vitals  Enc Vitals Group     BP 02/02/16 0200 125/75     Pulse Rate 02/02/16 0530 77     Resp 02/02/16 0200 16     Temp 02/02/16 0200 97.9 F (36.6 C)     Temp Source 02/02/16 0200 Oral     SpO2 02/02/16 0200 99 %     Weight 02/02/16 0159 235 lb (106.6 kg)     Height 02/02/16 0159 5\' 5"  (1.651 m)     Head Circumference --      Peak Flow --      Pain Score 02/02/16 0203 10     Pain Loc --      Pain Edu? --      Excl. in GC? --     Constitutional: Alert and oriented. Apparent discomfort Eyes: Conjunctivae are normal. PERRL. EOMI. Head: Atraumatic. Mouth/Throat: Mucous membranes are moist.  Oropharynx non-erythematous. Neck: No stridor.  No meningeal signs.   Cardiovascular: Normal rate, regular rhythm. Good peripheral circulation. Grossly normal heart sounds. Respiratory: Normal respiratory effort.  No retractions. Lungs CTAB. Gastrointestinal: Soft and nontender. No distention.  Musculoskeletal: No lower extremity tenderness nor edema. No gross deformities of extremities. Neurologic:  Normal speech and language. No gross focal neurologic deficits are appreciated.  Skin:  Skin is warm, dry and intact. No  rash noted. Psychiatric: Mood and affect are normal. Speech and behavior are normal.  ____________________________________________   LABS (all labs ordered are listed, but only abnormal results are displayed)  Labs Reviewed  COMPREHENSIVE METABOLIC PANEL - Abnormal; Notable for the following:       Result Value   Glucose, Bld 103 (*)    Calcium 8.8 (*)    All other components within normal limits  CBC  POCT PREGNANCY, URINE    RADIOLOGY I, Smock N Aishani Kalis, personally viewed and evaluated these images (plain radiographs) as part of my medical decision  making, as well as reviewing the written report by the radiologist.  Ct Angio Chest Pe W And/or Wo Contrast  Result Date: 02/02/2016 CLINICAL DATA:  Left leg pain and swelling. Previous history of DVT and pulmonary embolus. Chest pain and dyspnea. EXAM: CT ANGIOGRAPHY CHEST WITH CONTRAST TECHNIQUE: Multidetector CT imaging of the chest was performed using the standard protocol during bolus administration of intravenous contrast. Multiplanar CT image reconstructions and MIPs were obtained to evaluate the vascular anatomy. CONTRAST:  75 mL Isovue 370 COMPARISON:  07/20/2015 FINDINGS: Cardiovascular: Satisfactory opacification of the pulmonary arteries to the segmental level. No evidence of pulmonary embolism. Normal heart size. No pericardial effusion. Mediastinum/Nodes: Lungs/Pleura: Lungs are clear. No pleural effusion or pneumothorax. Upper Abdomen: No acute abnormality. Musculoskeletal: No chest wall abnormality. No acute or significant osseous findings. Review of the MIP images confirms the above findings. IMPRESSION: No evidence of significant pulmonary embolus. No evidence of active pulmonary disease. Electronically Signed   By: Burman Nieves M.D.   On: 02/02/2016 05:10   US Venous Img Lower Unilateral Left  Result Date: 02/02/2016 CLINICAL DATA:  Left lower leg pain and swelling for 2 days. Shortness of breath. History of tobacco use. Previous DVT and pulmonary embolus. EXAM: Left LOWER EXTREMITY VENOUS DOPPLER ULTRASOUND TECHNIQUE: Gray-scale sonography with graded compression, as well as color Doppler and duplex ultrasound were performed to evaluate the lower extremity deep venous systems from the level of the common femoral vein and including the common femoral, femoral, profunda femoral, popliteal and calf veins including the posterior tibial, peroneal and gastrocnemius veins when visible. The superficial great saphenous vein was also interrogated. Spectral Doppler was utilized to evaluate  flow at rest and with distal augmentation maneuvers in the common femoral, femoral and popliteal veins. COMPARISON:  06/21/2015 FINDINGS: Contralateral Common Femoral Vein: Respiratory phasicity is normal and symmetric with the symptomatic side. No evidence of thrombus. Normal compressibility. Common Femoral Vein: Nonocclusive thrombus present in the distal common femoral vein with echogenic thrombus and incomplete compression. Saphenofemoral Junction: Focal thrombus demonstrated at the junction of the greater saphenous vein and common femoral vein. Profunda Femoral Vein: Nonocclusive echogenic thrombus is present with incomplete compression. Femoral Vein: Nonocclusive echogenic thrombus is present with incomplete compression and limited color flow. Popliteal Vein: Nonocclusive echogenic thrombus is present with incomplete compression and limited color flow. Calf Veins: Calf veins were not visualized. Superficial Great Saphenous Vein: Diffuse superficial venous thrombus demonstrated in the saphenous vein from the knee through to the mid calf region. Venous Reflux:  None. Other Findings:  None. IMPRESSION: Nonocclusive deep venous thrombus demonstrated from the distal common femoral vein through the popliteal vein. Profunda femoral vein and superficial greater saphenous veins also demonstrate thrombus. These results were called by telephone at the time of interpretation on 02/02/2016 at 5:04 am to Dr. Bayard Males , who verbally acknowledged these results. Electronically Signed   By: Marisa Cyphers.D.  On: 02/02/2016 05:06      Procedures   __   INITIAL IMPRESSION / ASSESSMENT AND PLAN / ED COURSE  Pertinent labs & imaging results that were available during my care of the patient were reviewed by me and considered in my medical decision making (see chart for details).  Patient received Lovenox 1 mg/kg. Patient discussed with Dr. Sheryle Haildiamond for hospital admission   Clinical Course      ____________________________________________  FINAL CLINICAL IMPRESSION(S) / ED DIAGNOSES  Final diagnoses:  Acute deep vein thrombosis (DVT) of femoral vein of left lower extremity (HCC)     MEDICATIONS GIVEN DURING THIS VISIT:  Medications  HYDROmorphone (DILAUDID) injection 1 mg (not administered)  morphine 4 MG/ML injection 4 mg (4 mg Intravenous Given 02/02/16 0510)  ondansetron (ZOFRAN) injection 4 mg (4 mg Intravenous Given 02/02/16 0510)  iopamidol (ISOVUE-370) 76 % injection 75 mL (75 mLs Intravenous Contrast Given 02/02/16 0434)  enoxaparin (LOVENOX) injection 105 mg (105 mg Subcutaneous Given 02/02/16 0535)     NEW OUTPATIENT MEDICATIONS STARTED DURING THIS VISIT:  New Prescriptions   No medications on file    Modified Medications   No medications on file    Discontinued Medications   CITALOPRAM (CELEXA) 40 MG TABLET    Take 40 mg by mouth daily.   CLINDAMYCIN (CLEOCIN) 300 MG CAPSULE    Take 1 capsule (300 mg total) by mouth 4 (four) times daily.   CLINDAMYCIN (CLEOCIN) 300 MG CAPSULE    Take 1 capsule (300 mg total) by mouth 3 (three) times daily.   CLONAZEPAM (KLONOPIN) 1 MG TABLET    Take 1 tablet by mouth 3 (three) times daily.   DICYCLOMINE (BENTYL) 20 MG TABLET    Take 1 tablet (20 mg total) by mouth 3 (three) times daily as needed.   DOXYCYCLINE (VIBRAMYCIN) 100 MG CAPSULE    Take 100 mg by mouth 2 (two) times daily.   LIDOCAINE (LIDODERM) 5 %    Place 1 patch onto the skin daily. Remove & Discard patch within 12 hours or as directed by MD   ONDANSETRON (ZOFRAN) 4 MG TABLET    Take 1 tablet (4 mg total) by mouth daily as needed.   PENICILLIN V POTASSIUM (VEETID) 500 MG TABLET    Take 1 tablet (500 mg total) by mouth 4 (four) times daily.   PROMETHAZINE (PHENERGAN) 25 MG TABLET    Take 25 mg by mouth every 6 (six) hours as needed for nausea or vomiting.   QUETIAPINE (SEROQUEL) 25 MG TABLET    Take 25 mg by mouth 2 (two) times daily.   TRAMADOL  (ULTRAM) 50 MG TABLET    Take 1 tablet (50 mg total) by mouth every 6 (six) hours as needed.     Note:  This document was prepared using Dragon voice recognition software and may include unintentional dictation errors.    Darci Currentandolph N Dajon Rowe, MD 02/02/16 [id number]

## 2016-02-02 NOTE — ED Notes (Signed)
Pt has history of DVT after pregnancy in 2008, pt reports that she was on lovenox, but lost health insurance and was unable to pay for it.  Pt reports that PCP told her after she got insurance back they told her that she should "just take 2 aspirin a day and that it would do the same thing".  Pt also reports having a "blood clot in my lungs" in 2009 after her 2nd pregnancy.

## 2016-02-02 NOTE — H&P (Signed)
Brianna Ashley is an 30 y.o. female.   Chief Complaint: Leg pain and swelling HPI: The patient with past medical history of factor V Leiden deficiency presents emergency department complaining of left lower extremity pain and swelling. Patient states that her leg has been progressively hurting more over the last day and a half. She denies chest pain, shortness of breath or fevers. Ultrasound of her leg revealed extensive clotting of the left femoral to popliteal veins. CT of her chest was negative for pulmonary embolus. She was started on therapeutic Lovenox but did not have adequate pain relief and was admitted to the hospital service for analgesia.  Past Medical History:  Diagnosis Date  . Asthma   . Carpal tunnel syndrome   . DVT of lower extremity (deep venous thrombosis) ([place])   . Factor 5 Leiden mutation, heterozygous ([place])   . Migraine   . Seizures ([place])     Past Surgical History:  Procedure Laterality Date  . ABSCESS DRAINAGE    . CESAREAN SECTION    . DILATION AND CURETTAGE OF UTERUS      Family History  Problem Relation Age of Onset  . Factor V Leiden deficiency Mother     deceased of drug overdose   Social History:  reports that she has been smoking Cigarettes.  She has a 20.00 pack-year smoking history. She has never used smokeless tobacco. She reports that she does not drink alcohol or use drugs.  Allergies:  Allergies  Allergen Reactions  . Ambien [Zolpidem Tartrate] Other (See Comments)    death  . Ambien [Zolpidem Tartrate]   . Ritalin [Methylphenidate Hcl] Other (See Comments)    death  . Ritalin [Methylphenidate Hcl]     Prior to Admission medications   Not on File     Results for orders placed or performed during the hospital encounter of 02/02/16 (from the past 48 hour(s))  CBC     Status: None   Collection Time: 02/02/16  3:21 AM  Result Value Ref Range   WBC 11.0 3.6 - 11.0 K/uL   RBC 4.06 3.80 - 5.20 MIL/uL   Hemoglobin 12.9 12.0 - 16.0 g/dL    HCT 36.9 35.0 - 47.0 %   MCV 90.8 80.0 - 100.0 fL   MCH 31.9 26.0 - 34.0 pg   MCHC 35.1 32.0 - 36.0 g/dL   RDW 14.3 11.5 - 14.5 %   Platelets 245 150 - 440 K/uL  Comprehensive metabolic panel     Status: Abnormal   Collection Time: 02/02/16  3:21 AM  Result Value Ref Range   Sodium 138 135 - 145 mmol/L   Potassium 3.7 3.5 - 5.1 mmol/L   Chloride 108 101 - 111 mmol/L   CO2 24 22 - 32 mmol/L   Glucose, Bld 103 (H) 65 - 99 mg/dL   BUN 14 6 - 20 mg/dL   Creatinine, Ser 0.67 0.44 - 1.00 mg/dL   Calcium 8.8 (L) 8.9 - 10.3 mg/dL   Total Protein 6.7 6.5 - 8.1 g/dL   Albumin 3.9 3.5 - 5.0 g/dL   AST 20 15 - 41 U/L   ALT 20 14 - 54 U/L   Alkaline Phosphatase 77 38 - 126 U/L   Total Bilirubin 0.5 0.3 - 1.2 mg/dL   GFR calc non Af Amer >60 >60 mL/min   GFR calc Af Amer >60 >60 mL/min    Comment: (NOTE) The eGFR has been calculated using the CKD EPI equation. This calculation has not  been validated in all clinical situations. eGFR's persistently <60 mL/min signify possible Chronic Kidney Disease.    Anion gap 6 5 - 15  Pregnancy, urine POC     Status: None   Collection Time: 02/02/16  3:29 AM  Result Value Ref Range   Preg Test, Ur NEGATIVE NEGATIVE    Comment:        THE SENSITIVITY OF THIS METHODOLOGY IS >24 mIU/mL    Ct Angio Chest Pe W And/or Wo Contrast  Result Date: 02/02/2016 CLINICAL DATA:  Left leg pain and swelling. Previous history of DVT and pulmonary embolus. Chest pain and dyspnea. EXAM: CT ANGIOGRAPHY CHEST WITH CONTRAST TECHNIQUE: Multidetector CT imaging of the chest was performed using the standard protocol during bolus administration of intravenous contrast. Multiplanar CT image reconstructions and MIPs were obtained to evaluate the vascular anatomy. CONTRAST:  75 mL Isovue 370 COMPARISON:  07/20/2015 FINDINGS: Cardiovascular: Satisfactory opacification of the pulmonary arteries to the segmental level. No evidence of pulmonary embolism. Normal heart size. No  pericardial effusion. Mediastinum/Nodes: Lungs/Pleura: Lungs are clear. No pleural effusion or pneumothorax. Upper Abdomen: No acute abnormality. Musculoskeletal: No chest wall abnormality. No acute or significant osseous findings. Review of the MIP images confirms the above findings. IMPRESSION: No evidence of significant pulmonary embolus. No evidence of active pulmonary disease. Electronically Signed   By: Lucienne Capers M.D.   On: 02/02/2016 05:10   US Venous Img Lower Unilateral Left  Result Date: 02/02/2016 CLINICAL DATA:  Left lower leg pain and swelling for 2 days. Shortness of breath. History of tobacco use. Previous DVT and pulmonary embolus. EXAM: Left LOWER EXTREMITY VENOUS DOPPLER ULTRASOUND TECHNIQUE: Gray-scale sonography with graded compression, as well as color Doppler and duplex ultrasound were performed to evaluate the lower extremity deep venous systems from the level of the common femoral vein and including the common femoral, femoral, profunda femoral, popliteal and calf veins including the posterior tibial, peroneal and gastrocnemius veins when visible. The superficial great saphenous vein was also interrogated. Spectral Doppler was utilized to evaluate flow at rest and with distal augmentation maneuvers in the common femoral, femoral and popliteal veins. COMPARISON:  06/21/2015 FINDINGS: Contralateral Common Femoral Vein: Respiratory phasicity is normal and symmetric with the symptomatic side. No evidence of thrombus. Normal compressibility. Common Femoral Vein: Nonocclusive thrombus present in the distal common femoral vein with echogenic thrombus and incomplete compression. Saphenofemoral Junction: Focal thrombus demonstrated at the junction of the greater saphenous vein and common femoral vein. Profunda Femoral Vein: Nonocclusive echogenic thrombus is present with incomplete compression. Femoral Vein: Nonocclusive echogenic thrombus is present with incomplete compression and limited  color flow. Popliteal Vein: Nonocclusive echogenic thrombus is present with incomplete compression and limited color flow. Calf Veins: Calf veins were not visualized. Superficial Great Saphenous Vein: Diffuse superficial venous thrombus demonstrated in the saphenous vein from the knee through to the mid calf region. Venous Reflux:  None. Other Findings:  None. IMPRESSION: Nonocclusive deep venous thrombus demonstrated from the distal common femoral vein through the popliteal vein. Profunda femoral vein and superficial greater saphenous veins also demonstrate thrombus. These results were called by telephone at the time of interpretation on 02/02/2016 at 5:04 am to Dr. Marjean Donna , who verbally acknowledged these results. Electronically Signed   By: Lucienne Capers M.D.   On: 02/02/2016 05:06    Review of Systems  Constitutional: Negative for chills and fever.  HENT: Negative for sore throat and tinnitus.   Eyes: Negative for blurred vision and redness.  Respiratory: Negative for cough and shortness of breath.   Cardiovascular: Negative for chest pain, palpitations, orthopnea and PND.  Gastrointestinal: Negative for abdominal pain, diarrhea, nausea and vomiting.  Genitourinary: Negative for dysuria, frequency and urgency.  Musculoskeletal: Negative for joint pain and myalgias.  Skin: Negative for rash.       No lesions  Neurological: Negative for speech change, focal weakness and weakness.  Endo/Heme/Allergies: Does not bruise/bleed easily.       No temperature intolerance  Psychiatric/Behavioral: Negative for depression and suicidal ideas.    Blood pressure (!) 110/54, pulse 71, temperature 97.9 F (36.6 C), temperature source Oral, resp. rate 16, height _0  (1.651 m), weight 106.6 kg (235 lb), last menstrual period 01/07/2016, SpO2 100 %. Physical Exam  Vitals reviewed. Constitutional: She is oriented to person, place, and time. She appears well-developed and well-nourished. No  distress.  HENT:  Head: Normocephalic and atraumatic.  Mouth/Throat: Oropharynx is clear and moist.  Eyes: Conjunctivae and EOM are normal. Pupils are equal, round, and reactive to light. No scleral icterus.  Neck: Normal range of motion. Neck supple. No JVD present. No tracheal deviation present. No thyromegaly present.  Cardiovascular: Normal rate, regular rhythm and normal heart sounds.  Exam reveals no gallop and no friction rub.   No murmur heard. Respiratory: Effort normal and breath sounds normal.  GI: Soft. Bowel sounds are normal. She exhibits no distension. There is no tenderness.  Genitourinary:  Genitourinary Comments: Deferred  Musculoskeletal: Normal range of motion. She exhibits no edema.  Lymphadenopathy:    She has no cervical adenopathy.  Neurological: She is alert and oriented to person, place, and time. No cranial nerve deficit. She exhibits normal muscle tone.  Skin: Skin is warm and dry. No rash noted. No erythema.  Psychiatric: She has a normal mood and affect. Her behavior is normal. Judgment and thought content normal.     Assessment/Plan This is a 30 year old female admitted for extensive DVT of the left lower extremity. 1. DVT: Secondary to hypercoagulability from-5 Leiden deficiency. Continue therapeutic Lovenox. The patient will need discharge planning to arrange anticoagulation therapy for home. She has some difficulty finding a primary care physician at this time. 2. Home medications: We'll need to contact previous pharmacy for an accurate list of the patient's medications that she has not filled in some time. 3. DVT prophylaxis: For full dose anticoagulation therapy as above 4. GI prophylaxis: None The patient is a full code. Time spent on admission was inpatient care possibly 45 minutes  Harrie Foreman, MD 02/02/2016, 7:22 AM

## 2016-02-02 NOTE — ED Triage Notes (Signed)
Patient presents with c/o LLE pain and swelling. Painful to bear weight. Patient reports h/o DVT from hip to calf in 2008; states, "it feels the same".

## 2016-02-02 NOTE — Progress Notes (Signed)
[facility] [place] at [facility]   PATIENT NAME: Brianna Ashley    MR#:  [id number]  DATE OF BIRTH:  [birth year]  SUBJECTIVE:  CHIEF COMPLAINT:  Pt is sleepy but still c/o leg pain and asking more pain meds for pain control, answers ok she was on lovenox for DVT but for pain control admitted   REVIEW OF SYSTEMS:  CONSTITUTIONAL: No fever, fatigue or weakness.  EYES: No blurred or double vision.  EARS, NOSE, AND THROAT: No tinnitus or ear pain.  RESPIRATORY: No cough, shortness of breath, wheezing or hemoptysis.  CARDIOVASCULAR: No chest pain, orthopnea, edema.  GASTROINTESTINAL: No nausea, vomiting, diarrhea or abdominal pain.  GENITOURINARY: No dysuria, hematuria.  ENDOCRINE: No polyuria, nocturia,  HEMATOLOGY: No anemia, easy bruising or bleeding SKIN: No rash or lesion. MUSCULOSKELETAL: reports left leg pain No joint pain or arthritis.   NEUROLOGIC: No tingling, numbness, weakness.  PSYCHIATRY: No anxiety or depression.   DRUG ALLERGIES:   Allergies  Allergen Reactions  . Ambien [Zolpidem Tartrate] Other (See Comments)    death  . Ambien [Zolpidem Tartrate]   . Ritalin [Methylphenidate Hcl] Other (See Comments)    death  . Ritalin [Methylphenidate Hcl]     VITALS:  Blood pressure 116/77, pulse 75, temperature 97.6 F (36.4 C), temperature source Oral, resp. rate 18, height 5\' 5"  (1.651 m), weight 104.5 kg (230 lb 6.4 oz), last menstrual period 01/07/2016, SpO2 98 %.  PHYSICAL EXAMINATION:  GENERAL:  30 y.o.-year-old patient lying in the bed with no acute distress.  EYES: Pupils equal, round, reactive to light and accommodation. No scleral icterus. Extraocular muscles intact.  HEENT: Head atraumatic, normocephalic. Oropharynx and nasopharynx clear.  NECK:  Supple, no jugular venous distention. No thyroid enlargement, no tenderness.  LUNGS: Normal breath sounds bilaterally, no wheezing, rales,rhonchi or crepitation. No use of accessory muscles  of respiration.  CARDIOVASCULAR: S1, S2 normal. No murmurs, rubs, or gallops.  ABDOMEN: Soft, nontender, nondistended. Bowel sounds present. No organomegaly or mass.  EXTREMITIES: LLE- calf tenderness, tatoos No pedal edema, cyanosis, or clubbing.  NEUROLOGIC: Cranial nerves II through XII are intact. Muscle strength 5/5 in all extremities. Sensation intact. Gait not checked.  PSYCHIATRIC: The patient is alert and oriented x 3.  SKIN: No obvious rash, lesion, or ulcer.    LABORATORY PANEL:   CBC  Recent Labs Lab 02/02/16 0321  WBC 11.0  HGB 12.9  HCT 36.9  PLT 245   ------------------------------------------------------------------------------------------------------------------  Chemistries   Recent Labs Lab 02/02/16 0321  NA 138  K 3.7  CL 108  CO2 24  GLUCOSE 103*  BUN 14  CREATININE 0.67  CALCIUM 8.8*  AST 20  ALT 20  ALKPHOS 77  BILITOT 0.5   ------------------------------------------------------------------------------------------------------------------  Cardiac Enzymes No results for input(s): TROPONINI in the last 168 hours. ------------------------------------------------------------------------------------------------------------------  RADIOLOGY:  Ct Angio Chest Pe W And/or Wo Contrast  Result Date: 02/02/2016 CLINICAL DATA:  Left leg pain and swelling. Previous history of DVT and pulmonary embolus. Chest pain and dyspnea. EXAM: CT ANGIOGRAPHY CHEST WITH CONTRAST TECHNIQUE: Multidetector CT imaging of the chest was performed using the standard protocol during bolus administration of intravenous contrast. Multiplanar CT image reconstructions and MIPs were obtained to evaluate the vascular anatomy. CONTRAST:  75 mL Isovue 370 COMPARISON:  07/20/2015 FINDINGS: Cardiovascular: Satisfactory opacification of the pulmonary arteries to the segmental level. No evidence of pulmonary embolism. Normal heart size. No pericardial effusion. Mediastinum/Nodes:  Lungs/Pleura: Lungs are clear. No pleural effusion or  pneumothorax. Upper Abdomen: No acute abnormality. Musculoskeletal: No chest wall abnormality. No acute or significant osseous findings. Review of the MIP images confirms the above findings. IMPRESSION: No evidence of significant pulmonary embolus. No evidence of active pulmonary disease. Electronically Signed   By: Burman NievesWilliam  Stevens M.D.   On: 02/02/2016 05:10   [place] Venous Img Lower Unilateral Left  Result Date: 02/02/2016 CLINICAL DATA:  Left lower leg pain and swelling for 2 days. Shortness of breath. History of tobacco use. Previous DVT and pulmonary embolus. EXAM: Left LOWER EXTREMITY VENOUS DOPPLER ULTRASOUND TECHNIQUE: Gray-scale sonography with graded compression, as well as color Doppler and duplex ultrasound were performed to evaluate the lower extremity deep venous systems from the level of the common femoral vein and including the common femoral, femoral, profunda femoral, popliteal and calf veins including the posterior tibial, peroneal and gastrocnemius veins when visible. The superficial great saphenous vein was also interrogated. Spectral Doppler was utilized to evaluate flow at rest and with distal augmentation maneuvers in the common femoral, femoral and popliteal veins. COMPARISON:  06/21/2015 FINDINGS: Contralateral Common Femoral Vein: Respiratory phasicity is normal and symmetric with the symptomatic side. No evidence of thrombus. Normal compressibility. Common Femoral Vein: Nonocclusive thrombus present in the distal common femoral vein with echogenic thrombus and incomplete compression. Saphenofemoral Junction: Focal thrombus demonstrated at the junction of the greater saphenous vein and common femoral vein. Profunda Femoral Vein: Nonocclusive echogenic thrombus is present with incomplete compression. Femoral Vein: Nonocclusive echogenic thrombus is present with incomplete compression and limited color flow. Popliteal Vein: Nonocclusive  echogenic thrombus is present with incomplete compression and limited color flow. Calf Veins: Calf veins were not visualized. Superficial Great Saphenous Vein: Diffuse superficial venous thrombus demonstrated in the saphenous vein from the knee through to the mid calf region. Venous Reflux:  None. Other Findings:  None. IMPRESSION: Nonocclusive deep venous thrombus demonstrated from the distal common femoral vein through the popliteal vein. Profunda femoral vein and superficial greater saphenous veins also demonstrate thrombus. These results were called by telephone at the time of interpretation on 02/02/2016 at 5:04 am to Dr. Bayard MalesANDOLPH BROWN , who verbally acknowledged these results. Electronically Signed   By: Burman NievesWilliam  Stevens M.D.   On: 02/02/2016 05:06    EKG:   Orders placed or performed in visit on 07/20/15  . EKG 12-Lead    ASSESSMENT AND PLAN:   This is a 30 year old female admitted for extensive DVT of the left lower extremity.  1. 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  Continue therapeutic Lovenox. Oncology consult  Pain meds prn- hold for sedation  The patient will need discharge planning to arrange anticoagulation therapy for home.  She has some difficulty finding a primary care physician at this time. Consult CM  2. Noncompliance   : We'll need to contact previous pharmacy for an accurate list of the patient's medications that she has not filled in some time.  3. Obesity- needs lifestyle changes once medically stable     All the records are reviewed and case discussed with Care Management/Social Workerr. Management plans discussed with the patient, family and they are in agreement.  CODE STATUS: fc  TOTAL TIME TAKING CARE OF THIS PATIENT: 36 minutes.   POSSIBLE D/C IN 1-2 DAYS, DEPENDING ON CLINICAL CONDITION.  Note: This dictation was prepared with Dragon dictation along with smaller phrase technology. Any transcriptional errors  that result from this process are unintentional.   Ramonita LabGouru, Teja Judice M.D on 02/02/2016 at 5:45 PM  Between 7am to 6pm - Pager - [phone] After 6pm go to [email or web address] - password [email or web address]  [facility]  Office  [phone]  CC: Primary care physician; No PCP Per Patient

## 2016-02-02 NOTE — ED Notes (Signed)
Pt arrived limping c/o pain to left leg, started in lower leg and spread up, pt reports hx of admission for blood clots, denies blood thinners or birth control, smoker.

## 2016-02-03 DIAGNOSIS — R569 Unspecified convulsions: Secondary | ICD-10-CM

## 2016-02-03 DIAGNOSIS — J45909 Unspecified asthma, uncomplicated: Secondary | ICD-10-CM

## 2016-02-03 DIAGNOSIS — D6851 Activated protein C resistance: Secondary | ICD-10-CM

## 2016-02-03 DIAGNOSIS — G5600 Carpal tunnel syndrome, unspecified upper limb: Secondary | ICD-10-CM

## 2016-02-03 DIAGNOSIS — F17210 Nicotine dependence, cigarettes, uncomplicated: Secondary | ICD-10-CM

## 2016-02-03 DIAGNOSIS — I82412 Acute embolism and thrombosis of left femoral vein: Secondary | ICD-10-CM | POA: Diagnosis not present

## 2016-02-03 DIAGNOSIS — Z8669 Personal history of other diseases of the nervous system and sense organs: Secondary | ICD-10-CM

## 2016-02-03 DIAGNOSIS — Z86718 Personal history of other venous thrombosis and embolism: Secondary | ICD-10-CM

## 2016-02-03 DIAGNOSIS — Z7901 Long term (current) use of anticoagulants: Secondary | ICD-10-CM

## 2016-02-03 LAB — CBC
HEMATOCRIT: 37 % (ref 35.0–47.0)
HEMOGLOBIN: 12.5 g/dL (ref 12.0–16.0)
MCH: 31.2 pg (ref 26.0–34.0)
MCHC: 33.7 g/dL (ref 32.0–36.0)
MCV: 92.3 fL (ref 80.0–100.0)
Platelets: 233 10*3/uL (ref 150–440)
RBC: 4.01 MIL/uL (ref 3.80–5.20)
RDW: 14.7 % — AB (ref 11.5–14.5)
WBC: 8 10*3/uL (ref 3.6–11.0)

## 2016-02-03 LAB — HEMOGLOBIN A1C
Hgb A1c MFr Bld: 5.5 % (ref 4.8–5.6)
Mean Plasma Glucose: 111 mg/dL

## 2016-02-03 MED ORDER — APIXABAN 5 MG PO TABS: 5.0000 mg | ORAL_TABLET | Freq: Two times a day (BID) | ORAL | Status: DC

## 2016-02-03 MED ORDER — DOCUSATE SODIUM 100 MG PO CAPS: 100.0000 mg | ORAL_CAPSULE | Freq: Two times a day (BID) | ORAL | 0 refills | Status: DC | PRN

## 2016-02-03 MED ORDER — QUETIAPINE FUMARATE 25 MG PO TABS: 25.0000 mg | ORAL_TABLET | Freq: Two times a day (BID) | ORAL | 0 refills | Status: DC

## 2016-02-03 MED ORDER — APIXABAN 5 MG PO TABS: 10.0000 mg | ORAL_TABLET | Freq: Two times a day (BID) | ORAL | 0 refills | Status: DC

## 2016-02-03 MED ORDER — ACETAMINOPHEN 325 MG PO TABS: 650.0000 mg | ORAL_TABLET | Freq: Four times a day (QID) | ORAL | Status: DC | PRN

## 2016-02-03 MED ORDER — CLONAZEPAM 1 MG PO TABS: 1.0000 mg | ORAL_TABLET | Freq: Three times a day (TID) | ORAL | 0 refills | Status: DC

## 2016-02-03 MED ORDER — OXYCODONE HCL 5 MG PO TABS: 5.0000 mg | ORAL_TABLET | Freq: Four times a day (QID) | ORAL | 0 refills | Status: DC | PRN

## 2016-02-03 MED ORDER — CITALOPRAM HYDROBROMIDE 40 MG PO TABS: 40.0000 mg | ORAL_TABLET | Freq: Every day | ORAL | 0 refills | Status: DC

## 2016-02-03 MED ORDER — APIXABAN 5 MG PO TABS: 10.0000 mg | ORAL_TABLET | Freq: Two times a day (BID) | ORAL | Status: DC

## 2016-02-03 NOTE — Discharge Instructions (Signed)
Activity as toleratedand no strenuous exercise Low-fat diet Follow-up with primary care physician or [facility] in a week Follow-up with Dr. Merlene [facility] onc[facility] in 2 weeks Follow-up with psychiatry in a week Complaint with the medication

## 2016-02-03 NOTE — Discharge Summary (Signed)
[facility] - [place] at [facility]   PATIENT NAME: Brianna Ashley    MR#:  [id number]  DATE OF BIRTH:  11/08/1985  DATE OF ADMISSION:  02/02/2016 ADMITTING PHYSICIAN: Arnaldo Natal, MD  DATE OF DISCHARGE: 02/03/16 PRIMARY CARE PHYSICIAN: No PCP Per Patient    ADMISSION DIAGNOSIS:  Acute deep vein thrombosis (DVT) of femoral vein of left lower extremity (HCC) [I82.412]  DISCHARGE DIAGNOSIS:  Recurrent DVT Factor V Leiden deficiency  SECONDARY DIAGNOSIS:   Past Medical History:  Diagnosis Date  . Asthma   . Carpal tunnel syndrome   . DVT of lower extremity (deep venous thrombosis) (HCC)   . Factor 5 Leiden mutation, heterozygous (HCC)   . Migraine   . Seizures [facility])     HOSPITAL COURSE:   Please review history and physical for details The patient with past medical history of factor V Leiden deficiency presents emergency department complaining of left lower extremity pain and swelling. Patient states that her leg has been progressively hurting more over the last day and a half. She denies chest pain, shortness of breath or fevers. Ultrasound of her leg revealed extensive clotting of the left femoral to popliteal veins. CT of her chest was negative for pulmonary embolus. She was started on therapeutic Lovenox but did not have adequate pain relief and was admitted to the hospital service for analgesia.  This is a 30 year old female admitted for extensive DVT of the left lower extremity.  1. 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  Continued therapeutic Lovenox during the hospital course will discharge home with a request 10 mg twice a day for a week and then 5 mg twice a day Oncology consult and to conditions appreciated. Outpatient follow-up with Dr. Merlene Pulling Pain meds prn- hold for sedation  The patient will need discharge planning to arrange anticoagulation therapy for home.  She has some difficulty finding a  primary care physician at this time. Consult CM placed and discussed  2. Noncompliance   reinforced the importance of being compliant with the medication : 3. Obesity- needs lifestyle changes once medically stable   4. Anxiety continue home medications Celexa, Klonopin and Seroquel Outpatient psychiatric follow-up is recommended and prescriptions refilled    DISCHARGE CONDITIONS:  fair  CONSULTS OBTAINED:  Treatment Team:  Rosey Bath, MD   PROCEDURES none   DRUG ALLERGIES:   Allergies  Allergen Reactions  . Ambien [Zolpidem Tartrate] Other (See Comments)    death  . Ambien [Zolpidem Tartrate]   . Ritalin [Methylphenidate Hcl] Other (See Comments)    death  . Ritalin [Methylphenidate Hcl]     DISCHARGE MEDICATIONS:   Current Discharge Medication List    START taking these medications   Details  acetaminophen (TYLENOL) 325 MG tablet Take 2 tablets (650 mg total) by mouth every 6 (six) hours as needed for mild pain (or Fever >/= 101).    apixaban (ELIQUIS) 5 MG TABS tablet Take 2 tablets (10 mg total) by mouth 2 (two) times daily. Qty: 70 tablet, Refills: 0    docusate sodium (COLACE) 100 MG capsule Take 1 capsule (100 mg total) by mouth 2 (two) times daily as needed for mild constipation. Qty: 10 capsule, Refills: 0    oxyCODONE (OXY IR/ROXICODONE) 5 MG immediate release tablet Take 1 tablet (5 mg total) by mouth every 6 (six) hours as needed for moderate pain or breakthrough pain. Qty: 20 tablet, Refills: 0      CONTINUE these medications  which have CHANGED   Details  citalopram (CELEXA) 40 MG tablet Take 1 tablet (40 mg total) by mouth daily. Qty: 30 tablet, Refills: 0    clonazePAM (KLONOPIN) 1 MG tablet Take 1 tablet (1 mg total) by mouth 3 (three) times daily. Qty: 15 tablet, Refills: 0    QUEtiapine (SEROQUEL) 25 MG tablet Take 1 tablet (25 mg total) by mouth 2 (two) times daily. Qty: 30 tablet, Refills: 0      STOP taking these medications      dicyclomine (BENTYL) 20 MG tablet      promethazine (PHENERGAN) 25 MG tablet          DISCHARGE INSTRUCTIONS:   Activity as tolerated no strenuous exercises Follow-up with primary care physician or [facility] in a week Follow-up with psychiatry in a week Follow-up with oncology Dr. Merlene Pullingorcoran in 2 weeks  DIET:  Low fat, Low cholesterol diet  DISCHARGE CONDITION:  Stable  ACTIVITY:  Activity as tolerated  OXYGEN:  Home Oxygen: No.   Oxygen Delivery: room air  DISCHARGE LOCATION:  home   If you experience worsening of your admission symptoms, develop shortness of breath, life threatening emergency, suicidal or homicidal thoughts you must seek medical attention immediately by calling 911 or calling your MD immediately  if symptoms less severe.  You Must read complete instructions/literature along with all the possible adverse reactions/side effects for all the Medicines you take and that have been prescribed to you. Take any new Medicines after you have completely understood and accpet all the possible adverse reactions/side effects.   Please note  You were cared for by a hospitalist during your hospital stay. If you have any questions about your discharge medications or the care you received while you were in the hospital after you are discharged, you can call the unit and asked to speak with the hospitalist on call if the hospitalist that took care of you is not available. Once you are discharged, your primary care physician will handle any further medical issues. Please note that NO REFILLS for any discharge medications will be authorized once you are discharged, as it is imperative that you return to your primary care physician (or establish a relationship with a primary care physician if you do not have one) for your aftercare needs so that they can reassess your need for medications and monitor your lab values.     Today  Chief Complaint  Patient  presents with  . Leg Pain  . Leg Swelling   Patient is doing fine. Pain in the leg well controlled with oxycodone. She has good family support and her kids were taken care of by the family members at this time while she is in the hospital  ROS:  CONSTITUTIONAL: Denies fevers, chills. Denies any fatigue, weakness.  EYES: Denies blurry vision, double vision, eye pain. EARS, NOSE, THROAT: Denies tinnitus, ear pain, hearing loss. RESPIRATORY: Denies cough, wheeze, shortness of breath.  CARDIOVASCULAR: Denies chest pain, palpitations, edema.  GASTROINTESTINAL: Denies nausea, vomiting, diarrhea, abdominal pain. Denies bright red blood per rectum. GENITOURINARY: Denies dysuria, hematuria. ENDOCRINE: Denies nocturia or thyroid problems. HEMATOLOGIC AND LYMPHATIC: Denies easy bruising or bleeding. SKIN: Denies rash or lesion. MUSCULOSKELETAL: Left leg pain improved Denies pain in neck, back, shoulder, knees, hips or arthritic symptoms.  NEUROLOGIC: Denies paralysis, paresthesias.  PSYCHIATRIC: Denies anxiety or depressive symptoms.   VITAL SIGNS:  Blood pressure (!) 91/38, pulse 77, temperature 98.5 F (36.9 C), temperature source Oral, resp. rate 18,  height 5\' 5"  (1.651 m), weight 104.5 kg (230 lb 6.4 oz), last menstrual period 01/07/2016, SpO2 92 %.  I/O:    Intake/Output Summary (Last 24 hours) at 02/03/16 1205 Last data filed at 02/03/16 0800  Gross per 24 hour  Intake              483 ml  Output                0 ml  Net              483 ml    PHYSICAL EXAMINATION:  GENERAL:  30 y.o.-year-old patient lying in the bed with no acute distress.  EYES: Pupils equal, round, reactive to light and accommodation. No scleral icterus. Extraocular muscles intact.  HEENT: Head atraumatic, normocephalic. Oropharynx and nasopharynx clear.  NECK:  Supple, no jugular venous distention. No thyroid enlargement, no tenderness.  LUNGS: Normal breath sounds bilaterally, no wheezing, rales,rhonchi or  crepitation. No use of accessory muscles of respiration.  CARDIOVASCULAR: S1, S2 normal. No murmurs, rubs, or gallops.  ABDOMEN: Soft, non-tender, non-distended. Bowel sounds present. No organomegaly or mass.  EXTREMITIES: Left calf tenderness, tattoos are present No pedal edema, cyanosis, or clubbing.  NEUROLOGIC: Cranial nerves II through XII are intact. Muscle strength 5/5 in all extremities. Sensation intact. Gait not checked.  PSYCHIATRIC: The patient is alert and oriented x 3.  SKIN: No obvious rash, lesion, or ulcer.   DATA REVIEW:   CBC  Recent Labs Lab 02/03/16 0351  WBC 8.0  HGB 12.5  HCT 37.0  PLT 233    Chemistries   Recent Labs Lab 02/02/16 0321  NA 138  K 3.7  CL 108  CO2 24  GLUCOSE 103*  BUN 14  CREATININE 0.67  CALCIUM 8.8*  AST 20  ALT 20  ALKPHOS 77  BILITOT 0.5    Cardiac Enzymes No results for input(s): TROPONINI in the last 168 hours.  Microbiology Results  Results for orders placed or performed in visit on 10/01/13  Urine culture     Status: None   Collection Time: 10/01/13 12:05 PM  Result Value Ref Range Status   Micro Text Report   Final       SOURCE: CLEAN CATCH    COMMENT                   MIXED BACTERIAL ORGANISMS   COMMENT                   RESULTS SUGGESTIVE OF CONTAMINATION   ANTIBIOTIC                                                      Culture, blood (single)     Status: None   Collection Time: 10/01/13  1:11 PM  Result Value Ref Range Status   Micro Text Report   Final       COMMENT                   NO GROWTH AEROBICALLY/ANAEROBICALLY IN 5 DAYS   ANTIBIOTIC  Culture, blood (single)     Status: None   Collection Time: 10/01/13  1:11 PM  Result Value Ref Range Status   Micro Text Report   Final       COMMENT                   NO GROWTH AEROBICALLY/ANAEROBICALLY IN 5 DAYS   ANTIBIOTIC                                                        RADIOLOGY:   Ct Angio Chest Pe W And/or Wo Contrast  Result Date: 02/02/2016 CLINICAL DATA:  Left leg pain and swelling. Previous history of DVT and pulmonary embolus. Chest pain and dyspnea. EXAM: CT ANGIOGRAPHY CHEST WITH CONTRAST TECHNIQUE: Multidetector CT imaging of the chest was performed using the standard protocol during bolus administration of intravenous contrast. Multiplanar CT image reconstructions and MIPs were obtained to evaluate the vascular anatomy. CONTRAST:  75 mL Isovue 370 COMPARISON:  07/20/2015 FINDINGS: Cardiovascular: Satisfactory opacification of the pulmonary arteries to the segmental level. No evidence of pulmonary embolism. Normal heart size. No pericardial effusion. Mediastinum/Nodes: Lungs/Pleura: Lungs are clear. No pleural effusion or pneumothorax. Upper Abdomen: No acute abnormality. Musculoskeletal: No chest wall abnormality. No acute or significant osseous findings. Review of the MIP images confirms the above findings. IMPRESSION: No evidence of significant pulmonary embolus. No evidence of active pulmonary disease. Electronically Signed   By: Burman Nieves M.D.   On: 02/02/2016 05:10   US Venous Img Lower Unilateral Left  Result Date: 02/02/2016 CLINICAL DATA:  Left lower leg pain and swelling for 2 days. Shortness of breath. History of tobacco use. Previous DVT and pulmonary embolus. EXAM: Left LOWER EXTREMITY VENOUS DOPPLER ULTRASOUND TECHNIQUE: Gray-scale sonography with graded compression, as well as color Doppler and duplex ultrasound were performed to evaluate the lower extremity deep venous systems from the level of the common femoral vein and including the common femoral, femoral, profunda femoral, popliteal and calf veins including the posterior tibial, peroneal and gastrocnemius veins when visible. The superficial great saphenous vein was also interrogated. Spectral Doppler was utilized to evaluate flow at rest and with distal augmentation maneuvers in the common  femoral, femoral and popliteal veins. COMPARISON:  06/21/2015 FINDINGS: Contralateral Common Femoral Vein: Respiratory phasicity is normal and symmetric with the symptomatic side. No evidence of thrombus. Normal compressibility. Common Femoral Vein: Nonocclusive thrombus present in the distal common femoral vein with echogenic thrombus and incomplete compression. Saphenofemoral Junction: Focal thrombus demonstrated at the junction of the greater saphenous vein and common femoral vein. Profunda Femoral Vein: Nonocclusive echogenic thrombus is present with incomplete compression. Femoral Vein: Nonocclusive echogenic thrombus is present with incomplete compression and limited color flow. Popliteal Vein: Nonocclusive echogenic thrombus is present with incomplete compression and limited color flow. Calf Veins: Calf veins were not visualized. Superficial Great Saphenous Vein: Diffuse superficial venous thrombus demonstrated in the saphenous vein from the knee through to the mid calf region. Venous Reflux:  None. Other Findings:  None. IMPRESSION: Nonocclusive deep venous thrombus demonstrated from the distal common femoral vein through the popliteal vein. Profunda femoral vein and superficial greater saphenous veins also demonstrate thrombus. These results were called by telephone at the time of interpretation on 02/02/2016 at 5:04 am to Dr. Bayard Males , who verbally acknowledged  these results. Electronically Signed   By: Burman NievesWilliam  Stevens M.D.   On: 02/02/2016 05:06    EKG:   Orders placed or performed in visit on 07/20/15  . EKG 12-Lead      Management plans discussed with the patient, family and they are in agreement.  CODE STATUS:     Code Status Orders        Start     Ordered   02/02/16 0810  Full code  Continuous     02/02/16 0809    Code Status History    Date Active Date Inactive Code Status Order ID Comments User Context   This patient has a current code status but no historical code  status.      TOTAL TIME TAKING CARE OF THIS PATIENT: 45 minutes.   Note: This dictation was prepared with Dragon dictation along with smaller phrase technology. Any transcriptional errors that result from this process are unintentional.   @[facility] @  on 02/03/2016 at 12:05 PM  Between 7am to 6pm - Pager - [phone]  After 6pm go to [email or web address] - password [email or web address] [facility]  [facility]  Office  [phone]  CC: Primary care physician; No PCP Per Patient

## 2016-02-03 NOTE — Progress Notes (Signed)
  ANTICOAGULATION CONSULT NOTE - Initial Consult  Pharmacy Consult for Apixaban Indication: DVT  Allergies  Allergen Reactions  . Ambien [Zolpidem Tartrate] Other (See Comments)    death  . Ambien [Zolpidem Tartrate]   . Ritalin [Methylphenidate Hcl] Other (See Comments)    death  . Ritalin [Methylphenidate Hcl]     Patient Measurements: Height: 5\' 5"  (165.1 cm) Weight: 230 lb 6.4 oz (104.5 kg) IBW/kg (Calculated) : 57  Vital Signs: Temp: 98.5 F (36.9 C) (11/12 0410) Temp Source: Oral (11/12 0410) BP: 91/38 (11/12 0736) Pulse Rate: 77 (11/12 0736)  Labs:  Recent Labs  02/02/16 0321 02/03/16 0351  HGB 12.9 12.5  HCT 36.9 37.0  PLT 245 233  CREATININE 0.67  --     Estimated Creatinine Clearance: 124.5 mL/min (by C-G formula based on SCr of 0.67 mg/dL).   Medical History: Past Medical History:  Diagnosis Date  . Asthma   . Carpal tunnel syndrome   . DVT of lower extremity (deep venous thrombosis) (HCC)   . Factor 5 Leiden mutation, heterozygous (HCC)   . Migraine   . Seizures [facility](HCC)     Assessment: 30 y/o F with DVT on full-dose Lovenox to transition to apixaban.   Plan:  Apixaban 10 mg bid x 7 days then 5 mg bid beginning 12 hours after last Lovenox dose.   Brianna Ashley, Brianna Ashley D 02/03/2016,11:46 AM

## 2016-02-03 NOTE — Consult Note (Signed)
[facility]  Date of admission:  02/02/2016  Inpatient day:  02/03/2016  Consulting physician: Dr. 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 female with Factor V Leiden and a history of DVT (2008) who was admitted with a left lower extremity DVT.  HPI:  The patient was diagnosed with Factor V Leiden at age 12.  She was tested as her mother had numerous clots and had Factor V Leiden.  The patient developed her first clot in 2008, a few days after the deliver of her first child.  She was anticoagulated with Lovenox.  She continued Lovenox for "awhile".  She was on Lovenox the following year when she gave birth to twins and the next year when she had her 4th child.  She states that sometime after that, she stopped anticoagulation.  She notes a 2-3 day history of left lower extremity pain and swelling.  She denies any recent surgery, bed rest, or use of birth control pills.   Left lower extremity duplex revealed non-occlusive deep venous thrombus from the distal common femoral vein through the popliteal vein. Profunda femoral vein and superficial greater saphenous veins also demonstrated thrombus.  Chest CT angiogram revealed no evidence of pulmonary embolism.  She was started on Lovenox.  She denies any other systemic symptoms.  She smokes.  Her mother and sister were diagnosed with Factor V Leiden.  Both have had clots.  Her mother is deceased.   Past Medical History:  Diagnosis Date  . Asthma   . Carpal tunnel syndrome   . DVT of lower extremity (deep venous thrombosis) ([place])   . Factor 5 Leiden mutation, heterozygous ([place])   . Migraine   . Seizures ([place])     Past Surgical History:  Procedure Laterality Date  . ABSCESS DRAINAGE    . CESAREAN SECTION    . DILATION AND CURETTAGE OF UTERUS      Family History  Problem Relation Age of Onset  . Factor V Leiden deficiency Mother      deceased of drug overdose  Her father was killed by a gun shot wound when she was 30 years old.   Social History:  reports that she has been smoking Cigarettes.  She has a 20.00 pack-year smoking history. She has never used smokeless tobacco. She reports that she does not drink alcohol or use drugs.  She started smoking at age 60-9.  She smokes 1 - 1 1/2 packs a day.  She has 4 children (71 year old daughter, twins age 61, and 28 year old).  All children are cared for by her family members (grandparents or great aunt).  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  The patient lives alone in [place].  She is alone today.  Allergies:  Allergies  Allergen Reactions  . Ambien [Zolpidem Tartrate] Other (See Comments)    death  . Ambien [Zolpidem Tartrate]   . Ritalin [Methylphenidate Hcl] Other (See Comments)    death  . Ritalin [Methylphenidate Hcl]     No prescriptions prior to admission.    Review of Systems: GENERAL:  Feels "ok".  No fevers or sweats.  Weight up and down. PERFORMANCE STATUS (ECOG):  1 HEENT:  No visual changes, runny nose, sore throat, mouth sores or tenderness. Lungs:  Little cough.  No shortness of breath.  No hemoptysis. Cardiac:  No chest pain, palpitations, orthopnea, or PND.  GI:  No nausea, vomiting, diarrhea, constipation, melena or hematochezia. GU:  Frequent urination.  No urgency, dysuria, or hematuria. Musculoskeletal:  No back pain.  No joint pain.  No muscle tenderness. Extremities:  Pain and swelling left leg x 2-3 days. Skin:  No rashes or skin changes. Neuro:  Migraine headaches.  No numbness or weakness, balance or coordination issues. Endocrine:  No diabetes, thyroid issues, hot flashes or night sweats. Psych:  No mood changes, depression or anxiety. Pain:  No focal pain. Review of systems:  All other systems reviewed and found to be negative.  Physical Exam:  Blood pressure (!) 91/38, pulse 77, temperature 98.5 F (36.9 C), temperature  source Oral, resp. rate 18, height 5' 5"  (1.651 m), weight 230 lb 6.4 oz (104.5 kg), last menstrual period 01/07/2016, SpO2 92 %.  GENERAL:  Well developed, well nourished, heavyset woman sitting comfortably on the medical unit in no acute distress.  She nods off to sleep frequently. MENTAL STATUS:  Alert and oriented to person, place and time. HEAD:  Brown hair with red rinse.  Normocephalic, atraumatic, face symmetric, no Cushingoid features. EYES:  Brown eyes.  Pupils equal round and reactive to light and accomodation.  No conjunctivitis or scleral icterus. ENT:  Oropharynx clear without lesion.  Tongue pierced. Mucous membranes moist.  RESPIRATORY:  Clear to auscultation without rales, wheezes or rhonchi. CARDIOVASCULAR:  Regular rate and rhythm without murmur, rub or gallop. ABDOMEN:  Soft, non-tender, with active bowel sounds, and no hepatosplenomegaly.  No masses. SKIN:  Multiple tattoos.  No rashes, ulcers or lesions. EXTREMITIES: left lower extremity edema with pain on palpation.  Slight erythema medially on distal left lower extremity.  LYMPH NODES: No palpable cervical, supraclavicular, axillary or inguinal adenopathy  NEUROLOGICAL: Unremarkable. PSYCH:  Sleepy.  Frequently dozes off to sleep.   Results for orders placed or performed during the hospital encounter of 02/02/16 (from the past 48 hour(s))  CBC     Status: None   Collection Time: 02/02/16  3:21 AM  Result Value Ref Range   WBC 11.0 3.6 - 11.0 K/uL   RBC 4.06 3.80 - 5.20 MIL/uL   Hemoglobin 12.9 12.0 - 16.0 g/dL   HCT 36.9 35.0 - 47.0 %   MCV 90.8 80.0 - 100.0 fL   MCH 31.9 26.0 - 34.0 pg   MCHC 35.1 32.0 - 36.0 g/dL   RDW 14.3 11.5 - 14.5 %   Platelets 245 150 - 440 K/uL  Comprehensive metabolic panel     Status: Abnormal   Collection Time: 02/02/16  3:21 AM  Result Value Ref Range   Sodium 138 135 - 145 mmol/L   Potassium 3.7 3.5 - 5.1 mmol/L   Chloride 108 101 - 111 mmol/L   CO2 24 22 - 32 mmol/L    Glucose, Bld 103 (H) 65 - 99 mg/dL   BUN 14 6 - 20 mg/dL   Creatinine, Ser 0.67 0.44 - 1.00 mg/dL   Calcium 8.8 (L) 8.9 - 10.3 mg/dL   Total Protein 6.7 6.5 - 8.1 g/dL   Albumin 3.9 3.5 - 5.0 g/dL   AST 20 15 - 41 U/L   ALT 20 14 - 54 U/L   Alkaline Phosphatase 77 38 - 126 U/L   Total Bilirubin 0.5 0.3 - 1.2 mg/dL   GFR calc non Af Amer >60 >60 mL/min   GFR calc Af Amer >60 >60 mL/min    Comment: (NOTE) The eGFR has been calculated using the CKD EPI  equation. This calculation has not been validated in all clinical situations. eGFR's persistently <60 mL/min signify possible Chronic Kidney Disease.    Anion gap 6 5 - 15  TSH     Status: Abnormal   Collection Time: 02/02/16  3:21 AM  Result Value Ref Range   TSH 5.396 (H) 0.350 - 4.500 uIU/mL    Comment: Performed by a 3rd Generation assay with a functional sensitivity of <=0.01 uIU/mL.  Hemoglobin A1c     Status: None   Collection Time: 02/02/16  3:21 AM  Result Value Ref Range   Hgb A1c MFr Bld 5.5 4.8 - 5.6 %    Comment: (NOTE)         Pre-diabetes: 5.7 - 6.4         Diabetes: >6.4         Glycemic control for adults with diabetes: <7.0    Mean Plasma Glucose 111 mg/dL    Comment: (NOTE) Performed At: [facility] [place], [place] [id number] Lindon Romp MD YY:[id number]   Pregnancy, urine POC     Status: None   Collection Time: 02/02/16  3:29 AM  Result Value Ref Range   Preg Test, Ur NEGATIVE NEGATIVE    Comment:        THE SENSITIVITY OF THIS METHODOLOGY IS >24 mIU/mL   CBC     Status: Abnormal   Collection Time: 02/03/16  3:51 AM  Result Value Ref Range   WBC 8.0 3.6 - 11.0 K/uL   RBC 4.01 3.80 - 5.20 MIL/uL   Hemoglobin 12.5 12.0 - 16.0 g/dL   HCT 37.0 35.0 - 47.0 %   MCV 92.3 80.0 - 100.0 fL   MCH 31.2 26.0 - 34.0 pg   MCHC 33.7 32.0 - 36.0 g/dL   RDW 14.7 (H) 11.5 - 14.5 %   Platelets 233 150 - 440 K/uL   Ct Angio Chest Pe W And/or Wo Contrast  Result Date:  02/02/2016 CLINICAL DATA:  Left leg pain and swelling. Previous history of DVT and pulmonary embolus. Chest pain and dyspnea. EXAM: CT ANGIOGRAPHY CHEST WITH CONTRAST TECHNIQUE: Multidetector CT imaging of the chest was performed using the standard protocol during bolus administration of intravenous contrast. Multiplanar CT image reconstructions and MIPs were obtained to evaluate the vascular anatomy. CONTRAST:  75 mL Isovue 370 COMPARISON:  07/20/2015 FINDINGS: Cardiovascular: Satisfactory opacification of the pulmonary arteries to the segmental level. No evidence of pulmonary embolism. Normal heart size. No pericardial effusion. Mediastinum/Nodes: Lungs/Pleura: Lungs are clear. No pleural effusion or pneumothorax. Upper Abdomen: No acute abnormality. Musculoskeletal: No chest wall abnormality. No acute or significant osseous findings. Review of the MIP images confirms the above findings. IMPRESSION: No evidence of significant pulmonary embolus. No evidence of active pulmonary disease. Electronically Signed   By: Lucienne Capers M.D.   On: 02/02/2016 05:10   US Venous Img Lower Unilateral Left  Result Date: 02/02/2016 CLINICAL DATA:  Left lower leg pain and swelling for 2 days. Shortness of breath. History of tobacco use. Previous DVT and pulmonary embolus. EXAM: Left LOWER EXTREMITY VENOUS DOPPLER ULTRASOUND TECHNIQUE: Gray-scale sonography with graded compression, as well as color Doppler and duplex ultrasound were performed to evaluate the lower extremity deep venous systems from the level of the common femoral vein and including the common femoral, femoral, profunda femoral, popliteal and calf veins including the posterior tibial, peroneal and gastrocnemius veins when visible. The superficial great saphenous vein was also interrogated. Spectral Doppler was utilized  to evaluate flow at rest and with distal augmentation maneuvers in the common femoral, femoral and popliteal veins. COMPARISON:  06/21/2015  FINDINGS: Contralateral Common Femoral Vein: Respiratory phasicity is normal and symmetric with the symptomatic side. No evidence of thrombus. Normal compressibility. Common Femoral Vein: Nonocclusive thrombus present in the distal common femoral vein with echogenic thrombus and incomplete compression. Saphenofemoral Junction: Focal thrombus demonstrated at the junction of the greater saphenous vein and common femoral vein. Profunda Femoral Vein: Nonocclusive echogenic thrombus is present with incomplete compression. Femoral Vein: Nonocclusive echogenic thrombus is present with incomplete compression and limited color flow. Popliteal Vein: Nonocclusive echogenic thrombus is present with incomplete compression and limited color flow. Calf Veins: Calf veins were not visualized. Superficial Great Saphenous Vein: Diffuse superficial venous thrombus demonstrated in the saphenous vein from the knee through to the mid calf region. Venous Reflux:  None. Other Findings:  None. IMPRESSION: Nonocclusive deep venous thrombus demonstrated from the distal common femoral vein through the popliteal vein. Profunda femoral vein and superficial greater saphenous veins also demonstrate thrombus. These results were called by telephone at the time of interpretation on 02/02/2016 at 5:04 am to Dr. Marjean Donna , who verbally acknowledged these results. Electronically Signed   By: Lucienne Capers M.D.   On: 02/02/2016 05:06    Assessment:  The patient is a 30 y.o. woman with Factor V Leiden and recurrent left lower extremity thrombosis.  Patient developed her first thrombosis in 2008 after the delivery of her first child.  There were no apparent precipitating events for her second thrombosis.  She was on anticoagulation for approximately 2 years during her subsequent pregnancies.  She is overweight and smokes.  Left lower extremity duplex on 02/02/2016 revealed non-occlusive deep venous thrombus from the distal common femoral vein  through the popliteal vein. Profunda femoral vein and superficial greater saphenous veins also demonstrated thrombus.  Chest CT angiogram on 02/02/2016 revealed no evidence of tpulmonary embolism.  She is currently on Lovenox.  Her sister and mother have [employer] and have had thrombosis.  Symptomatically, she only notes pain and swelling in her left lower extremity.  Plan:   1.  Discuss plan for life long anticoagulation.  Discuss long term treatment with Lovenox, Coumadin, Eliquis, or Xarelto.  For ease of administration, discuss consideration of Eliquis or Xarelto.  Compliance critical.  Sociasl work to assist with obtaining coverage. 2.  Discuss smoking cessation. 3.  Labs today:  prothrombin gene mutation, lupus anticoagulant panel, anticardiolipin antibodies, beta2-glycoprotein. 4.  Anticipate follow-up in the hematology clinic.  Thank you for allowing me to participate in Brianna Ashley 's care.  I will follow her closely with you while hospitalized and after discharge in the outpatient department.  Lequita Asal, MD  02/03/2016, 11:39 AM

## 2016-02-03 NOTE — Care Management Note (Signed)
Case Management Note  Patient Details  Name: Brianna Ashley MRN: [id number] Date of Birth: March 11, 1986  Subjective/Objective:   Provided [name] with a discount coupon for Eliquis.                  Action/Plan:   Expected Discharge Date:  02/04/16               Expected Discharge Plan:     In-House Referral:     Discharge planning Services     Post Acute Care Choice:    Choice offered to:     DME Arranged:    DME Agency:     HH Arranged:    HH Agency:     Status of Service:     If discussed at [employer] Meetings, dates discussed:    Additional Comments:  Erron Wengert A, RN 02/03/2016, 11:59 AM

## 2016-02-03 NOTE — Plan of Care (Signed)
Problem: Safety: Goal: Ability to remain free from injury will improve Outcome: Progressing Pt able to call for assistance when needed.  Problem: Pain Managment: Goal: General experience of comfort will improve Outcome: Progressing Still requiring oxycodone for pain.  Problem: Fluid Volume: Goal: Ability to maintain a balanced intake and output will improve Outcome: Progressing Eating and drinking without difficulty.  Problem: Bowel/Gastric: Goal: Will not experience complications related to bowel motility Outcome: Completed/Met Date Met: 02/03/16 No complaints of constipation.

## 2016-02-05 LAB — PTT-LA MIX: PTT-LA Mix: 48.8 s (ref 0.0–48.9)

## 2016-02-05 LAB — HEXAGONAL PHASE PHOSPHOLIPID: Hexagonal Phase Phospholipid: 13 s — ABNORMAL HIGH (ref 0–11)

## 2016-02-05 LAB — LUPUS ANTICOAGULANT PANEL
DRVVT: 38.3 s (ref 0.0–47.0)
PTT Lupus Anticoagulant: 56.3 s — ABNORMAL HIGH (ref 0.0–51.9)

## 2016-02-05 LAB — PTT-LA INCUB MIX: PTT-LA Incub Mix: 52.3 s — ABNORMAL HIGH (ref 0.0–48.9)

## 2016-02-06 LAB — BETA-2-GLYCOPROTEIN I ABS, IGG/M/A
Beta-2 Glyco I IgG: <9 GPI IgG units (ref 0–20)
Beta-2-Glycoprotein I IgA: <9 GPI IgA units (ref 0–25)
Beta-2-Glycoprotein I IgM: <9 GPI IgM units (ref 0–32)

## 2016-02-06 LAB — CARDIOLIPIN ANTIBODIES, IGG, IGM, IGA
Anticardiolipin IgA: <9 APL U/mL (ref 0–11)
Anticardiolipin IgG: <9 GPL U/mL (ref 0–14)
Anticardiolipin IgM: <9 MPL U/mL (ref 0–12)

## 2016-02-12 LAB — PROTHROMBIN GENE MUTATION

## 2016-03-28 ENCOUNTER — Inpatient Hospital Stay: Payer: Medicaid Other | Admitting: Hematology and Oncology

## 2016-03-28 DIAGNOSIS — I82412 Acute embolism and thrombosis of left femoral vein: Secondary | ICD-10-CM | POA: Insufficient documentation

## 2016-03-28 DIAGNOSIS — R760 Raised antibody titer: Secondary | ICD-10-CM | POA: Insufficient documentation

## 2016-03-28 DIAGNOSIS — D6851 Activated protein C resistance: Secondary | ICD-10-CM | POA: Insufficient documentation

## 2016-03-28 NOTE — Progress Notes (Deleted)
[facility] day:  03/28/2016  Chief Complaint: [facility] is a 31 y.o. 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  HPI:  The patient was diagnosed with Factor V Leiden at age 31.  She was tested as her mother had numerous clots and had Factor V Leiden.  The patient developed her first clot in 2008, a few days after the deliver of her first child.  She was anticoagulated with Lovenox.  She continued Lovenox for "awhile".  She was on Lovenox the following year when she gave birth to twins and the next year when she had her 4th child.  She states that sometime after that, she stopped anticoagulation.  She was admitted to [facility] from 02/02/2016 - 02/03/2016.  She presented with a 2-3 day history of left lower extremity pain and swelling.  She denies any recent surgery, bed rest, or use of birth control pills.   Left lower extremity duplex revealed non-occlusive deep venous thrombus from the distal common femoral vein through the popliteal vein. Profunda femoral vein and superficial greater saphenous veins also demonstrated thrombus.  Chest CT angiogram revealed no evidence of pulmonary embolism.  She was seen in consultation.  We discussed life long anticoagulation.  She was started on Lovenox.  She was discharged on Eliquis.  We discussed smoking cessation.  A limited hypercoagulable work-up was performed.  The following studies were negative: prothrombin gene mutation, anticardiolipin antibodies, and beta-2 glycoprotein antibodies.  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  Her mother and sister were diagnosed with Factor V Leiden.  Both have had clots.  Her mother is deceased.   Past Medical History:  Diagnosis Date  . Asthma   . Carpal tunnel syndrome   . DVT of lower extremity (deep venous thrombosis) ([facility])   . Factor 5 Leiden mutation, heterozygous ([facility])   .  Migraine   . Seizures ([facility])     Past Surgical History:  Procedure Laterality Date  . ABSCESS DRAINAGE    . CESAREAN SECTION    . DILATION AND CURETTAGE OF UTERUS      Family History  Problem Relation Age of Onset  . Factor V Leiden deficiency Mother     deceased of drug overdose  Her father was killed by a gun shot wound when she was 8 years old.  Her mother and sister have a history of thrombosis and have factor V Leiden.   Social History:  reports that she has been smoking Cigarettes.  She has a 20.00 pack-year smoking history. She has never used smokeless tobacco. She reports that she does not drink alcohol or use drugs.  She started smoking at age [age].  She smokes 1 - 1 1/2 packs a day.  She has 4 children (61 year old daughter, twins age 10, and 68 year old).  All children are cared for by her family members (grandparents or great aunt).  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  The patient lives alone in [place].  She is accompanied by *** alone today.  Allergies:  Allergies  Allergen Reactions  . Ambien [Zolpidem Tartrate] Other (See Comments)    death  . Ambien [Zolpidem Tartrate]   . Ritalin [Methylphenidate Hcl] Other (See Comments)    death  . Ritalin [Methylphenidate Hcl]     Current Medications: Current Outpatient Prescriptions  Medication Sig Dispense Refill  . acetaminophen (TYLENOL) 325 MG tablet Take  2 tablets (650 mg total) by mouth every 6 (six) hours as needed for mild pain (or Fever >/= 101).    Marland Kitchen apixaban (ELIQUIS) 5 MG TABS tablet Take 2 tablets (10 mg total) by mouth 2 (two) times daily. 70 tablet 0  . citalopram (CELEXA) 40 MG tablet Take 1 tablet (40 mg total) by mouth daily. 30 tablet 0  . clonazePAM (KLONOPIN) 1 MG tablet Take 1 tablet (1 mg total) by mouth 3 (three) times daily. 15 tablet 0  . docusate sodium (COLACE) 100 MG capsule Take 1 capsule (100 mg total) by mouth 2 (two) times daily as needed for mild constipation. 10 capsule 0  .  oxyCODONE (OXY IR/ROXICODONE) 5 MG immediate release tablet Take 1 tablet (5 mg total) by mouth every 6 (six) hours as needed for moderate pain or breakthrough pain. 20 tablet 0  . QUEtiapine (SEROQUEL) 25 MG tablet Take 1 tablet (25 mg total) by mouth 2 (two) times daily. 30 tablet 0   No current facility-administered medications for this visit.     Review of Systems:  GENERAL:  Feels "ok".  No fevers or sweats.  Weight up and down. PERFORMANCE STATUS (ECOG):  1 HEENT:  No visual changes, runny nose, sore throat, mouth sores or tenderness. Lungs:  Little cough.  No shortness of breath.  No hemoptysis. Cardiac:  No chest pain, palpitations, orthopnea, or PND. GI:  No nausea, vomiting, diarrhea, constipation, melena or hematochezia. GU:  Frequent urination.  No urgency, dysuria, or hematuria. Musculoskeletal:  No back pain.  No joint pain.  No muscle tenderness. Extremities:  Pain and swelling left leg x 2-3 days. Skin:  No rashes or skin changes. Neuro:  Migraine headaches.  No numbness or weakness, balance or coordination issues. Endocrine:  No diabetes, thyroid issues, hot flashes or night sweats. Psych:  No mood changes, depression or anxiety. Pain:  No focal pain. Review of systems:  All other systems reviewed and found to be negative  Physical Exam: There were no vitals taken for this visit. GENERAL:  Well developed, well nourished, heavyset woman sitting comfortably on the medical unit in no acute distress.  She nods off to sleep frequently. MENTAL STATUS:  Alert and oriented to person, place and time. HEAD:  Brown hair with red rinse.  Normocephalic, atraumatic, face symmetric, no Cushingoid features. EYES:  Brown eyes.  Pupils equal round and reactive to light and accomodation.  No conjunctivitis or scleral icterus. ENT:  Oropharynx clear without lesion.  Tongue pierced. Mucous membranes moist.  RESPIRATORY:  Clear to auscultation without rales, wheezes or  rhonchi. CARDIOVASCULAR:  Regular rate and rhythm without murmur, rub or gallop. ABDOMEN:  Soft, non-tender, with active bowel sounds, and no hepatosplenomegaly.  No masses. SKIN:  Multiple tattoos.  No rashes, ulcers or lesions. EXTREMITIES: left lower extremity edema with pain on palpation.  Slight erythema medially on distal left lower extremity.  LYMPH NODES: No palpable cervical, supraclavicular, axillary or inguinal adenopathy  NEUROLOGICAL: Unremarkable. PSYCH:  Sleepy.  Frequently dozes off to sleep   No visits with results within 3 Day(s) from this visit.  Latest known visit with results is:  Admission on 02/02/2016, Discharged on 02/03/2016  Component Date Value Ref Range Status  . WBC 02/02/2016 11.0  3.6 - 11.0 K/uL Final  . RBC 02/02/2016 4.06  3.80 - 5.20 MIL/uL Final  . Hemoglobin 02/02/2016 12.9  12.0 - 16.0 g/dL Final  . HCT 02/02/2016 36.9  35.0 - 47.0 % Final  .  MCV 02/02/2016 90.8  80.0 - 100.0 fL Final  . MCH 02/02/2016 31.9  26.0 - 34.0 pg Final  . MCHC 02/02/2016 35.1  32.0 - 36.0 g/dL Final  . RDW 02/02/2016 14.3  11.5 - 14.5 % Final  . Platelets 02/02/2016 245  150 - 440 K/uL Final  . Sodium 02/02/2016 138  135 - 145 mmol/L Final  . Potassium 02/02/2016 3.7  3.5 - 5.1 mmol/L Final  . Chloride 02/02/2016 108  101 - 111 mmol/L Final  . CO2 02/02/2016 24  22 - 32 mmol/L Final  . Glucose, Bld 02/02/2016 103* 65 - 99 mg/dL Final  . BUN 02/02/2016 14  6 - 20 mg/dL Final  . Creatinine, Ser 02/02/2016 0.67  0.44 - 1.00 mg/dL Final  . Calcium 02/02/2016 8.8* 8.9 - 10.3 mg/dL Final  . Total Protein 02/02/2016 6.7  6.5 - 8.1 g/dL Final  . Albumin 02/02/2016 3.9  3.5 - 5.0 g/dL Final  . AST 02/02/2016 20  15 - 41 U/L Final  . ALT 02/02/2016 20  14 - 54 U/L Final  . Alkaline Phosphatase 02/02/2016 77  38 - 126 U/L Final  . Total Bilirubin 02/02/2016 0.5  0.3 - 1.2 mg/dL Final  . GFR calc non Af Amer 02/02/2016 >60  >60 mL/min Final  . GFR calc Af Amer 02/02/2016 >60   >60 mL/min Final   Comment: (NOTE) The eGFR has been calculated using the CKD EPI equation. This calculation has not been validated in all clinical situations. eGFR's persistently <60 mL/min signify possible Chronic Kidney Disease.   . Anion gap 02/02/2016 6  5 - 15 Final  . Preg Test, Ur 02/02/2016 NEGATIVE  NEGATIVE Final   Comment:        THE SENSITIVITY OF THIS METHODOLOGY IS >24 mIU/mL   . TSH 02/02/2016 5.396* 0.350 - 4.500 uIU/mL Final  . Hgb A1c MFr Bld 02/03/2016 5.5  4.8 - 5.6 % Final   Comment: (NOTE)         Pre-diabetes: 5.7 - 6.4         Diabetes: >6.4         Glycemic control for adults with diabetes: <7.0   . Mean Plasma Glucose 02/03/2016 111  mg/dL Final   Comment: (NOTE) Performed At: [facility] [place], [place] [id number] Lindon Romp MD YF:[id number]   . WBC 02/03/2016 8.0  3.6 - 11.0 K/uL Final  . RBC 02/03/2016 4.01  3.80 - 5.20 MIL/uL Final  . Hemoglobin 02/03/2016 12.5  12.0 - 16.0 g/dL Final  . HCT 02/03/2016 37.0  35.0 - 47.0 % Final  . MCV 02/03/2016 92.3  80.0 - 100.0 fL Final  . MCH 02/03/2016 31.2  26.0 - 34.0 pg Final  . MCHC 02/03/2016 33.7  32.0 - 36.0 g/dL Final  . RDW 02/03/2016 14.7* 11.5 - 14.5 % Final  . Platelets 02/03/2016 233  150 - 440 K/uL Final  . Recommendations-PTGENE: 02/12/2016 Comment   Final   Comment: (NOTE) NEGATIVE No mutation identified. Comment: A point mutation ([id number]) in the factor II (prothrombin) gene is the second most common cause of inherited thrombophilia. The incidence of this mutation in the [place] Caucasian population is about 2% and in the [place] American population it is approximately 0.5%. This mutation is rare in the [place] and Native American population. Being heterozygous for a prothrombin mutation increases the risk for developing venous thrombosis about 2 to 3 times above the general population risk. Being homozygous  for the prothrombin gene mutation increases the  relative risk for venous thrombosis further, although it is not yet known how much further the risk is increased. In women heterozygous for the prothrombin gene mutation, the use of estrogen containing oral contraceptives increases the relative risk of venous thrombosis about 16 times and the risk of developing cerebral thrombosis is also significantly increased. In pregnancy the pr                          othrombin gene mutation increases risk for venous thrombosis and may increase risk for stillbirth, placental abruption, pre-eclampsia and fetal growth restriction. If the patient possesses two or more congenital or acquired thrombophilic risk factors, the risk for thrombosis may rise to more than the sum of the risk ratios for the individual mutations. This assay detects only the prothrombin G20210A mutation and does not measure genetic abnormalities elsewhere in the genome. Other thrombotic risk factors may be pursued through systematic clinical laboratory analysis. These factors include the R506Q (Leiden) mutation in the Factor V gene, plasma homocysteine levels, as well as testing for deficiencies of antithrombin III, protein C and protein S.   . Additional Information 02/12/2016 Comment   Final   Comment: (NOTE) Genetic Counselors are available for health care providers to discuss results at [phone]). Methodology: DNA analysis of the Factor II gene was performed by PCR amplification followed by restriction analysis. The diagnostic sensitivity is >99% for both. All the tests must be combined with clinical information for the most accurate interpretation. Molecular-based testing is highly accurate, but as in any laboratory test, diagnostic errors may occur. This test was developed and its performance characteristics determined by [facility]. It has not been cleared or approved by the Food and Drug Administration. Poort SR, et al. Blood. 1996; 53:7482-7078. Varga EA.  Circulation. 2004; 675:Q49-E01. Mervin Hack, et [place]; 19:700-703. Allison Quarry, PhD, [facility] Ruben Reason, PhD, [facility] Jens Som, PhD, [facility] Annetta Maw, M.S., PhD, [facility] Alfredo Bach, PhD, [facility] Norva Riffle, PhD, [facility] Earlean Polka,                           PhD, [facility] Performed At: [facility] [address] [place], [place] [id number] Nechama Guard MD OI:[id number]   . PTT Lupus Anticoagulant 02/05/2016 56.3* 0.0 - 51.9 sec Final  . DRVVT 02/05/2016 38.3  0.0 - 47.0 sec Final  . Lupus Anticoag Interp 02/05/2016 Comment:   Corrected   Comment: (NOTE) Results are consistent with the presence of a lupus anticoagulant. NOTE: Only persistent lupus anticoagulants are thought to be of clinical significance. For this reason, repeat testing in 12 or more weeks after an initial positive result should be considered to confirm or refute the presence of a lupus anticoagulant, depending on clinical presentation. Results of lupus anticoagulant tests may be falsely positive in the presence of certain anticoagulant therapies. Performed At: [facility] [place], [place] [id number] Lindon Romp MD KG:[id number]   . Anticardiolipin IgG 02/06/2016 <9  0 - 14 GPL U/mL Final   Comment: (NOTE)                          Negative:              <15  Indeterminate:     15 - 20                          Low-Med Positive: >20 - 80                          High Positive:         >80   . Anticardiolipin IgM 02/06/2016 <9  0 - 12 MPL U/mL Final   Comment: (NOTE)                          Negative:              <13                          Indeterminate:     13 - 20                          Low-Med Positive: >20 - 80                          High Positive:         >80   . Anticardiolipin IgA 02/06/2016 <9  0 - 11 APL U/mL Final   Comment: (NOTE)                          Negative:               <12                          Indeterminate:     12 - 20                          Low-Med Positive: >20 - 80                          High Positive:         >80 Performed At: [facility] [place], [place] [id number] Lindon Romp MD DH:[id number]   . Beta-2 Glyco I IgG 02/06/2016 <9  0 - 20 GPI IgG units Final   Comment: (NOTE) The reference interval reflects a 3SD or 99th percentile interval, which is thought to represent a potentially clinically significant result in accordance with the International Consensus Statement on the classification criteria for definitive antiphospholipid syndrome (APS). J Thromb Haem 2006;4:295-306.   . Beta-2-Glycoprotein I IgM 02/06/2016 <9  0 - 32 GPI IgM units Final   Comment: (NOTE) The reference interval reflects a 3SD or 99th percentile interval, which is thought to represent a potentially clinically significant result in accordance with the International Consensus Statement on the classification criteria for definitive antiphospholipid syndrome (APS). J Thromb Haem 2006;4:295-306. Performed At: [facility] [place], [place] [id number] Lindon Romp MD QM:[id number]   . Beta-2-Glycoprotein I IgA 02/06/2016 <9  0 - 25 GPI IgA units Final   Comment: (NOTE) The reference interval reflects a 3SD or 99th percentile interval, which is thought to represent a potentially clinically significant result in accordance with the International Consensus Statement on the classification criteria for definitive antiphospholipid syndrome (APS). Philip Aspen  Haem 2006;4:295-306.   Marland Kitchen PTT-LA Mix 02/05/2016 48.8  0.0 - 48.9 sec Final   Comment: (NOTE) Performed At: [facility] [place], [place] [id number] Lindon Romp MD OU:[id number]   . PTT-LA INCUB MIX 02/05/2016 52.3* 0.0 - 48.9 sec Final   Comment: (NOTE) Performed At: [facility] [place], [place]  [id number] Lindon Romp MD BO:[id number]   . Hexagonal Phase Phospholipid 02/05/2016 13* 0 - 11 sec Final   Comment: (NOTE) **Verified by repeat analysis** Performed At: [facility] [place], [place] [id number] Lindon Romp MD CQ:[id number]     Assessment:  [facility] is a 31 y.o. female with Factor V Leiden and recurrent left lower extremity thrombosis.  She developed her first thrombosis in 2008 after the delivery of her first child.  There were no apparent precipitating events for her second thrombosis.  She was on anticoagulation for approximately 2 years during her subsequent pregnancies.  She is overweight and smokes.  Left lower extremity duplex on 02/02/2016 revealed non-occlusive deep venous thrombus from the distal common femoral vein through the popliteal vein. Profunda femoral vein and superficial greater saphenous veins also demonstrated thrombus.  Chest CT angiogram on 02/02/2016 revealed no evidence of tpulmonary embolism.  She is currently on Eliquis.  She has a family history of thrombosis.  Her sister and mother have Factor V Leiden.  Symptomatically, she only notes pain and swelling in her left lower   Plan: 1. *** 2. *** 3. *** 4. *** 5. ***  Lequita Asal, MD  03/28/2016, 4:49 AM

## 2016-04-28 ENCOUNTER — Emergency Department
Admission: EM | Admit: 2016-04-28 | Discharge: 2016-04-28 | Disposition: A | Discharge status: Home / Self Care | Payer: Medicaid Other | Attending: Emergency Medicine | Admitting: Emergency Medicine

## 2016-04-28 DIAGNOSIS — S025XXA Fracture of tooth (traumatic), initial encounter for closed fracture: Secondary | ICD-10-CM | POA: Diagnosis not present

## 2016-04-28 DIAGNOSIS — F17210 Nicotine dependence, cigarettes, uncomplicated: Secondary | ICD-10-CM | POA: Diagnosis not present

## 2016-04-28 DIAGNOSIS — Z79899 Other long term (current) drug therapy: Secondary | ICD-10-CM | POA: Diagnosis not present

## 2016-04-28 DIAGNOSIS — X58XXXA Exposure to other specified factors, initial encounter: Secondary | ICD-10-CM | POA: Insufficient documentation

## 2016-04-28 DIAGNOSIS — Y939 Activity, unspecified: Secondary | ICD-10-CM | POA: Diagnosis not present

## 2016-04-28 DIAGNOSIS — J45909 Unspecified asthma, uncomplicated: Secondary | ICD-10-CM | POA: Diagnosis not present

## 2016-04-28 DIAGNOSIS — Y999 Unspecified external cause status: Secondary | ICD-10-CM | POA: Insufficient documentation

## 2016-04-28 DIAGNOSIS — Y929 Unspecified place or not applicable: Secondary | ICD-10-CM | POA: Diagnosis not present

## 2016-04-28 DIAGNOSIS — S0993XA Unspecified injury of face, initial encounter: Secondary | ICD-10-CM | POA: Diagnosis present

## 2016-04-28 MED ORDER — CLINDAMYCIN PHOSPHATE 900 MG/6ML IJ SOLN
600.0000 mg | Freq: Once | INTRAMUSCULAR | Status: AC
  Administered 2016-04-28: 600 mg via INTRAMUSCULAR
  Filled 2016-04-28: qty 6

## 2016-04-28 MED ORDER — TRAMADOL HCL 50 MG PO TABS
50.0000 mg | ORAL_TABLET | Freq: Once | ORAL | Status: AC
  Administered 2016-04-28: 50 mg via ORAL
  Filled 2016-04-28: qty 1

## 2016-04-28 MED ORDER — TRAMADOL HCL 50 MG PO TABS: 50.0000 mg | ORAL_TABLET | Freq: Four times a day (QID) | ORAL | 0 refills | Status: DC | PRN

## 2016-04-28 MED ORDER — AMOXICILLIN 500 MG PO CAPS: 500.0000 mg | ORAL_CAPSULE | Freq: Three times a day (TID) | ORAL | 0 refills | Status: DC

## 2016-04-28 MED ORDER — LIDOCAINE-EPINEPHRINE 2 %-1:100000 IJ SOLN
INTRAMUSCULAR | Status: AC
  Filled 2016-04-28: qty 1.7

## 2016-04-28 NOTE — Discharge Instructions (Signed)
Please call and schedule a dental appointment as soon as possible. You will need to be seen within the next 14 days. Return to the emergency department for symptoms that change or worsen if you're unable to schedule an appointment.  OPTIONS FOR DENTAL FOLLOW UP CARE  [place] Department of Health and Human Services - Local Safety Net Dental Clinics http://www.ncdhhs.gov/dph/oralhealth/services/safetynetclinics.htm   [facility] ([phone])  [facility] ([phone])  [facility] ([phone])  [facility] ([phone])  [facility] ([phone]) This clinic caters to the indigent population and is on a lottery system. Location: [facility], [address], [place] Clinic Hours: Wednesdays from 6pm - 9pm, patients seen by a lottery system. For dates, call or go to [email or web address] Services: Cleanings, fillings and simple extractions. Payment Options: DENTAL WORK IS FREE OF CHARGE. Bring proof of income or support. Best way to get seen: Arrive at 5:15 pm - this is a lottery, NOT first come/first serve, so arriving earlier will not increase your chances of being seen.     [facility] [phone] Select option 1 for emergencies   Location: [facility], [address], [place] Clinic Hours: No walk-ins accepted - call the day before to schedule an appointment. Check in times are 9:30 am and 1:30 pm. Services: Simple extractions, temporary fillings, pulpectomy/pulp debridement, uncomplicated abscess drainage. Payment Options: PAYMENT IS DUE AT THE TIME OF SERVICE.  Fee is usually $100-200, additional surgical procedures (e.g. abscess drainage) may be extra. Cash, checks, Visa/MasterCard accepted.  Can file Medicaid if patient is covered for dental - patient should call case worker to check. No  discount for [facility] patients. Best way to get seen: MUST call the day before and get onto the schedule. Can usually be seen the next 1-2 days. No walk-ins accepted.     [facility] [phone]   Location: [facility], [address], [place] Clinic Hours: M, W, Th, F 8am or 1:30pm, Tues 9a or 1:30 - first come/first served. Services: Simple extractions, temporary fillings, uncomplicated abscess drainage.  You do not need to be an [place] resident. Payment Options: PAYMENT IS DUE AT THE TIME OF SERVICE. Dental insurance, otherwise sliding scale - bring proof of income or support. Depending on income and treatment needed, cost is usually $50-200. Best way to get seen: Arrive early as it is first come/first served.     [facility] [phone]   Location: [address] Clinic Hours: Mon-Thu 8a-5p Services: Most basic dental services including extractions and fillings. Payment Options: PAYMENT IS DUE AT THE TIME OF SERVICE. Sliding scale, up to 50% off - bring proof if income or support. Medicaid with dental option accepted. Best way to get seen: Call to schedule an appointment, can usually be seen within 2 weeks OR they will try to see walk-ins - show up at 8a or 2p (you may have to wait).     [facility] [phone] [place] RESIDENTS ONLY   Location: [facility], [address], [place], [place] [address] Clinic Hours: By appointment only. Monday - Thursday 8am-5pm, Friday 8am-12pm Services: Cleanings, fillings, extractions. Payment Options: PAYMENT IS DUE AT THE TIME OF SERVICE. Cash, Visa or MasterCard. Sliding scale - $30 minimum per service. Best way to get seen: Come in to office, complete packet and make an appointment -   need proof of income or support monies for each household member and proof of [place]  residence. Usually takes about a month to get in.     [facility] [phone]   Location: [address] [place] Clinic Hours: Walk-in Urgent Care Dental Services are offered Monday-Friday mornings only. The numbers of emergencies accepted daily is limited to the number of providers available. Maximum 15 - Mondays, Wednesdays & Thursdays Maximum 10 - Tuesdays & Fridays Services: You do not need to be a [place] resident to be seen for a dental emergency. Emergencies are defined as pain, swelling, abnormal bleeding, or dental trauma. Walkins will receive x-rays if needed. NOTE: Dental cleaning is not an emergency. Payment Options: PAYMENT IS DUE AT THE TIME OF SERVICE. Minimum co-pay is $40.00 for uninsured patients. Minimum co-pay is $3.00 for Medicaid with dental coverage. Dental Insurance is accepted and must be presented at time of visit. Medicare does not cover dental. Forms of payment: Cash, credit card, checks. Best way to get seen: If not previously registered with the clinic, walk-in dental registration begins at 7:15 am and is on a first come/first serve basis. If previously registered with the clinic, call to make an appointment.     [facility] [phone] [place] RESIDENTS ONLY   Location: [address], [place], [place] Clinic Hours: Mon-Thu 10a-2p Services: Extractions only! Payment Options: FREE (donations accepted) - bring proof of income or support Best way to get seen: Call and schedule an appointment OR come at 8am on the 1st Monday of every month (except for holidays) when it is first come/first served.     [facility] [phone]   Location: [address], [place] Clinic Hours: Friday mornings Services, Payment Options, Best way to get seen: Call for info  

## 2016-04-28 NOTE — ED Triage Notes (Signed)
Pt states that she took tylenol at 2pm and ibuprofen at 3pm with no relief.  Pt states that it hurts to turn her neck.

## 2016-04-28 NOTE — ED Triage Notes (Signed)
Pt states that the whole left side of her face hurts, including ear pain, throat pain.  Pt states that she cannot open her mouth well at this time.

## 2016-04-28 NOTE — ED Provider Notes (Signed)
[facility] Emergency Department Provider Note  ____________________________________________  Time seen: Approximately 8:16 PM  I have reviewed the triage vital signs and the nursing notes.   HISTORY  Chief Complaint Otalgia; Sore Throat; and Dental Pain    HPI Brianna Ashley is a 31 y.o. female that presents to the emergency department with an upper molar that has broken off. Patient states that molar broke 3 days ago. Patient has had extreme pain since then. Patient states her ear hurts and left side face hurts. Patient denies fever, shortness breath, chest pain, nausea, vomiting, abdominal pain. Patient has not seen a dentist since she was a child.   Past Medical History:  Diagnosis Date  . Asthma   . Carpal tunnel syndrome   . DVT of lower extremity (deep venous thrombosis) (HCC)   . Factor 5 Leiden mutation, heterozygous (HCC)   . Migraine   . Seizures [facility](HCC)     Patient Active Problem List   Diagnosis Date Noted  . Factor V Leiden (HCC) 03/28/2016  . Acute deep vein thrombosis (DVT) of femoral vein of left lower extremity (HCC) 03/28/2016  . Lupus anticoagulant positive 03/28/2016  . DVT (deep venous thrombosis) (HCC) 02/02/2016    Past Surgical History:  Procedure Laterality Date  . ABSCESS DRAINAGE    . CESAREAN SECTION    . DILATION AND CURETTAGE OF UTERUS      Prior to Admission medications   Medication Sig Start Date End Date Taking? Authorizing Provider  acetaminophen (TYLENOL) 325 MG tablet Take 2 tablets (650 mg total) by mouth every 6 (six) hours as needed for mild pain (or Fever >/= 101). 02/03/16   Ramonita LabAruna Gouru, MD  amoxicillin (AMOXIL) 500 MG capsule Take 1 capsule (500 mg total) by mouth 3 (three) times daily. 04/28/16   Enid DerryAshley Melvia Matousek, PA-C  apixaban (ELIQUIS) 5 MG TABS tablet Take 2 tablets (10 mg total) by mouth 2 (two) times daily. 02/10/16   Ramonita LabAruna Gouru, MD  citalopram (CELEXA) 40 MG tablet Take 1 tablet (40 mg total) by  mouth daily. 02/04/16   Ramonita LabAruna Gouru, MD  clonazePAM (KLONOPIN) 1 MG tablet Take 1 tablet (1 mg total) by mouth 3 (three) times daily. 02/03/16   Ramonita LabAruna Gouru, MD  docusate sodium (COLACE) 100 MG capsule Take 1 capsule (100 mg total) by mouth 2 (two) times daily as needed for mild constipation. 02/03/16   Ramonita LabAruna Gouru, MD  oxyCODONE (OXY IR/ROXICODONE) 5 MG immediate release tablet Take 1 tablet (5 mg total) by mouth every 6 (six) hours as needed for moderate pain or breakthrough pain. 02/03/16   Ramonita LabAruna Gouru, MD  QUEtiapine (SEROQUEL) 25 MG tablet Take 1 tablet (25 mg total) by mouth 2 (two) times daily. 02/03/16   Ramonita LabAruna Gouru, MD  traMADol (ULTRAM) 50 MG tablet Take 1 tablet (50 mg total) by mouth every 6 (six) hours as needed. 04/28/16 04/28/17  Enid DerryAshley Charnese Federici, PA-C    Allergies Ambien [zolpidem tartrate]; Ambien [zolpidem tartrate]; Ritalin [methylphenidate hcl]; and Ritalin [methylphenidate hcl]  Family History  Problem Relation Age of Onset  . Factor V Leiden deficiency Mother     deceased of drug overdose    Social History Social History  Substance Use Topics  . Smoking status: Current Every Day Smoker    Packs/day: 1.00    Years: 20.00    Types: Cigarettes  . Smokeless tobacco: Never Used  . Alcohol use No     Review of Systems  Constitutional: No fever/chills Cardiovascular: No  chest pain. Respiratory: No cough. No SOB. Gastrointestinal: No abdominal pain.  No nausea, no vomiting.  Musculoskeletal: Negative for musculoskeletal pain. Skin: Negative for rash, abrasions, lacerations, ecchymosis. Neurological: Negative for headaches, numbness or tingling   ____________________________________________   PHYSICAL EXAM:  VITAL SIGNS: ED Triage Vitals  Enc Vitals Group     BP 04/28/16 1935 120/64     Pulse Rate 04/28/16 1935 83     Resp 04/28/16 1935 18     Temp 04/28/16 1935 98.4 F (36.9 C)     Temp Source 04/28/16 1935 Oral     SpO2 04/28/16 1935 100 %     Weight  04/28/16 1936 226 lb (102.5 kg)     Height 04/28/16 1936 5\' 5"  (1.651 m)     Head Circumference --      Peak Flow --      Pain Score 04/28/16 1936 10     Pain Loc --      Pain Edu? --      Excl. in GC? --      Constitutional: Alert and oriented. Well appearing and in no acute distress. Eyes: Conjunctivae are normal. PERRL. EOMI. Head: Atraumatic. ENT:      Ears: Tympanic membranes pearly gray with good landmarks.      Nose: No congestion/rhinnorhea.      Mouth/Throat: Mucous membranes are moist. Tooth #16 fractured. Tenderness to palpation over fractured tooth. Multiple teeth with decay. Very poor dentition. No areas of fluctuance on the buccal or mucosal surfaces. No TMJ pain. No swelling. Neck: No stridor.   Cardiovascular: Normal rate, regular rhythm.  Good peripheral circulation. Respiratory: Normal respiratory effort without tachypnea or retractions. Lungs CTAB. Good air entry to the bases with no decreased or absent breath sounds. Musculoskeletal: Full range of motion to all extremities. No gross deformities appreciated. Neurologic:  Normal speech and language. No gross focal neurologic deficits are appreciated.  Skin:  Skin is warm, dry and intact. No rash noted. Psychiatric: Mood and affect are normal. Speech and behavior are normal. Patient exhibits appropriate insight and judgement.   ____________________________________________   LABS (all labs ordered are listed, but only abnormal results are displayed)  Labs Reviewed - No data to display ____________________________________________  EKG   ____________________________________________  RADIOLOGY  No results found.  ____________________________________________    PROCEDURES  Procedure(s) performed:    Procedures    Medications  lidocaine-EPINEPHrine (XYLOCAINE W/EPI) 2 %-1:100000 (with pres) injection (not administered)  clindamycin (CLEOCIN) injection 600 mg (600 mg Intramuscular Given 04/28/16  2124)  traMADol (ULTRAM) tablet 50 mg (50 mg Oral Given 04/28/16 2124)     ____________________________________________   INITIAL IMPRESSION / ASSESSMENT AND PLAN / ED COURSE  Pertinent labs & imaging results that were available during my care of the patient were reviewed by me and considered in my medical decision making (see chart for details).  Review of the [place] CSRS was performed in accordance of the [facility] prior to dispensing any controlled drugs.     Patient's diagnosis is consistent with fractured tooth. Vital signs and exam are reassuring. Dental block was performed in ED. Dental block relieved pain but patient began to have pain again before leaving ED. Patient states that she was in excruciating pain while I was in the room but when I left the room, I saw patient having a normal conversation with mother and not appearing to be in any distress. Patient was given tramadol before leaving ED. Patient was given clindamycin injection in ED.  No signs of Ludwig's angina. Patient was educated about serious infections and will return for any worsening or new symptoms. Patient was also given a list of other dental resources in the area. Patient was instructed to go to [facility] in the morning to walk in clinic.  Patient will be discharged home with prescriptions for amoxicillin and a very short course of tramadol. Patient is to follow up with dentist as directed. Patient is given ED precautions to return to the ED for any worsening or new symptoms.     ____________________________________________  FINAL CLINICAL IMPRESSION(S) / ED DIAGNOSES  Final diagnoses:  Closed fracture of tooth, initial encounter      NEW MEDICATIONS STARTED DURING THIS VISIT:  Discharge Medication List as of 04/28/2016  9:10 PM    START taking these medications   Details  amoxicillin (AMOXIL) 500 MG capsule Take 1 capsule (500 mg total) by mouth 3 (three) times daily., Starting Mon 04/28/2016, Print    traMADol  (ULTRAM) 50 MG tablet Take 1 tablet (50 mg total) by mouth every 6 (six) hours as needed., Starting Mon 04/28/2016, Until Tue 04/28/2017, Print            This chart was dictated using voice recognition software/Dragon. Despite best efforts to proofread, errors can occur which can change the meaning. Any change was purely unintentional.     Enid Derry, PA-C 04/28/16 2340    Arnaldo Natal, MD 04/28/16 2350

## 2016-05-15 ENCOUNTER — Emergency Department: Payer: Medicaid Other

## 2016-05-15 ENCOUNTER — Encounter: Payer: Self-pay | Admitting: *Deleted

## 2016-05-15 ENCOUNTER — Emergency Department
Admission: EM | Admit: 2016-05-15 | Discharge: 2016-05-15 | Disposition: A | Discharge status: Home / Self Care | Payer: Medicaid Other | Attending: Emergency Medicine | Admitting: Emergency Medicine

## 2016-05-15 DIAGNOSIS — F17210 Nicotine dependence, cigarettes, uncomplicated: Secondary | ICD-10-CM | POA: Diagnosis not present

## 2016-05-15 DIAGNOSIS — Y999 Unspecified external cause status: Secondary | ICD-10-CM | POA: Insufficient documentation

## 2016-05-15 DIAGNOSIS — X501XXA Overexertion from prolonged static or awkward postures, initial encounter: Secondary | ICD-10-CM | POA: Insufficient documentation

## 2016-05-15 DIAGNOSIS — J45909 Unspecified asthma, uncomplicated: Secondary | ICD-10-CM | POA: Diagnosis not present

## 2016-05-15 DIAGNOSIS — Y929 Unspecified place or not applicable: Secondary | ICD-10-CM | POA: Diagnosis not present

## 2016-05-15 DIAGNOSIS — S99912A Unspecified injury of left ankle, initial encounter: Secondary | ICD-10-CM | POA: Diagnosis present

## 2016-05-15 DIAGNOSIS — Y9301 Activity, walking, marching and hiking: Secondary | ICD-10-CM | POA: Diagnosis not present

## 2016-05-15 DIAGNOSIS — Z79899 Other long term (current) drug therapy: Secondary | ICD-10-CM | POA: Diagnosis not present

## 2016-05-15 DIAGNOSIS — S93402A Sprain of unspecified ligament of left ankle, initial encounter: Secondary | ICD-10-CM

## 2016-05-15 MED ORDER — TRAMADOL HCL 50 MG PO TABS: 50.0000 mg | ORAL_TABLET | Freq: Two times a day (BID) | ORAL | 0 refills | Status: DC

## 2016-05-15 MED ORDER — CYCLOBENZAPRINE HCL 5 MG PO TABS: 5.0000 mg | ORAL_TABLET | Freq: Three times a day (TID) | ORAL | 0 refills | Status: DC | PRN

## 2016-05-15 NOTE — ED Triage Notes (Addendum)
Pt to triage via wheelchair.  Pt has left ankle pain.   States twisted ankle this week while walking.

## 2016-05-15 NOTE — Discharge Instructions (Signed)
Take the prescription meds as directed. Rest, ice, and elevate the foot when seated. Follow-up with podiatry for continued symptoms. Wear the brace for support.

## 2016-05-15 NOTE — ED Provider Notes (Signed)
[facility] Emergency Department Provider Note ____________________________________________  Time seen: 2310  I have reviewed the triage vital signs and the nursing notes.  HISTORY  Chief Complaint  Ankle Pain  HPI Brianna Ashley is a 31 y.o. female resents to the ED for evaluation of left ankle pain after a mechanical sprain 3 days prior. Patient describes she twisted her ankle while walking up the heel on Monday. Since that time she's had dorsal and medial ankle pain, swelling, disability. She denies any other injury at this time. She gives a remote history of a recent foot or ankle fracturein August, initially evaluated at a local urgent care.  Past Medical History:  Diagnosis Date  . Asthma   . Carpal tunnel syndrome   . DVT of lower extremity (deep venous thrombosis) (HCC)   . Factor 5 Leiden mutation, heterozygous (HCC)   . Migraine   . Seizures [facility](HCC)     Patient Active Problem List   Diagnosis Date Noted  . Factor V Leiden (HCC) 03/28/2016  . Acute deep vein thrombosis (DVT) of femoral vein of left lower extremity (HCC) 03/28/2016  . Lupus anticoagulant positive 03/28/2016  . DVT (deep venous thrombosis) (HCC) 02/02/2016    Past Surgical History:  Procedure Laterality Date  . ABSCESS DRAINAGE    . CESAREAN SECTION    . DILATION AND CURETTAGE OF UTERUS      Prior to Admission medications   Medication Sig Start Date End Date Taking? Authorizing Provider  acetaminophen (TYLENOL) 325 MG tablet Take 2 tablets (650 mg total) by mouth every 6 (six) hours as needed for mild pain (or Fever >/= 101). 02/03/16   Ramonita LabAruna Gouru, MD  amoxicillin (AMOXIL) 500 MG capsule Take 1 capsule (500 mg total) by mouth 3 (three) times daily. 04/28/16   Enid DerryAshley Wagner, PA-C  apixaban (ELIQUIS) 5 MG TABS tablet Take 2 tablets (10 mg total) by mouth 2 (two) times daily. 02/10/16   Ramonita LabAruna Gouru, MD  citalopram (CELEXA) 40 MG tablet Take 1 tablet (40 mg total) by mouth daily.  02/04/16   Ramonita LabAruna Gouru, MD  clonazePAM (KLONOPIN) 1 MG tablet Take 1 tablet (1 mg total) by mouth 3 (three) times daily. 02/03/16   Ramonita LabAruna Gouru, MD  cyclobenzaprine (FLEXERIL) 5 MG tablet Take 1 tablet (5 mg total) by mouth 3 (three) times daily as needed for muscle spasms. 05/15/16   Yzabelle Calles V Bacon Roshan Roback, PA-C  docusate sodium (COLACE) 100 MG capsule Take 1 capsule (100 mg total) by mouth 2 (two) times daily as needed for mild constipation. 02/03/16   Ramonita LabAruna Gouru, MD  oxyCODONE (OXY IR/ROXICODONE) 5 MG immediate release tablet Take 1 tablet (5 mg total) by mouth every 6 (six) hours as needed for moderate pain or breakthrough pain. 02/03/16   Ramonita LabAruna Gouru, MD  QUEtiapine (SEROQUEL) 25 MG tablet Take 1 tablet (25 mg total) by mouth 2 (two) times daily. 02/03/16   Ramonita LabAruna Gouru, MD  traMADol (ULTRAM) 50 MG tablet Take 1 tablet (50 mg total) by mouth 2 (two) times daily. 05/15/16   Charlesetta IvoryJenise V Bacon Karolee Meloni, PA-C    Allergies Ambien [zolpidem tartrate]; Ambien [zolpidem tartrate]; Ritalin [methylphenidate hcl]; and Ritalin [methylphenidate hcl]  Family History  Problem Relation Age of Onset  . Factor V Leiden deficiency Mother     deceased of drug overdose    Social History Social History  Substance Use Topics  . Smoking status: Current Every Day Smoker    Packs/day: 1.00    Years:  20.00    Types: Cigarettes  . Smokeless tobacco: Never Used  . Alcohol use No    Review of Systems  Constitutional: Negative for fever. Musculoskeletal: Negative for back pain. Left ankle pain as above. Skin: Negative for rash. Neurological: Negative for headaches, focal weakness or numbness. ____________________________________________  PHYSICAL EXAM:  VITAL SIGNS: ED Triage Vitals  Enc Vitals Group     BP 05/15/16 2227 140/89     Pulse Rate 05/15/16 2227 91     Resp 05/15/16 2227 18     Temp 05/15/16 2227 99 F (37.2 C)     Temp Source 05/15/16 2227 Oral     SpO2 05/15/16 2227 100 %     Weight  05/15/16 2228 226 lb (102.5 kg)     Height 05/15/16 2228 5\' 5"  (1.651 m)     Head Circumference --      Peak Flow --      Pain Score 05/15/16 2229 8     Pain Loc --      Pain Edu? --      Excl. in GC? --     Constitutional: Alert and oriented. Well appearing and in no distress. Head: Normocephalic and atraumatic. Cardiovascular: Normal rate, regular rhythm. Normal distal pulses. Respiratory: Normal respiratory effort. No wheezes/rales/rhonchi. Musculoskeletal: Left ankle with minimal swelling noted medially and laterally. Patient with normal active range of motion to the ankle in all planes. Negative drawer sign. No calf or joint tenderness is noted. Nontender with normal range of motion in all extremities.  Neurologic:  Normal gait without ataxia. Normal speech and language. No gross focal neurologic deficits are appreciated. Skin:  Skin is warm, dry and intact. No rash noted. ____________________________________________   RADIOLOGY  Left Ankle  IMPRESSION: Soft tissue swelling without fracture or dislocation of the left ankle.  I, Carlicia Leavens, Charlesetta Ivory, personally viewed and evaluated these images (plain radiographs) as part of my medical decision making, as well as reviewing the written report by the radiologist. ____________________________________________  PROCEDURES  Ankle Stirrup Splint ____________________________________________  INITIAL IMPRESSION / ASSESSMENT AND PLAN / ED COURSE  Patient with a grade 1 ankle sprain of the left without evidence of fracture dislocation. His discharge at this time with a Velcro stirrup splint for support. She has crutches at home from previous injury that she may use tamponade. She is referred to podiatry for further management of her ankle sprain. Prescription for Flexeril and Ultram provided at this time for acute pain relief. Workup was also provided tonight at the patient's  request. ____________________________________________  FINAL CLINICAL IMPRESSION(S) / ED DIAGNOSES  Final diagnoses:  Sprain of left ankle, unspecified ligament, initial encounter      Lissa Hoard, PA-C 05/15/16 2342    Jene Every, MD 05/16/16 [phone]

## 2016-05-27 ENCOUNTER — Emergency Department
Admission: EM | Admit: 2016-05-27 | Discharge: 2016-05-27 | Disposition: A | Discharge status: Home / Self Care | Payer: Medicaid Other | Attending: Emergency Medicine | Admitting: Emergency Medicine

## 2016-05-27 DIAGNOSIS — Z79899 Other long term (current) drug therapy: Secondary | ICD-10-CM | POA: Diagnosis not present

## 2016-05-27 DIAGNOSIS — F17210 Nicotine dependence, cigarettes, uncomplicated: Secondary | ICD-10-CM | POA: Diagnosis not present

## 2016-05-27 DIAGNOSIS — K0889 Other specified disorders of teeth and supporting structures: Secondary | ICD-10-CM

## 2016-05-27 DIAGNOSIS — J45909 Unspecified asthma, uncomplicated: Secondary | ICD-10-CM | POA: Diagnosis not present

## 2016-05-27 DIAGNOSIS — K030 Excessive attrition of teeth: Secondary | ICD-10-CM | POA: Diagnosis not present

## 2016-05-27 NOTE — ED Triage Notes (Signed)
Pt states that she had her L upper wisdom tooth pulled and another tooth beside it a week ago Monday.  Pt states that she was unable to take Keflex prescribed to her because it made her "puke my guts up".  Pt states that dentist was supposed to put stitches in her gums, but she was called and notified that this did not happen either.  Pt understanding that she will need to follow up with dentist, but states that she just wants to be checked to see if she has an infection in her mouth.

## 2016-05-27 NOTE — ED Provider Notes (Signed)
[facility] Emergency Department Provider Note  ____________________________________________  Time seen: Approximately 7:49 PM  I have reviewed the triage vital signs and the nursing notes.   HISTORY  Chief Complaint Dental Pain    HPI Tea DETRICE CALES is a 31 y.o. female who presents emergency department complaining of left upper dental pain. Patient states that he she had a wisdom tooth extracted as well as one of her molars to the left upper dentition. This occurred 8 days prior. She is placed on Keflex but states that it upset her stomach and she stopped taking the medication. She has had continued pain to the region and is requesting a check to make sure area is not infected. She denies any fevers or chills, difficulty breathing or swallowing. She is out of her pain medication and is also requesting refill pain medication. No other complaints at this time.   Past Medical History:  Diagnosis Date  . Asthma   . Carpal tunnel syndrome   . DVT of lower extremity (deep venous thrombosis) (HCC)   . Factor 5 Leiden mutation, heterozygous (HCC)   . Migraine   . Seizures [facility])     Patient Active Problem List   Diagnosis Date Noted  . Factor V Leiden (HCC) 03/28/2016  . Acute deep vein thrombosis (DVT) of femoral vein of left lower extremity (HCC) 03/28/2016  . Lupus anticoagulant positive 03/28/2016  . DVT (deep venous thrombosis) (HCC) 02/02/2016    Past Surgical History:  Procedure Laterality Date  . ABSCESS DRAINAGE    . CESAREAN SECTION    . DILATION AND CURETTAGE OF UTERUS      Prior to Admission medications   Medication Sig Start Date End Date Taking? Authorizing Provider  acetaminophen (TYLENOL) 325 MG tablet Take 2 tablets (650 mg total) by mouth every 6 (six) hours as needed for mild pain (or Fever >/= 101). 02/03/16   Ramonita Lab, MD  amoxicillin (AMOXIL) 500 MG capsule Take 1 capsule (500 mg total) by mouth 3 (three) times daily. 04/28/16    Enid Derry, PA-C  apixaban (ELIQUIS) 5 MG TABS tablet Take 2 tablets (10 mg total) by mouth 2 (two) times daily. 02/10/16   Ramonita Lab, MD  citalopram (CELEXA) 40 MG tablet Take 1 tablet (40 mg total) by mouth daily. 02/04/16   Ramonita Lab, MD  clonazePAM (KLONOPIN) 1 MG tablet Take 1 tablet (1 mg total) by mouth 3 (three) times daily. 02/03/16   Ramonita Lab, MD  cyclobenzaprine (FLEXERIL) 5 MG tablet Take 1 tablet (5 mg total) by mouth 3 (three) times daily as needed for muscle spasms. 05/15/16   Jenise V Bacon Menshew, PA-C  docusate sodium (COLACE) 100 MG capsule Take 1 capsule (100 mg total) by mouth 2 (two) times daily as needed for mild constipation. 02/03/16   Ramonita Lab, MD  oxyCODONE (OXY IR/ROXICODONE) 5 MG immediate release tablet Take 1 tablet (5 mg total) by mouth every 6 (six) hours as needed for moderate pain or breakthrough pain. 02/03/16   Ramonita Lab, MD  QUEtiapine (SEROQUEL) 25 MG tablet Take 1 tablet (25 mg total) by mouth 2 (two) times daily. 02/03/16   Ramonita Lab, MD  traMADol (ULTRAM) 50 MG tablet Take 1 tablet (50 mg total) by mouth 2 (two) times daily. 05/15/16   Charlesetta Ivory Menshew, PA-C    Allergies Ambien [zolpidem tartrate]; Ambien [zolpidem tartrate]; Clindamycin/lincomycin; Keflex [cephalexin]; Ritalin [methylphenidate hcl]; and Ritalin [methylphenidate hcl]  Family History  Problem Relation Age of  Onset  . Factor V Leiden deficiency Mother     deceased of drug overdose    Social History Social History  Substance Use Topics  . Smoking status: Current Every Day Smoker    Packs/day: 1.00    Years: 20.00    Types: Cigarettes  . Smokeless tobacco: Never Used  . Alcohol use No     Review of Systems  Constitutional: No fever/chills Eyes: No visual changes. No discharge ENT: Positive for left upper dental pain Cardiovascular: no chest pain. Respiratory: no cough. No SOB. Gastrointestinal: No abdominal pain.  No nausea, no vomiting.  No diarrhea.   No constipation. Musculoskeletal: Negative for musculoskeletal pain. Skin: Negative for rash, abrasions, lacerations, ecchymosis. Neurological: Negative for headaches, focal weakness or numbness. 10-point ROS otherwise negative.  ____________________________________________   PHYSICAL EXAM:  VITAL SIGNS: ED Triage Vitals  Enc Vitals Group     BP 05/27/16 1945 (!) 117/59     Pulse Rate 05/27/16 1945 78     Resp 05/27/16 1945 18     Temp 05/27/16 1945 98.9 F (37.2 C)     Temp Source 05/27/16 1945 Oral     SpO2 05/27/16 1945 100 %     Weight 05/27/16 1946 226 lb (102.5 kg)     Height 05/27/16 1946 5\' 5"  (1.651 m)     Head Circumference --      Peak Flow --      Pain Score 05/27/16 1947 8     Pain Loc --      Pain Edu? --      Excl. in GC? --      Constitutional: Alert and oriented. Well appearing and in no acute distress. Eyes: Conjunctivae are normal. PERRL. EOMI. Head: Atraumatic. ENT:      Ears:       Nose: No congestion/rhinnorhea.      Mouth/Throat: Mucous membranes are moist. Dental extraction site is visualized over tooth #16 and was tooth the left upper dentition. Area is healing well with no signs of infection. No evidence of dry socket. No bleeding. A pressure drainage. Oropharynx is otherwise nonerythematous and nonedematous. Multiple dental erosions, cavities, missing dentition is visualized. Neck: No stridor.   Hematological/Lymphatic/Immunilogical: No cervical lymphadenopathy. Cardiovascular: Normal rate, regular rhythm. Normal S1 and S2.  Good peripheral circulation. Respiratory: Normal respiratory effort without tachypnea or retractions. Lungs CTAB. Good air entry to the bases with no decreased or absent breath sounds. Musculoskeletal: Full range of motion to all extremities. No gross deformities appreciated. Neurologic:  Normal speech and language. No gross focal neurologic deficits are appreciated.  Skin:  Skin is warm, dry and intact. No rash  noted. Psychiatric: Mood and affect are normal. Speech and behavior are normal. Patient exhibits appropriate insight and judgement.   ____________________________________________   LABS (all labs ordered are listed, but only abnormal results are displayed)  Labs Reviewed - No data to display ____________________________________________  EKG   ____________________________________________  RADIOLOGY   No results found.  ____________________________________________    PROCEDURES  Procedure(s) performed:    Procedures    Medications - No data to display   ____________________________________________   INITIAL IMPRESSION / ASSESSMENT AND PLAN / ED COURSE  Pertinent labs & imaging results that were available during my care of the patient were reviewed by me and considered in my medical decision making (see chart for details).  Review of the [place] CSRS was performed in accordance of the [facility] prior to dispensing any controlled drugs.  Patient's diagnosis is consistent with dental pain from dental extraction site. No evidence of dry socket. No evidence of infection. At this time, the patient will not be placed on antibiotics. She is requesting pain medication and I have informed her that she may seek refill of medication from dentist but at this time, we would not refill any narcotic prescriptions.. Patient will follow-up with dentist for any further concerns. Patient is given ED precautions to return to the ED for any worsening or new symptoms.     ____________________________________________  FINAL CLINICAL IMPRESSION(S) / ED DIAGNOSES  Final diagnoses:  Pain, dental  Dental attrition, excessive      NEW MEDICATIONS STARTED DURING THIS VISIT:  Discharge Medication List as of 05/27/2016  8:13 PM          This chart was dictated using voice recognition software/Dragon. Despite best efforts to proofread, errors can occur which can change the meaning.  Any change was purely unintentional.    Racheal PatchesJonathan D Cuthriell, PA-C 05/27/16 2101    Sharman CheekPhillip Stafford, MD 05/30/16 [phone]

## 2016-07-02 ENCOUNTER — Emergency Department
Admission: EM | Admit: 2016-07-02 | Discharge: 2016-07-03 | Disposition: A | Discharge status: Home / Self Care | Payer: Medicaid Other | Attending: Emergency Medicine | Admitting: Emergency Medicine

## 2016-07-02 ENCOUNTER — Encounter: Payer: Self-pay | Admitting: Emergency Medicine

## 2016-07-02 ENCOUNTER — Emergency Department: Payer: Medicaid Other

## 2016-07-02 DIAGNOSIS — F17210 Nicotine dependence, cigarettes, uncomplicated: Secondary | ICD-10-CM | POA: Diagnosis not present

## 2016-07-02 DIAGNOSIS — J45909 Unspecified asthma, uncomplicated: Secondary | ICD-10-CM | POA: Insufficient documentation

## 2016-07-02 DIAGNOSIS — R600 Localized edema: Secondary | ICD-10-CM | POA: Insufficient documentation

## 2016-07-02 DIAGNOSIS — R609 Edema, unspecified: Secondary | ICD-10-CM

## 2016-07-02 DIAGNOSIS — M79605 Pain in left leg: Secondary | ICD-10-CM

## 2016-07-02 DIAGNOSIS — D6851 Activated protein C resistance: Secondary | ICD-10-CM | POA: Insufficient documentation

## 2016-07-02 LAB — CBC WITH DIFFERENTIAL/PLATELET
Basophils Absolute: 0 10*3/uL (ref 0–0.1)
Basophils Relative: 0 %
Eosinophils Absolute: 0.4 10*3/uL (ref 0–0.7)
Eosinophils Relative: 4 %
HCT: 38.7 % (ref 35.0–47.0)
Hemoglobin: 13.3 g/dL (ref 12.0–16.0)
Lymphocytes Relative: 31 %
Lymphs Abs: 3.1 10*3/uL (ref 1.0–3.6)
MCH: 31.2 pg (ref 26.0–34.0)
MCHC: 34.3 g/dL (ref 32.0–36.0)
MCV: 91 fL (ref 80.0–100.0)
Monocytes Absolute: 0.6 10*3/uL (ref 0.2–0.9)
Monocytes Relative: 6 %
Neutro Abs: 5.8 10*3/uL (ref 1.4–6.5)
Neutrophils Relative %: 59 %
Platelets: 269 10*3/uL (ref 150–440)
RBC: 4.25 MIL/uL (ref 3.80–5.20)
RDW: 13.8 % (ref 11.5–14.5)
WBC: 9.9 10*3/uL (ref 3.6–11.0)

## 2016-07-02 LAB — COMPREHENSIVE METABOLIC PANEL
ALT: 12 U/L — ABNORMAL LOW (ref 14–54)
AST: 18 U/L (ref 15–41)
Albumin: 3.9 g/dL (ref 3.5–5.0)
Alkaline Phosphatase: 61 U/L (ref 38–126)
Anion gap: 5 (ref 5–15)
BUN: 11 mg/dL (ref 6–20)
CO2: 23 mmol/L (ref 22–32)
Calcium: 9.1 mg/dL (ref 8.9–10.3)
Chloride: 108 mmol/L (ref 101–111)
Creatinine, Ser: 0.74 mg/dL (ref 0.44–1.00)
GFR calc Af Amer: >60 mL/min (ref >60)
GFR calc non Af Amer: >60 mL/min (ref >60)
Glucose, Bld: 75 mg/dL (ref 65–99)
Potassium: 3.9 mmol/L (ref 3.5–5.1)
Sodium: 136 mmol/L (ref 135–145)
Total Bilirubin: 0.3 mg/dL (ref 0.3–1.2)
Total Protein: 6.9 g/dL (ref 6.5–8.1)

## 2016-07-02 MED ORDER — MEDICAL COMPRESSION STOCKINGS MISC: 1.0000 [IU] | Freq: Once | 1 refills | Status: AC

## 2016-07-02 NOTE — ED Notes (Signed)
Spoke with PA in pod D regarding pt's presenting c/o and u/s results; st to order labs & will evaluate pt further is WNL

## 2016-07-02 NOTE — ED Triage Notes (Addendum)
Pt ambulatory to triage with steady slow gait, no distress noted. Pt c/o of left leg pain and swelling x1 day, HX of blood clots in same leg, diagnosed in 2008. Pt takes Eliquis twice per day. Pt denies SOB.

## 2016-07-02 NOTE — ED Provider Notes (Signed)
[facility] Emergency Department Provider Note  ____________________________________________  Time seen: Approximately 11:38 PM  I have reviewed the triage vital signs and the nursing notes.   HISTORY  Chief Complaint Leg Pain    HPI Brianna Ashley is a 31 y.o. female Who presents emergency department complaining of lef leg pain and edema. 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 Patient also has a history of factor V Leiden. Patient reports that the last time her leg swollen she was evaluated and diagnosed with DVT to the left lower extremity. Patient was hospitalized and given anticoagulation and discharged home without cause.  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 She reports that the edema has improved somewhat today but pain continues to the entire left lower extremity. Pain is from thigh to foot. Patient has been taking her anticoagulation as prescribed.No trauma. No back pain. Patient denies any chest pain, shortness of breath, palpitations. No other complaints at this time.   Past Medical History:  Diagnosis Date  . Asthma   . Carpal tunnel syndrome   . DVT of lower extremity (deep venous thrombosis) (HCC)   . Factor 5 Leiden mutation, heterozygous (HCC)   . Migraine   . Seizures [facility])     Patient Active Problem List   Diagnosis Date Noted  . Factor V Leiden (HCC) 03/28/2016  . Acute deep vein thrombosis (DVT) of femoral vein of left lower extremity (HCC) 03/28/2016  . Lupus anticoagulant positive 03/28/2016  . DVT (deep venous thrombosis) (HCC) 02/02/2016    Past Surgical History:  Procedure Laterality Date  . ABSCESS DRAINAGE    . CESAREAN SECTION    . DILATION AND CURETTAGE OF UTERUS      Prior to Admission medications   Medication Sig Start Date End Date Taking? Authorizing Provider  acetaminophen (TYLENOL) 325 MG  tablet Take 2 tablets (650 mg total) by mouth every 6 (six) hours as needed for mild pain (or Fever >/= 101). 02/03/16   Ramonita Lab, MD  amoxicillin (AMOXIL) 500 MG capsule Take 1 capsule (500 mg total) by mouth 3 (three) times daily. 04/28/16   Enid Derry, PA-C  apixaban (ELIQUIS) 5 MG TABS tablet Take 2 tablets (10 mg total) by mouth 2 (two) times daily. 02/10/16   Ramonita Lab, MD  citalopram (CELEXA) 40 MG tablet Take 1 tablet (40 mg total) by mouth daily. 02/04/16   Ramonita Lab, MD  clonazePAM (KLONOPIN) 1 MG tablet Take 1 tablet (1 mg total) by mouth 3 (three) times daily. 02/03/16   Ramonita Lab, MD  cyclobenzaprine (FLEXERIL) 5 MG tablet Take 1 tablet (5 mg total) by mouth 3 (three) times daily as needed for muscle spasms. 05/15/16   Jenise V Bacon Menshew, PA-C  docusate sodium (COLACE) 100 MG capsule Take 1 capsule (100 mg total) by mouth 2 (two) times daily as needed for mild constipation. 02/03/16   Ramonita Lab, MD  Elastic Bandages & Supports (MEDICAL COMPRESSION STOCKINGS) MISC 1 Units by Does not apply route once. 07/03/16 07/03/16  Christiane Ha D Legion Discher, PA-C  oxyCODONE (OXY IR/ROXICODONE) 5 MG immediate release tablet Take 1 tablet (5 mg total) by mouth every 6 (six) hours as needed for moderate pain or breakthrough pain. 02/03/16   Ramonita Lab, MD  QUEtiapine (SEROQUEL) 25 MG tablet Take 1 tablet (25 mg total) by mouth 2 (two) times daily. 02/03/16  Ramonita Lab, MD  traMADol (ULTRAM) 50 MG tablet Take 1 tablet (50 mg total) by mouth 2 (two) times daily. 05/15/16   Charlesetta Ivory Menshew, PA-C    Allergies Ambien [zolpidem tartrate]; Ambien [zolpidem tartrate]; Clindamycin/lincomycin; Keflex [cephalexin]; Ritalin [methylphenidate hcl]; and Ritalin [methylphenidate hcl]  Family History  Problem Relation Age of Onset  . Factor V Leiden deficiency Mother     deceased of drug overdose    Social History Social History  Substance Use Topics  . Smoking status: Current Every Day Smoker     Packs/day: 1.00    Years: 20.00    Types: Cigarettes  . Smokeless tobacco: Never Used  . Alcohol use No     Review of Systems  Constitutional: No fever/chills Eyes: No visual changes.  Cardiovascular: no chest pain. Respiratory: no cough. No SOB. Gastrointestinal: No abdominal pain.  No nausea, no vomiting.   Musculoskeletal: Positive for left lower extremity pain and swelling. Skin: Negative for rash, abrasions, lacerations, ecchymosis. Neurological: Negative for headaches, focal weakness or numbness. 10-point ROS otherwise negative.  ____________________________________________   PHYSICAL EXAM:  VITAL SIGNS: ED Triage Vitals [07/02/16 2040]  Enc Vitals Group     BP 140/87     Pulse Rate 90     Resp 16     Temp 97.9 F (36.6 C)     Temp Source Oral     SpO2 100 %     Weight 226 lb (102.5 kg)     Height  (1.6 m)     Head Circumference      Peak Flow      Pain Score      Pain Loc      Pain Edu?      Excl. in GC?      Constitutional: Alert and oriented. Well appearing and in no acute distress. Eyes: Conjunctivae are normal. PERRL. EOMI. Head: Atraumatic. Neck: No stridor.    Cardiovascular: Normal rate, regular rhythm. Normal S1 and S2.  Good peripheral circulation. Respiratory: Normal respiratory effort without tachypnea or retractions. Lungs CTAB. Good air entry to the bases with no decreased or absent breath sounds. Musculoskeletal: Full range of motion to all extremities. No gross deformities appreciated.The entire left lower extremity is edematous. Patient does have compression stockings on, no significant edema proximal to the compression stockings when compared with her compression stockings. Dorsalis pedis pulse intact. Sensation and cap refill intact all 5 digits. No erythema to lower extremity. No warmth to palpation. Neurologic:  Normal speech and language. No gross focal neurologic deficits are appreciated.  Skin:  Skin is warm, dry and intact.  No rash noted. Psychiatric: Mood and affect are normal. Speech and behavior are normal. Patient exhibits appropriate insight and judgement.   ____________________________________________   LABS (all labs ordered are listed, but only abnormal results are displayed)  Labs Reviewed  COMPREHENSIVE METABOLIC PANEL - Abnormal; Notable for the following:       Result Value   ALT 12 (*)    All other components within normal limits  CBC WITH DIFFERENTIAL/PLATELET   ____________________________________________  EKG   ____________________________________________  RADIOLOGY Festus Barren Bernadine Melecio, personally viewed and evaluated these images as part of my medical decision making, as well as reviewing the written report by the radiologist.  US Venous Img Lower Unilateral Left  Result Date: 07/02/2016 CLINICAL DATA:  Left leg pain and edema EXAM: LEFT LOWER EXTREMITY VENOUS DOPPLER ULTRASOUND TECHNIQUE: Gray-scale sonography with graded compression, as well as color  Doppler and duplex ultrasound were performed to evaluate the lower extremity deep venous systems from the level of the common femoral vein and including the common femoral, femoral, profunda femoral, popliteal and calf veins including the posterior tibial, peroneal and gastrocnemius veins when visible. The superficial great saphenous vein was also interrogated. Spectral Doppler was utilized to evaluate flow at rest and with distal augmentation maneuvers in the common femoral, femoral and popliteal veins. COMPARISON:  None. FINDINGS: Contralateral Common Femoral Vein: No evidence of thrombus. Normal compressibility. Common Femoral Vein: No evidence of thrombus. Normal compressibility, respiratory phasicity and response to augmentation. Saphenofemoral Junction: No evidence of thrombus. Normal compressibility and flow on color Doppler imaging. Profunda Femoral Vein: No evidence of thrombus. Normal compressibility and flow on color Doppler  imaging. Femoral Vein: No evidence of thrombus. Normal compressibility, respiratory phasicity and response to augmentation. Popliteal Vein: No evidence of thrombus. Normal compressibility, respiratory phasicity and response to augmentation. Calf Veins: No evidence of thrombus of the peroneal and posterior tibial veins imaged. Normal compressibility and flow on color Doppler imaging. Other Findings:  None. IMPRESSION: No evidence of deep venous thrombosis. Electronically Signed   By: Tollie Eth M.D.   On: 07/02/2016 21:46    ____________________________________________    PROCEDURES  Procedure(s) performed:    Procedures    Medications - No data to display   ____________________________________________   INITIAL IMPRESSION / ASSESSMENT AND PLAN / ED COURSE  Pertinent labs & imaging results that were available during my care of the patient were reviewed by me and considered in my medical decision making (see chart for details).  Review of the [place] CSRS was performed in accordance of the [facility] prior to dispensing any controlled drugs.     Patient's diagnosis is consistent with left lower leg pain with edema. Patient presents with left lower shin any edema and pain. Patient does have a history of DVT from 2008. She also has a history of factor V Leiden. Patient is taking liquids as prescribed. Ultrasound was negative for DVT. Exam is reassuring. While there is edema, there appears to be chronic edema to left lower extremity. No concerning signs of cellulitis or other infection. Patient is to continue AT home. Motrin as needed for pain. Patient will be prescribed new compression stockings. Patient will follow up with primary care. Patient is given ED precautions to return to the ED for any worsening or new symptoms.     ____________________________________________  FINAL CLINICAL IMPRESSION(S) / ED DIAGNOSES  Final diagnoses:  Left leg pain  Leg edema, left  Factor V Leiden (HCC)       NEW MEDICATIONS STARTED DURING THIS VISIT:  New Prescriptions   ELASTIC BANDAGES & SUPPORTS (MEDICAL COMPRESSION STOCKINGS) MISC    1 Units by Does not apply route once.        This chart was dictated using voice recognition software/Dragon. Despite best efforts to proofread, errors can occur which can change the meaning. Any change was purely unintentional.    Racheal Patches, PA-C 07/03/16 0003    Nita Sickle, MD 07/07/16 1739

## 2016-07-09 ENCOUNTER — Emergency Department
Admission: EM | Admit: 2016-07-09 | Discharge: 2016-07-10 | Disposition: A | Discharge status: Home / Self Care | Payer: Medicaid Other | Attending: Emergency Medicine | Admitting: Emergency Medicine

## 2016-07-09 DIAGNOSIS — F17210 Nicotine dependence, cigarettes, uncomplicated: Secondary | ICD-10-CM | POA: Diagnosis not present

## 2016-07-09 DIAGNOSIS — R55 Syncope and collapse: Secondary | ICD-10-CM

## 2016-07-09 DIAGNOSIS — J45909 Unspecified asthma, uncomplicated: Secondary | ICD-10-CM | POA: Diagnosis not present

## 2016-07-09 LAB — URINALYSIS, COMPLETE (UACMP) WITH MICROSCOPIC
BILIRUBIN URINE: NEGATIVE
Bacteria, UA: NONE SEEN
Glucose, UA: NEGATIVE mg/dL
HGB URINE DIPSTICK: NEGATIVE
Ketones, ur: NEGATIVE mg/dL
Leukocytes, UA: NEGATIVE
NITRITE: NEGATIVE
PH: 6 (ref 5.0–8.0)
Protein, ur: NEGATIVE mg/dL
SPECIFIC GRAVITY, URINE: 1.002 — AB (ref 1.005–1.030)

## 2016-07-09 LAB — BASIC METABOLIC PANEL
ANION GAP: 5 (ref 5–15)
BUN: 11 mg/dL (ref 6–20)
CALCIUM: 9 mg/dL (ref 8.9–10.3)
CO2: 25 mmol/L (ref 22–32)
CREATININE: 0.98 mg/dL (ref 0.44–1.00)
Chloride: 105 mmol/L (ref 101–111)
GFR calc Af Amer: >60 mL/min (ref >60)
GLUCOSE: 106 mg/dL — AB (ref 65–99)
Potassium: 3.3 mmol/L — ABNORMAL LOW (ref 3.5–5.1)
Sodium: 135 mmol/L (ref 135–145)

## 2016-07-09 LAB — CBC
HCT: 38.1 % (ref 35.0–47.0)
Hemoglobin: 12.9 g/dL (ref 12.0–16.0)
MCH: 30.2 pg (ref 26.0–34.0)
MCHC: 33.9 g/dL (ref 32.0–36.0)
MCV: 89 fL (ref 80.0–100.0)
PLATELETS: 293 10*3/uL (ref 150–440)
RBC: 4.28 MIL/uL (ref 3.80–5.20)
RDW: 13.8 % (ref 11.5–14.5)
WBC: 9.1 10*3/uL (ref 3.6–11.0)

## 2016-07-09 LAB — TROPONIN I: Troponin I: 0.03 ng/mL (ref <0.03)

## 2016-07-09 LAB — GLUCOSE, CAPILLARY: GLUCOSE-CAPILLARY: 111 mg/dL — AB (ref 65–99)

## 2016-07-09 LAB — POCT PREGNANCY, URINE: Preg Test, Ur: NEGATIVE

## 2016-07-09 NOTE — ED Notes (Signed)
Pt reports that she "passed out" at 7pm - she states that she was walking back into work from break and then she woke up on the ground - pt denies hitting head and states she has no knots on her head and no pain in head - pt reports that she passed out before when she got to hot - pt denies N/V

## 2016-07-09 NOTE — ED Triage Notes (Addendum)
Pt to triage via w/c with no distress noted; pt reports syncopal episode PTA; c/o weakness at present with no other c/o voice, no recent illness; pt reports recently started back taking her psych meds, had been off of them for a month; st hx of seizures, but hadn't had one in awhile and not on any meds for such; pt calling boyfriend at home to verify her meds that she is taking; this nurse can hear boyfriend on phone stating "you don't even take these medicines, I swear to god I've told you if you don't take these medicines I'm gonna have you committed"; pt admits that she doesn't like to take her meds because they make her feel bad "like a zombie"; also reports has missed few doses of eloquis; reviewed chart and symptoms with Dr Darnelle Catalan and orders clarified

## 2016-07-09 NOTE — ED Provider Notes (Signed)
[facility] Emergency Department Provider Note   ____________________________________________   I have reviewed the triage vital signs and the nursing notes.   HISTORY  Chief Complaint Loss of Consciousness   History limited by: Not Limited   HPI Brianna Ashley is a 31 y.o. female who presents to the emergency department today after a syncopal episode. The patient was at work when this happened. Patient states she was walking when it occurred. Just previous to this she had started to feel hot. She denies any chest pain or palpitations. The patient does not remember passing out but does remember waking up on the floor. She denies any pain from that episode. She states she has passed out a couple times in the past but times right when she was feeling hot. Did have my examination she states she only feels slightly weak.   Past Medical History:  Diagnosis Date  . Asthma   . Carpal tunnel syndrome   . DVT of lower extremity (deep venous thrombosis) (HCC)   . Factor 5 Leiden mutation, heterozygous (HCC)   . Migraine   . Seizures [facility])     Patient Active Problem List   Diagnosis Date Noted  . Factor V Leiden (HCC) 03/28/2016  . Acute deep vein thrombosis (DVT) of femoral vein of left lower extremity (HCC) 03/28/2016  . Lupus anticoagulant positive 03/28/2016  . DVT (deep venous thrombosis) (HCC) 02/02/2016    Past Surgical History:  Procedure Laterality Date  . ABSCESS DRAINAGE    . CESAREAN SECTION    . DILATION AND CURETTAGE OF UTERUS      Prior to Admission medications   Medication Sig Start Date End Date Taking? Authorizing Provider  acetaminophen (TYLENOL) 325 MG tablet Take 2 tablets (650 mg total) by mouth every 6 (six) hours as needed for mild pain (or Fever >/= 101). 02/03/16   Ramonita Lab, MD  amoxicillin (AMOXIL) 500 MG capsule Take 1 capsule (500 mg total) by mouth 3 (three) times daily. 04/28/16   Enid Derry, PA-C  apixaban (ELIQUIS) 5  MG TABS tablet Take 2 tablets (10 mg total) by mouth 2 (two) times daily. 02/10/16   Ramonita Lab, MD  citalopram (CELEXA) 40 MG tablet Take 1 tablet (40 mg total) by mouth daily. 02/04/16   Ramonita Lab, MD  clonazePAM (KLONOPIN) 1 MG tablet Take 1 tablet (1 mg total) by mouth 3 (three) times daily. 02/03/16   Ramonita Lab, MD  cyclobenzaprine (FLEXERIL) 5 MG tablet Take 1 tablet (5 mg total) by mouth 3 (three) times daily as needed for muscle spasms. 05/15/16   Jenise V Bacon Menshew, PA-C  docusate sodium (COLACE) 100 MG capsule Take 1 capsule (100 mg total) by mouth 2 (two) times daily as needed for mild constipation. 02/03/16   Ramonita Lab, MD  oxyCODONE (OXY IR/ROXICODONE) 5 MG immediate release tablet Take 1 tablet (5 mg total) by mouth every 6 (six) hours as needed for moderate pain or breakthrough pain. 02/03/16   Ramonita Lab, MD  QUEtiapine (SEROQUEL) 25 MG tablet Take 1 tablet (25 mg total) by mouth 2 (two) times daily. 02/03/16   Ramonita Lab, MD  traMADol (ULTRAM) 50 MG tablet Take 1 tablet (50 mg total) by mouth 2 (two) times daily. 05/15/16   Charlesetta Ivory Menshew, PA-C    Allergies Ambien [zolpidem tartrate]; Ambien [zolpidem tartrate]; Clindamycin/lincomycin; Keflex [cephalexin]; Ritalin [methylphenidate hcl]; and Ritalin [methylphenidate hcl]  Family History  Problem Relation Age of Onset  . Factor V  Leiden deficiency Mother     deceased of drug overdose    Social History Social History  Substance Use Topics  . Smoking status: Current Every Day Smoker    Packs/day: 1.00    Years: 20.00    Types: Cigarettes  . Smokeless tobacco: Never Used  . Alcohol use No    Review of Systems  Constitutional: Negative for fever. Cardiovascular: Negative for chest pain. Respiratory: Negative for shortness of breath. Gastrointestinal: Negative for abdominal pain, vomiting and diarrhea. Neurological: Negative for headaches, focal weakness or numbness.  10-point ROS otherwise  negative.  ____________________________________________   PHYSICAL EXAM:  VITAL SIGNS: ED Triage Vitals  Enc Vitals Group     BP 07/09/16 2021 130/75     Pulse Rate 07/09/16 2021 82     Resp 07/09/16 2021 18     Temp 07/09/16 2021 98.3 F (36.8 C)     Temp Source 07/09/16 2021 Oral     SpO2 07/09/16 2021 97 %     Weight --      Height --      Head Circumference --      Peak Flow --      Pain Score 07/09/16 2140 0   Constitutional: Alert and oriented. Well appearing and in no distress. Eyes: Conjunctivae are normal. Normal extraocular movements. ENT   Head: Normocephalic and atraumatic.   Nose: No congestion/rhinnorhea.   Mouth/Throat: Mucous membranes are moist.   Neck: No stridor. Hematological/Lymphatic/Immunilogical: No cervical lymphadenopathy. Cardiovascular: Normal rate, regular rhythm.  No murmurs, rubs, or gallops.  Respiratory: Normal respiratory effort without tachypnea nor retractions. Breath sounds are clear and equal bilaterally. No wheezes/rales/rhonchi. Gastrointestinal: Soft and non tender. No rebound. No guarding.  Genitourinary: Deferred Musculoskeletal: Normal range of motion in all extremities. No lower extremity edema. Neurologic:  Normal speech and language. No gross focal neurologic deficits are appreciated.  Skin:  Skin is warm, dry and intact. No rash noted. Psychiatric: Mood and affect are normal. Speech and behavior are normal. Patient exhibits appropriate insight and judgment.  ____________________________________________    LABS (pertinent positives/negatives)  Labs Reviewed  BASIC METABOLIC PANEL - Abnormal; Notable for the following:       Result Value   Potassium 3.3 (*)    Glucose, Bld 106 (*)    All other components within normal limits  URINALYSIS, COMPLETE (UACMP) WITH MICROSCOPIC - Abnormal; Notable for the following:    Color, Urine COLORLESS (*)    APPearance CLEAR (*)    Specific Gravity, Urine 1.002 (*)     Squamous Epithelial / LPF 0-5 (*)    All other components within normal limits  TROPONIN I - Abnormal; Notable for the following:    Troponin I 0.03 (*)    All other components within normal limits  GLUCOSE, CAPILLARY - Abnormal; Notable for the following:    Glucose-Capillary 111 (*)    All other components within normal limits  CBC  TROPONIN I  CBG MONITORING, ED  POC URINE PREG, ED  POCT PREGNANCY, URINE    2nd trop pending  ____________________________________________   EKG  Lurline Idol, attending physician, personally viewed and interpreted this EKG  EKG Time: 2027 Rate: 76 Rhythm: normal sinus rhythm Axis: normal Intervals: qtc 429 QRS: narrow ST changes: no st elevation Impression: normal ekg   ____________________________________________    RADIOLOGY  None   ____________________________________________   PROCEDURES  Procedures  ____________________________________________   INITIAL IMPRESSION / ASSESSMENT AND PLAN / ED COURSE  Pertinent  labs & imaging results that were available during my care of the patient were reviewed by me and considered in my medical decision making (see chart for details).  Patient presented to the emergency department today because of concerns for a syncopal episode. Patient's troponin here was very minimally elevated at 0.03. Patient did not have any chest pain during this episode. However given abnormality will repeat. This point however I think likely vasovagal.  ____________________________________________   FINAL CLINICAL IMPRESSION(S) / ED DIAGNOSES  Final diagnoses:  Syncope, unspecified syncope type     Note: This dictation was prepared with Dragon dictation. Any transcriptional errors that result from this process are unintentional     Phineas Semen, MD 07/09/16 2226

## 2016-07-09 NOTE — Discharge Instructions (Signed)
Please seek medical attention for any high fevers, chest pain, shortness of breath, change in behavior, persistent vomiting, bloody stool or any other new or concerning symptoms.  

## 2016-07-10 LAB — TROPONIN I: TROPONIN I: 0.03 ng/mL — AB (ref <0.03)

## 2016-10-01 ENCOUNTER — Emergency Department
Admission: EM | Admit: 2016-10-01 | Discharge: 2016-10-01 | Disposition: A | Discharge status: Home / Self Care | Payer: Medicaid Other | Attending: Emergency Medicine | Admitting: Emergency Medicine

## 2016-10-01 ENCOUNTER — Encounter: Payer: Self-pay | Admitting: Emergency Medicine

## 2016-10-01 ENCOUNTER — Emergency Department: Payer: Medicaid Other

## 2016-10-01 DIAGNOSIS — J45909 Unspecified asthma, uncomplicated: Secondary | ICD-10-CM | POA: Diagnosis not present

## 2016-10-01 DIAGNOSIS — Y9301 Activity, walking, marching and hiking: Secondary | ICD-10-CM | POA: Insufficient documentation

## 2016-10-01 DIAGNOSIS — S93401A Sprain of unspecified ligament of right ankle, initial encounter: Secondary | ICD-10-CM | POA: Diagnosis not present

## 2016-10-01 DIAGNOSIS — Y999 Unspecified external cause status: Secondary | ICD-10-CM | POA: Insufficient documentation

## 2016-10-01 DIAGNOSIS — Z7901 Long term (current) use of anticoagulants: Secondary | ICD-10-CM | POA: Insufficient documentation

## 2016-10-01 DIAGNOSIS — Y929 Unspecified place or not applicable: Secondary | ICD-10-CM | POA: Insufficient documentation

## 2016-10-01 DIAGNOSIS — S99911A Unspecified injury of right ankle, initial encounter: Secondary | ICD-10-CM | POA: Diagnosis present

## 2016-10-01 DIAGNOSIS — F17210 Nicotine dependence, cigarettes, uncomplicated: Secondary | ICD-10-CM | POA: Diagnosis not present

## 2016-10-01 DIAGNOSIS — Z79899 Other long term (current) drug therapy: Secondary | ICD-10-CM | POA: Diagnosis not present

## 2016-10-01 DIAGNOSIS — W010XXA Fall on same level from slipping, tripping and stumbling without subsequent striking against object, initial encounter: Secondary | ICD-10-CM | POA: Diagnosis not present

## 2016-10-01 NOTE — Discharge Instructions (Signed)
Please seek medical attention for any high fevers, chest pain, shortness of breath, change in behavior, persistent vomiting, bloody stool or any other new or concerning symptoms.  

## 2016-10-01 NOTE — ED Provider Notes (Signed)
[facility] Emergency Department Provider Note   ____________________________________________   I have reviewed the triage vital signs and the nursing notes.   HISTORY  Chief Complaint Ankle Pain   History limited by: Not Limited   HPI Brianna Ashley is a 31 y.o. female who presents to the emergency department today because of concerns for right ankle pain. The pain started roughly 4 hours prior to my evaluation. The patient was walking when she stepped into a depression. She felt like her foot went sideways. She felt a pop and had immediate onset of severe pain. She was barely able to bear weight on the right foot when she would walk tiptoes. She denies any other injury. She denies hitting her head.    Past Medical History:  Diagnosis Date  . Asthma   . Carpal tunnel syndrome   . DVT of lower extremity (deep venous thrombosis) (HCC)   . Factor 5 Leiden mutation, heterozygous (HCC)   . Migraine   . Seizures [facility](HCC)     Patient Active Problem List   Diagnosis Date Noted  . Factor V Leiden (HCC) 03/28/2016  . Acute deep vein thrombosis (DVT) of femoral vein of left lower extremity (HCC) 03/28/2016  . Lupus anticoagulant positive 03/28/2016  . DVT (deep venous thrombosis) (HCC) 02/02/2016    Past Surgical History:  Procedure Laterality Date  . ABSCESS DRAINAGE    . CESAREAN SECTION    . DILATION AND CURETTAGE OF UTERUS      Prior to Admission medications   Medication Sig Start Date End Date Taking? Authorizing Provider  acetaminophen (TYLENOL) 325 MG tablet Take 2 tablets (650 mg total) by mouth every 6 (six) hours as needed for mild pain (or Fever >/= 101). 02/03/16   Ramonita LabGouru, Aruna, MD  amoxicillin (AMOXIL) 500 MG capsule Take 1 capsule (500 mg total) by mouth 3 (three) times daily. 04/28/16   Enid DerryWagner, Ashley, PA-C  apixaban (ELIQUIS) 5 MG TABS tablet Take 2 tablets (10 mg total) by mouth 2 (two) times daily. 02/10/16   Ramonita LabGouru, Aruna, MD  citalopram  (CELEXA) 40 MG tablet Take 1 tablet (40 mg total) by mouth daily. 02/04/16   Gouru, Deanna ArtisAruna, MD  clonazePAM (KLONOPIN) 1 MG tablet Take 1 tablet (1 mg total) by mouth 3 (three) times daily. 02/03/16   Ramonita LabGouru, Aruna, MD  cyclobenzaprine (FLEXERIL) 5 MG tablet Take 1 tablet (5 mg total) by mouth 3 (three) times daily as needed for muscle spasms. 05/15/16   Menshew, Charlesetta IvoryJenise V Bacon, PA-C  docusate sodium (COLACE) 100 MG capsule Take 1 capsule (100 mg total) by mouth 2 (two) times daily as needed for mild constipation. 02/03/16   Gouru, Deanna ArtisAruna, MD  oxyCODONE (OXY IR/ROXICODONE) 5 MG immediate release tablet Take 1 tablet (5 mg total) by mouth every 6 (six) hours as needed for moderate pain or breakthrough pain. 02/03/16   Gouru, Deanna ArtisAruna, MD  QUEtiapine (SEROQUEL) 25 MG tablet Take 1 tablet (25 mg total) by mouth 2 (two) times daily. 02/03/16   Gouru, Deanna ArtisAruna, MD  traMADol (ULTRAM) 50 MG tablet Take 1 tablet (50 mg total) by mouth 2 (two) times daily. 05/15/16   Menshew, Charlesetta IvoryJenise V Bacon, PA-C    Allergies Ambien [zolpidem tartrate]; Ambien [zolpidem tartrate]; Clindamycin/lincomycin; Keflex [cephalexin]; Ritalin [methylphenidate hcl]; and Ritalin [methylphenidate hcl]  Family History  Problem Relation Age of Onset  . Factor V Leiden deficiency Mother        deceased of drug overdose    Social History  Social History  Substance Use Topics  . Smoking status: Current Every Day Smoker    Packs/day: 1.00    Years: 20.00    Types: Cigarettes  . Smokeless tobacco: Never Used  . Alcohol use No    Review of Systems Constitutional: No fever/chills Eyes: No visual changes. ENT: No sore throat. Cardiovascular: Denies chest pain. Respiratory: Denies shortness of breath. Gastrointestinal: No abdominal pain.  No nausea, no vomiting.  No diarrhea.   Genitourinary: Negative for dysuria. Musculoskeletal: Positive for right ankle pain Skin: Negative for rash. Neurological: Negative for headaches, focal weakness or  numbness.  ____________________________________________   PHYSICAL EXAM:  VITAL SIGNS: ED Triage Vitals  Enc Vitals Group     BP 10/01/16 0340 124/63     Pulse Rate 10/01/16 0340 72     Resp 10/01/16 0340 20     Temp 10/01/16 0340 (!) 97.3 F (36.3 C)     Temp Source 10/01/16 0340 Oral     SpO2 10/01/16 0340 100 %     Weight 10/01/16 0340 227 lb (103 kg)     Height 10/01/16 0340 5\' 5"  (1.651 m)     Head Circumference --      Peak Flow --      Pain Score 10/01/16 0337 10   Constitutional: Alert and oriented. Well appearing and in no distress. Eyes: Conjunctivae are normal.  ENT   Head: Normocephalic and atraumatic.   Nose: No congestion/rhinnorhea.   Mouth/Throat: Mucous membranes are moist.   Neck: No stridor. Hematological/Lymphatic/Immunilogical: No cervical lymphadenopathy. Cardiovascular: Normal rate, regular rhythm.  No murmurs, rubs, or gallops.  Respiratory: Normal respiratory effort without tachypnea nor retractions. Breath sounds are clear and equal bilaterally. No wheezes/rales/rhonchi. Gastrointestinal: Soft and non tender. No rebound. No guarding.  Genitourinary: Deferred Musculoskeletal: Right ankle with some swelling. Tenderness to palpation and manipulation. Neurologic:  Normal speech and language. No gross focal neurologic deficits are appreciated.  Skin:  Skin is warm, dry and intact. No rash noted. Psychiatric: Mood and affect are normal. Speech and behavior are normal. Patient exhibits appropriate insight and judgment.  ____________________________________________    LABS (pertinent positives/negatives)  None  ____________________________________________   EKG  None  ____________________________________________    RADIOLOGY  Right ankle IMPRESSION:  Negative.     ____________________________________________   PROCEDURES  Procedures  ____________________________________________   INITIAL IMPRESSION / ASSESSMENT  AND PLAN / ED COURSE  Pertinent labs & imaging results that were available during my care of the patient were reviewed by me and considered in my medical decision making (see chart for details).  Patient presented to the emergency department today with concerns for right ankle pain. X-rays do not show any acute fracture. The patient likely sprained it. Will place patient in splint. Discussed rice and follow-up with orthopedics  ____________________________________________   FINAL CLINICAL IMPRESSION(S) / ED DIAGNOSES  Final diagnoses:  Sprain of right ankle, unspecified ligament, initial encounter     Note: This dictation was prepared with Dragon dictation. Any transcriptional errors that result from this process are unintentional     Phineas Semen, MD 10/01/16 [phone]

## 2016-10-14 ENCOUNTER — Encounter: Payer: Self-pay | Admitting: *Deleted

## 2016-10-14 ENCOUNTER — Emergency Department
Admission: EM | Admit: 2016-10-14 | Discharge: 2016-10-14 | Disposition: A | Discharge status: Home / Self Care | Payer: Medicaid Other | Attending: Emergency Medicine | Admitting: Emergency Medicine

## 2016-10-14 DIAGNOSIS — F17210 Nicotine dependence, cigarettes, uncomplicated: Secondary | ICD-10-CM | POA: Insufficient documentation

## 2016-10-14 DIAGNOSIS — J45909 Unspecified asthma, uncomplicated: Secondary | ICD-10-CM | POA: Insufficient documentation

## 2016-10-14 DIAGNOSIS — N760 Acute vaginitis: Secondary | ICD-10-CM | POA: Diagnosis not present

## 2016-10-14 DIAGNOSIS — Z79899 Other long term (current) drug therapy: Secondary | ICD-10-CM | POA: Insufficient documentation

## 2016-10-14 DIAGNOSIS — Z7901 Long term (current) use of anticoagulants: Secondary | ICD-10-CM | POA: Insufficient documentation

## 2016-10-14 DIAGNOSIS — Z202 Contact with and (suspected) exposure to infections with a predominantly sexual mode of transmission: Secondary | ICD-10-CM | POA: Diagnosis present

## 2016-10-14 DIAGNOSIS — B9689 Other specified bacterial agents as the cause of diseases classified elsewhere: Secondary | ICD-10-CM

## 2016-10-14 LAB — WET PREP, GENITAL
Sperm: NONE SEEN
Trich, Wet Prep: NONE SEEN
Yeast Wet Prep HPF POC: NONE SEEN

## 2016-10-14 LAB — CHLAMYDIA/NGC RT PCR (ARMC ONLY)
CHLAMYDIA TR: NOT DETECTED
N GONORRHOEAE: NOT DETECTED

## 2016-10-14 LAB — POC URINE PREG, ED: PREG TEST UR: NEGATIVE

## 2016-10-14 LAB — MONONUCLEOSIS SCREEN: MONO SCREEN: NEGATIVE

## 2016-10-14 MED ORDER — METRONIDAZOLE 500 MG PO TABS: 500.0000 mg | ORAL_TABLET | Freq: Two times a day (BID) | ORAL | 0 refills | Status: DC

## 2016-10-14 MED ORDER — FLUCONAZOLE 150 MG PO TABS: 150.0000 mg | ORAL_TABLET | Freq: Once | ORAL | 0 refills | Status: AC

## 2016-10-14 NOTE — ED Notes (Signed)
See triage note.states she was told by partner that she needed to be tested  Denies any vaginal discharge  And also unsure what he was treated for

## 2016-10-14 NOTE — ED Provider Notes (Signed)
[facility] Emergency Department Provider Note  ____________________________________________  Time seen: Approximately 4:27 PM  I have reviewed the triage vital signs and the nursing notes.   HISTORY  Chief Complaint Exposure to STD    HPI Brianna Ashley is a 31 y.o. female who presents emergency Department with multiple medical complaints. Patient reports that this morning a previous sexual partner techs at her to inform her that she needed STD testing. Patient is unsure what STD her sexual partner had and was treated for. Patient is currently denying any symptoms of abdominal pain, suprapubic pain, dysuria, polyuria, vaginal discharge, vaginal bleeding, skin lesions.  Patient is also requesting to be checked for mono. She reports that a friend that she shared a glass with, was positive for monitoring this week. Patient denies any fatigue, malaise, myalgias, arthralgias, sore throat. No history of mono in the past.   Past Medical History:  Diagnosis Date  . Asthma   . Carpal tunnel syndrome   . DVT of lower extremity (deep venous thrombosis) (HCC)   . Factor 5 Leiden mutation, heterozygous (HCC)   . Migraine   . Seizures [facility])     Patient Active Problem List   Diagnosis Date Noted  . Factor V Leiden (HCC) 03/28/2016  . Acute deep vein thrombosis (DVT) of femoral vein of left lower extremity (HCC) 03/28/2016  . Lupus anticoagulant positive 03/28/2016  . DVT (deep venous thrombosis) (HCC) 02/02/2016    Past Surgical History:  Procedure Laterality Date  . ABSCESS DRAINAGE    . CESAREAN SECTION    . DILATION AND CURETTAGE OF UTERUS      Prior to Admission medications   Medication Sig Start Date End Date Taking? Authorizing Provider  acetaminophen (TYLENOL) 325 MG tablet Take 2 tablets (650 mg total) by mouth every 6 (six) hours as needed for mild pain (or Fever >/= 101). 02/03/16   Ramonita Lab, MD  amoxicillin (AMOXIL) 500 MG capsule Take 1  capsule (500 mg total) by mouth 3 (three) times daily. 04/28/16   Enid Derry, PA-C  apixaban (ELIQUIS) 5 MG TABS tablet Take 2 tablets (10 mg total) by mouth 2 (two) times daily. 02/10/16   Ramonita Lab, MD  citalopram (CELEXA) 40 MG tablet Take 1 tablet (40 mg total) by mouth daily. 02/04/16   Gouru, Deanna Artis, MD  clonazePAM (KLONOPIN) 1 MG tablet Take 1 tablet (1 mg total) by mouth 3 (three) times daily. 02/03/16   Ramonita Lab, MD  cyclobenzaprine (FLEXERIL) 5 MG tablet Take 1 tablet (5 mg total) by mouth 3 (three) times daily as needed for muscle spasms. 05/15/16   Menshew, Charlesetta Ivory, PA-C  docusate sodium (COLACE) 100 MG capsule Take 1 capsule (100 mg total) by mouth 2 (two) times daily as needed for mild constipation. 02/03/16   Ramonita Lab, MD  fluconazole (DIFLUCAN) 150 MG tablet Take 1 tablet (150 mg total) by mouth once. 10/14/16 10/14/16  Cuthriell, Delorise Royals, PA-C  metroNIDAZOLE (FLAGYL) 500 MG tablet Take 1 tablet (500 mg total) by mouth 2 (two) times daily. 10/14/16   Cuthriell, Delorise Royals, PA-C  oxyCODONE (OXY IR/ROXICODONE) 5 MG immediate release tablet Take 1 tablet (5 mg total) by mouth every 6 (six) hours as needed for moderate pain or breakthrough pain. 02/03/16   Gouru, Deanna Artis, MD  QUEtiapine (SEROQUEL) 25 MG tablet Take 1 tablet (25 mg total) by mouth 2 (two) times daily. 02/03/16   Ramonita Lab, MD  traMADol (ULTRAM) 50 MG tablet Take 1  tablet (50 mg total) by mouth 2 (two) times daily. 05/15/16   Menshew, Charlesetta IvoryJenise V Bacon, PA-C    Allergies Ambien [zolpidem tartrate]; Ambien [zolpidem tartrate]; Clindamycin/lincomycin; Keflex [cephalexin]; Ritalin [methylphenidate hcl]; and Ritalin [methylphenidate hcl]  Family History  Problem Relation Age of Onset  . Factor V Leiden deficiency Mother        deceased of drug overdose    Social History Social History  Substance Use Topics  . Smoking status: Current Every Day Smoker    Packs/day: 1.00    Years: 20.00    Types:  Cigarettes  . Smokeless tobacco: Never Used  . Alcohol use No     Review of Systems  Constitutional: No fever/chills Eyes: No visual changes. No discharge ENT: No upper respiratory complaints. Cardiovascular: no chest pain. Respiratory: no cough. No SOB. Gastrointestinal: No abdominal pain.  No nausea, no vomiting.  No diarrhea.  No constipation. Genitourinary: Negative for dysuria. No hematuria. No vaginal bleeding. No vaginal discharge. No lesions. Musculoskeletal: Negative for musculoskeletal pain. Skin: Negative for rash, abrasions, lacerations, ecchymosis. Neurological: Negative for headaches, focal weakness or numbness. 10-point ROS otherwise negative.  ____________________________________________   PHYSICAL EXAM:  VITAL SIGNS: ED Triage Vitals  Enc Vitals Group     BP 10/14/16 1551 125/61     Pulse Rate 10/14/16 1551 81     Resp 10/14/16 1551 16     Temp 10/14/16 1551 97.9 F (36.6 C)     Temp Source 10/14/16 1551 Oral     SpO2 10/14/16 1551 100 %     Weight 10/14/16 1552 231 lb 9 oz (105 kg)     Height 10/14/16 1552 5\' 5"  (1.651 m)     Head Circumference --      Peak Flow --      Pain Score --      Pain Loc --      Pain Edu? --      Excl. in GC? --      Constitutional: Alert and oriented. Well appearing and in no acute distress. Eyes: Conjunctivae are normal. PERRL. EOMI. Head: Atraumatic. ENT:      Ears:       Nose: No congestion/rhinnorhea.      Mouth/Throat: Mucous membranes are moist. Oropharynx was unremarkable with no gross erythema or edema. Uvula is midline. Neck: No stridor. Neck is supple with full range of motion Hematological/Lymphatic/Immunilogical: No cervical lymphadenopathy. Cardiovascular: Normal rate, regular rhythm. Normal S1 and S2.  Good peripheral circulation. Respiratory: Normal respiratory effort without tachypnea or retractions. Lungs CTAB. Good air entry to the bases with no decreased or absent breath sounds. Gastrointestinal:  Bowel sounds 4 quadrants. Soft and nontender to palpation. No guarding or rigidity. No palpable masses. No distention. No CVA tenderness. Genitourinary: No external lesions are appreciated. No visible external drainage. Pelvic exam reveals no significant discharge. Slight fishy odor is appreciated. Cervix is unremarkable. No vaginal lacerations or abrasions appreciated. Bimanual exam reveals no cervical motion tenderness. No adnexal masses. Musculoskeletal: Full range of motion to all extremities. No gross deformities appreciated. Neurologic:  Normal speech and language. No gross focal neurologic deficits are appreciated.  Skin:  Skin is warm, dry and intact. No rash noted. Psychiatric: Mood and affect are normal. Speech and behavior are normal. Patient exhibits appropriate insight and judgement.  Pelvic exam is chaperoned by female RN ____________________________________________   LABS (all labs ordered are listed, but only abnormal results are displayed)  Labs Reviewed  WET PREP, GENITAL - Abnormal; Notable  for the following:       Result Value   Clue Cells Wet Prep HPF POC PRESENT (*)    WBC, Wet Prep HPF POC FEW (*)    All other components within normal limits  CHLAMYDIA/NGC RT PCR ([facility] ONLY)  MONONUCLEOSIS SCREEN  POC URINE PREG, ED   ____________________________________________  EKG   ____________________________________________  RADIOLOGY   No results found.  ____________________________________________    PROCEDURES  Procedure(s) performed:    Procedures    Medications - No data to display   ____________________________________________   INITIAL IMPRESSION / ASSESSMENT AND PLAN / ED COURSE  Pertinent labs & imaging results that were available during my care of the patient were reviewed by me and considered in my medical decision making (see chart for details).  Review of the [place] CSRS was performed in accordance of the [facility] prior to  dispensing any controlled drugs.     Patient's diagnosis is consistent with Bacterial vaginosis. Patient presented after possible exposure to STD from unknown STD. At this time, testing for gonorrhea, chlamydia, trichomoniasis was undertaken. Patient also is concerned she may have been exposed to mono. STD testing returns with no acute findings. Mono test is negative. Patient does have a finding of BV from what prep. Patient will be treated with metronidazole for same. Patient is encouraged to follow up with health department for complete STD testing.. Patient will be discharged home with prescriptions for Flagyl and Diflucan. Patient is to follow up with health department as needed or otherwise directed. Patient is given ED precautions to return to the ED for any worsening or new symptoms.     ____________________________________________  FINAL CLINICAL IMPRESSION(S) / ED DIAGNOSES  Final diagnoses:  STD exposure  BV (bacterial vaginosis)      NEW MEDICATIONS STARTED DURING THIS VISIT:  Discharge Medication List as of 10/14/2016  8:14 PM    START taking these medications   Details  fluconazole (DIFLUCAN) 150 MG tablet Take 1 tablet (150 mg total) by mouth once., Starting Tue 10/14/2016, Print    metroNIDAZOLE (FLAGYL) 500 MG tablet Take 1 tablet (500 mg total) by mouth 2 (two) times daily., Starting Tue 10/14/2016, Print            This chart was dictated using voice recognition software/Dragon. Despite best efforts to proofread, errors can occur which can change the meaning. Any change was purely unintentional.    Racheal Patches, PA-C 10/14/16 2328    Governor Rooks, MD 10/20/16 [phone]

## 2016-10-14 NOTE — ED Notes (Signed)
Resting at present  Awaiting lab work 

## 2016-10-14 NOTE — ED Triage Notes (Signed)
Pt reports significant other told her she needed to be checked for STDs. Pt denies vaginal pain or discharge but reports having had diarrhea.No abd pain and no NV. Pt also requested to have a pregnancy test performed.

## 2016-11-02 ENCOUNTER — Emergency Department: Payer: Medicaid Other

## 2016-11-02 ENCOUNTER — Emergency Department
Admission: EM | Admit: 2016-11-02 | Discharge: 2016-11-02 | Disposition: A | Discharge status: Home / Self Care | Payer: Medicaid Other | Attending: Student in an Organized Health Care Education/Training Program | Admitting: Student in an Organized Health Care Education/Training Program

## 2016-11-02 DIAGNOSIS — F17210 Nicotine dependence, cigarettes, uncomplicated: Secondary | ICD-10-CM | POA: Diagnosis not present

## 2016-11-02 DIAGNOSIS — Z79899 Other long term (current) drug therapy: Secondary | ICD-10-CM | POA: Insufficient documentation

## 2016-11-02 DIAGNOSIS — Z7901 Long term (current) use of anticoagulants: Secondary | ICD-10-CM | POA: Diagnosis not present

## 2016-11-02 DIAGNOSIS — J45909 Unspecified asthma, uncomplicated: Secondary | ICD-10-CM | POA: Insufficient documentation

## 2016-11-02 DIAGNOSIS — R202 Paresthesia of skin: Secondary | ICD-10-CM | POA: Diagnosis not present

## 2016-11-02 DIAGNOSIS — R079 Chest pain, unspecified: Secondary | ICD-10-CM | POA: Insufficient documentation

## 2016-11-02 LAB — TROPONIN I: Troponin I: <0.03 ng/mL (ref <0.03)

## 2016-11-02 LAB — CBC
HEMATOCRIT: 40.2 % (ref 35.0–47.0)
Hemoglobin: 13.9 g/dL (ref 12.0–16.0)
MCH: 31.4 pg (ref 26.0–34.0)
MCHC: 34.6 g/dL (ref 32.0–36.0)
MCV: 90.6 fL (ref 80.0–100.0)
PLATELETS: 273 10*3/uL (ref 150–440)
RBC: 4.44 MIL/uL (ref 3.80–5.20)
RDW: 14.8 % — AB (ref 11.5–14.5)
WBC: 10.7 10*3/uL (ref 3.6–11.0)

## 2016-11-02 LAB — BASIC METABOLIC PANEL
ANION GAP: 9 (ref 5–15)
BUN: 8 mg/dL (ref 6–20)
CO2: 20 mmol/L — AB (ref 22–32)
CREATININE: 0.9 mg/dL (ref 0.44–1.00)
Calcium: 9.1 mg/dL (ref 8.9–10.3)
Chloride: 108 mmol/L (ref 101–111)
GFR calc Af Amer: >60 mL/min (ref >60)
GFR calc non Af Amer: >60 mL/min (ref >60)
GLUCOSE: 91 mg/dL (ref 65–99)
POTASSIUM: 3.8 mmol/L (ref 3.5–5.1)
Sodium: 137 mmol/L (ref 135–145)

## 2016-11-02 MED ORDER — APIXABAN 5 MG PO TABS
5.0000 mg | ORAL_TABLET | Freq: Two times a day (BID) | ORAL | Status: DC
  Administered 2016-11-02: 5 mg via ORAL
  Filled 2016-11-02: qty 1

## 2016-11-02 MED ORDER — LORAZEPAM 1 MG PO TABS
1.0000 mg | ORAL_TABLET | Freq: Once | ORAL | Status: AC
  Administered 2016-11-02: 1 mg via ORAL
  Filled 2016-11-02: qty 1

## 2016-11-02 MED ORDER — ALBUTEROL SULFATE HFA 108 (90 BASE) MCG/ACT IN AERS: 2.0000 | INHALATION_SPRAY | Freq: Four times a day (QID) | RESPIRATORY_TRACT | 2 refills | Status: DC | PRN

## 2016-11-02 NOTE — ED Notes (Signed)
Pt taken to xray via wheelchair

## 2016-11-02 NOTE — Discharge Instructions (Signed)
Please follow up with PCP.  Be sure to remember to take your medications.  Return for worsening pain, shortness of breath, fevers, numbness or tingling or if you change your mind about neuroimaging.

## 2016-11-02 NOTE — ED Provider Notes (Signed)
[facility] Emergency Department Provider Note    First MD Initiated Contact with Patient 11/02/16 2231     (approximate)  I have reviewed the triage vital signs and the nursing notes.   HISTORY  Chief Complaint Chest Pain and Numbness    HPI Brianna Ashley is a 31 y.o. female presents with chief complaint of achiness in her left arm associated with numbness and tingling in her left arm and shortness of breath. Patient with history of DVT and 5 Leiden on eliquis. States that she's been working extended hours and states that she has missed one or 2 doses of her eliquist. She denies any fevers. No chest discomfort at this time. States that she did briefly have roughly 10 minutes of chest pain associated with the left arm achiness earlier in the day. States that she's been having to lift heavy objects at work and [employer] and going in and out of the locked box and safe. Patient states that she's also been increasingly stressed out because they're asking her to work even more stress. She denies any headache. No associated numbness or tingling. No abdominal pain. No lower extremity swelling. She is able to walk to and from work without any worsening chest pain diaphoresis or shortness of breath.   Past Medical History:  Diagnosis Date  . Asthma   . Carpal tunnel syndrome   . DVT of lower extremity (deep venous thrombosis) (HCC)   . Factor 5 Leiden mutation, heterozygous (HCC)   . Migraine   . Seizures (HCC)    Family History  Problem Relation Age of Onset  . Factor V Leiden deficiency Mother        deceased of drug overdose   Past Surgical History:  Procedure Laterality Date  . ABSCESS DRAINAGE    . CESAREAN SECTION    . DILATION AND CURETTAGE OF UTERUS     Patient Active Problem List   Diagnosis Date Noted  . Factor V Leiden (HCC) 03/28/2016  . Acute deep vein thrombosis (DVT) of femoral vein of left lower extremity (HCC) 03/28/2016  .  Lupus anticoagulant positive 03/28/2016  . DVT (deep venous thrombosis) (HCC) 02/02/2016      Prior to Admission medications   Medication Sig Start Date End Date Taking? Authorizing Provider  acetaminophen (TYLENOL) 325 MG tablet Take 2 tablets (650 mg total) by mouth every 6 (six) hours as needed for mild pain (or Fever >/= 101). 02/03/16   Gouru, Deanna Artis, MD  albuterol (PROVENTIL HFA;VENTOLIN HFA) 108 (90 Base) MCG/ACT inhaler Inhale 2 puffs into the lungs every 6 (six) hours as needed for wheezing or shortness of breath. 11/02/16   Willy Eddy, MD  amoxicillin (AMOXIL) 500 MG capsule Take 1 capsule (500 mg total) by mouth 3 (three) times daily. 04/28/16   Enid Derry, PA-C  apixaban (ELIQUIS) 5 MG TABS tablet Take 2 tablets (10 mg total) by mouth 2 (two) times daily. 02/10/16   Ramonita Lab, MD  citalopram (CELEXA) 40 MG tablet Take 1 tablet (40 mg total) by mouth daily. 02/04/16   Gouru, Deanna Artis, MD  clonazePAM (KLONOPIN) 1 MG tablet Take 1 tablet (1 mg total) by mouth 3 (three) times daily. 02/03/16   Ramonita Lab, MD  cyclobenzaprine (FLEXERIL) 5 MG tablet Take 1 tablet (5 mg total) by mouth 3 (three) times daily as needed for muscle spasms. 05/15/16   Menshew, Charlesetta Ivory, PA-C  docusate sodium (COLACE) 100 MG capsule Take 1 capsule (100 mg  total) by mouth 2 (two) times daily as needed for mild constipation. 02/03/16   Ramonita LabGouru, Aruna, MD  metroNIDAZOLE (FLAGYL) 500 MG tablet Take 1 tablet (500 mg total) by mouth 2 (two) times daily. 10/14/16   Cuthriell, Delorise RoyalsJonathan D, PA-C  oxyCODONE (OXY IR/ROXICODONE) 5 MG immediate release tablet Take 1 tablet (5 mg total) by mouth every 6 (six) hours as needed for moderate pain or breakthrough pain. 02/03/16   Gouru, Deanna ArtisAruna, MD  QUEtiapine (SEROQUEL) 25 MG tablet Take 1 tablet (25 mg total) by mouth 2 (two) times daily. 02/03/16   Gouru, Deanna ArtisAruna, MD  traMADol (ULTRAM) 50 MG tablet Take 1 tablet (50 mg total) by mouth 2 (two) times daily. 05/15/16   Menshew,  Charlesetta IvoryJenise V Bacon, PA-C    Allergies Ambien [zolpidem tartrate]; Ambien [zolpidem tartrate]; Clindamycin/lincomycin; Keflex [cephalexin]; Ritalin [methylphenidate hcl]; and Ritalin [methylphenidate hcl]    Social History Social History  Substance Use Topics  . Smoking status: Current Every Day Smoker    Packs/day: 1.00    Years: 20.00    Types: Cigarettes  . Smokeless tobacco: Never Used  . Alcohol use No    Review of Systems Patient denies headaches, rhinorrhea, blurry vision, numbness, shortness of breath, chest pain, edema, cough, abdominal pain, nausea, vomiting, diarrhea, dysuria, fevers, rashes or hallucinations unless otherwise stated above in HPI. ____________________________________________   PHYSICAL EXAM:  VITAL SIGNS: Vitals:   11/02/16 2049 11/02/16 2324  BP: 127/89 117/64  Pulse: (!) 8 71  Resp: 16 18  Temp: 98.1 F (36.7 C)   SpO2: 98% 98%    Constitutional: Alert and oriented. Well appearing and in no acute distress. Eyes: Conjunctivae are normal.  Head: Atraumatic. Nose: No congestion/rhinnorhea. Mouth/Throat: Mucous membranes are moist.   Neck: No stridor. Painless ROM.  Cardiovascular: Normal rate, regular rhythm. Grossly normal heart sounds.  Good peripheral circulation. Respiratory: Normal respiratory effort.  No retractions. Lungs with good air movement throughout Gastrointestinal: Soft and nontender. No distention. No abdominal bruits. No CVA tenderness. Musculoskeletal: No lower extremity tenderness nor edema.  No joint effusions. Neurologic:  CN- intact.  No facial droop, Normal FNF.  Normal heel to shin.  Sensation intact bilaterally. Normal speech and language. No gross focal neurologic deficits are appreciated. No gait instability. Skin:  Skin is warm, dry and intact. No rash noted. Psychiatric: Mood and affect are normal. Speech and behavior are normal.  ____________________________________________   LABS (all labs ordered are listed,  but only abnormal results are displayed)  Results for orders placed or performed during the hospital encounter of 11/02/16 (from the past 24 hour(s))  Basic metabolic panel     Status: Abnormal   Collection Time: 11/02/16  8:54 PM  Result Value Ref Range   Sodium 137 135 - 145 mmol/L   Potassium 3.8 3.5 - 5.1 mmol/L   Chloride 108 101 - 111 mmol/L   CO2 20 (L) 22 - 32 mmol/L   Glucose, Bld 91 65 - 99 mg/dL   BUN 8 6 - 20 mg/dL   Creatinine, Ser 1.610.90 0.44 - 1.00 mg/dL   Calcium 9.1 8.9 - 09.610.3 mg/dL   GFR calc non Af Amer >60 >60 mL/min   GFR calc Af Amer >60 >60 mL/min   Anion gap 9 5 - 15  CBC     Status: Abnormal   Collection Time: 11/02/16  8:54 PM  Result Value Ref Range   WBC 10.7 3.6 - 11.0 K/uL   RBC 4.44 3.80 - 5.20 MIL/uL  Hemoglobin 13.9 12.0 - 16.0 g/dL   HCT 16.1 09.6 - 04.5 %   MCV 90.6 80.0 - 100.0 fL   MCH 31.4 26.0 - 34.0 pg   MCHC 34.6 32.0 - 36.0 g/dL   RDW 40.9 (H) 81.1 - 91.4 %   Platelets 273 150 - 440 K/uL  Troponin I     Status: None   Collection Time: 11/02/16  8:54 PM  Result Value Ref Range   Troponin I <0.03 <0.03 ng/mL   ____________________________________________  EKG My review and personal interpretation at Time: 20:46   Indication: tingling  Rate: 75  Rhythm: sinus Axis: normal Other: normalintervals, no stemi, depressions, no pr depressions ____________________________________________  RADIOLOGY  I personally reviewed all radiographic images ordered to evaluate for the above acute complaints and reviewed radiology reports and findings.  These findings were personally discussed with the patient.  Please see medical record for radiology report.  ____________________________________________   PROCEDURES  Procedure(s) performed:  Procedures    Critical Care performed: no ____________________________________________   INITIAL IMPRESSION / ASSESSMENT AND PLAN / ED COURSE  Pertinent labs & imaging results that were available  during my care of the patient were reviewed by me and considered in my medical decision making (see chart for details).  DDX: ACS, pericarditis, esophagitis, boerhaaves, pe, dissection, pna, bronchitis, costochondritis   Tatijana S Sponsel is a 31 y.o. who presents to the ED with symptoms as described above. She is currently afebrile and well-appearing. Patient does have complex past medical history but is currently well-appearing and in no acute distress. Workup here in the ER is reassuring for the above differential. She has no evidence of infectious process.  She has a Heart score of 1 with a normal-appearing EKG. Patient does have a history of DVT but is on eliquis has no tachycardia or hypoxia to suggest submassive or massive PE. We'll give dose of her anticoagulant this evening and she's missed her evening dose. Patient states to be very stressed out by work at this time is requesting something for anxiety. She has no focal neuro deficits to suggest CVA. I have discussed the need for MRI versus CT scan to further evaluate for any evidence of CNS disease based on her lack of symptoms at this moment the patient has declined this as she states that she does not really want to remove her nipple piercings that she had this week. She does have a lack of symptoms at this time I do believe that that is a reasonable approach given her age. This Friday for the patient is stable for follow-up with her PCP.      ____________________________________________   FINAL CLINICAL IMPRESSION(S) / ED DIAGNOSES  Final diagnoses:  Paresthesia  Chest pain, unspecified type      NEW MEDICATIONS STARTED DURING THIS VISIT:  Discharge Medication List as of 11/02/2016 10:54 PM    START taking these medications   Details  albuterol (PROVENTIL HFA;VENTOLIN HFA) 108 (90 Base) MCG/ACT inhaler Inhale 2 puffs into the lungs every 6 (six) hours as needed for wheezing or shortness of breath., Starting Sun 11/02/2016,  Print         Note:  This document was prepared using Dragon voice recognition software and may include unintentional dictation errors.    Willy Eddy, MD 11/02/16 [phone]

## 2016-11-02 NOTE — ED Triage Notes (Signed)
Pt states she had left arm numbness approx 2 hours ago while at work. Pt states numbness lasted ap prox 15 minutes. Pt states also has mid back pain, nausea, vomiting, chest pain and shortness of breath. Pt is talking on phone rapidly in triage and appears in no acute distress.

## 2017-02-03 ENCOUNTER — Encounter: Payer: Self-pay | Admitting: Intensive Care

## 2017-02-03 ENCOUNTER — Emergency Department: Payer: Medicaid Other

## 2017-02-03 ENCOUNTER — Emergency Department
Admission: EM | Admit: 2017-02-03 | Discharge: 2017-02-03 | Disposition: A | Discharge status: Home / Self Care | Payer: Medicaid Other | Attending: Emergency Medicine | Admitting: Emergency Medicine

## 2017-02-03 DIAGNOSIS — R0789 Other chest pain: Secondary | ICD-10-CM | POA: Diagnosis not present

## 2017-02-03 DIAGNOSIS — F17210 Nicotine dependence, cigarettes, uncomplicated: Secondary | ICD-10-CM | POA: Insufficient documentation

## 2017-02-03 DIAGNOSIS — Z7901 Long term (current) use of anticoagulants: Secondary | ICD-10-CM | POA: Insufficient documentation

## 2017-02-03 DIAGNOSIS — Z79899 Other long term (current) drug therapy: Secondary | ICD-10-CM | POA: Diagnosis not present

## 2017-02-03 DIAGNOSIS — J45909 Unspecified asthma, uncomplicated: Secondary | ICD-10-CM | POA: Insufficient documentation

## 2017-02-03 DIAGNOSIS — R05 Cough: Secondary | ICD-10-CM | POA: Insufficient documentation

## 2017-02-03 DIAGNOSIS — J029 Acute pharyngitis, unspecified: Secondary | ICD-10-CM | POA: Diagnosis present

## 2017-02-03 DIAGNOSIS — J069 Acute upper respiratory infection, unspecified: Secondary | ICD-10-CM | POA: Insufficient documentation

## 2017-02-03 LAB — POCT RAPID STREP A: STREPTOCOCCUS, GROUP A SCREEN (DIRECT): NEGATIVE

## 2017-02-03 MED ORDER — BENZONATATE 100 MG PO CAPS: ORAL_CAPSULE | ORAL | 0 refills | Status: DC

## 2017-02-03 MED ORDER — ACETAMINOPHEN-CODEINE #3 300-30 MG PO TABS
1.0000 | ORAL_TABLET | Freq: Once | ORAL | Status: AC
  Administered 2017-02-03: 1 via ORAL
  Filled 2017-02-03: qty 1

## 2017-02-03 MED ORDER — IPRATROPIUM-ALBUTEROL 0.5-2.5 (3) MG/3ML IN SOLN: 3.0000 mL | Freq: Once | RESPIRATORY_TRACT | Status: DC

## 2017-02-03 MED ORDER — ACETAMINOPHEN-CODEINE #3 300-30 MG PO TABS: 1.0000 | ORAL_TABLET | Freq: Four times a day (QID) | ORAL | 0 refills | Status: DC | PRN

## 2017-02-03 MED ORDER — PREDNISONE 10 MG (21) PO TBPK: ORAL_TABLET | ORAL | 0 refills | Status: DC

## 2017-02-03 MED ORDER — ONDANSETRON 4 MG PO TBDP
4.0000 mg | ORAL_TABLET | Freq: Once | ORAL | Status: AC
  Administered 2017-02-03: 4 mg via ORAL
  Filled 2017-02-03: qty 1

## 2017-02-03 MED ORDER — ALBUTEROL SULFATE HFA 108 (90 BASE) MCG/ACT IN AERS: 2.0000 | INHALATION_SPRAY | Freq: Four times a day (QID) | RESPIRATORY_TRACT | 0 refills | Status: DC | PRN

## 2017-02-03 MED ORDER — FLUTICASONE PROPIONATE 50 MCG/ACT NA SUSP: 2.0000 | Freq: Every day | NASAL | 0 refills | Status: DC

## 2017-02-03 NOTE — Discharge Instructions (Signed)
Your exam and x-ray are consistent with a viral infection. Take the prescription meds as directed. Follow-up with your provider for continued symptoms.

## 2017-02-03 NOTE — ED Notes (Signed)
Pt verbalizes understanding of d/c instructions, medications and follow up 

## 2017-02-03 NOTE — ED Triage Notes (Signed)
Patient c/o sore throat and cough X3 days. Ambulatory in triage with no problems. No respiratory distress noted

## 2017-02-03 NOTE — ED Provider Notes (Signed)
[facility] Emergency Department Provider Note ____________________________________________  Time seen: 1153  I have reviewed the triage vital signs and the nursing notes.  HISTORY  Chief Complaint  Sore Throat  HPI Brianna Ashley is a 31 y.o. female presents to the ED with evaluation of sore throat and cough for 3 days.  Patient denies any shortness of breath, chest pain, or cough induced vomiting.  She also denies any recent travel, or sick contacts.  She describes chest pain related to the cough but denies any frank fevers.  She has taken Tylenol and Motrin without any significant benefit.  Past Medical History:  Diagnosis Date  . Asthma   . Carpal tunnel syndrome   . DVT of lower extremity (deep venous thrombosis) (HCC)   . Factor 5 Leiden mutation, heterozygous (HCC)   . Migraine   . Seizures [facility])     Patient Active Problem List   Diagnosis Date Noted  . Factor V Leiden (HCC) 03/28/2016  . Acute deep vein thrombosis (DVT) of femoral vein of left lower extremity (HCC) 03/28/2016  . Lupus anticoagulant positive 03/28/2016  . DVT (deep venous thrombosis) (HCC) 02/02/2016    Past Surgical History:  Procedure Laterality Date  . ABSCESS DRAINAGE    . CESAREAN SECTION    . DILATION AND CURETTAGE OF UTERUS      Prior to Admission medications   Medication Sig Start Date End Date Taking? Authorizing Provider  acetaminophen (TYLENOL) 325 MG tablet Take 2 tablets (650 mg total) by mouth every 6 (six) hours as needed for mild pain (or Fever >/= 101). 02/03/16   Gouru, Deanna Artis, MD  acetaminophen-codeine (TYLENOL #3) 300-30 MG tablet Take 1 tablet every 6 (six) hours as needed by mouth for moderate pain. 02/03/17   Davon Abdelaziz, Charlesetta Ivory, PA-C  albuterol (PROVENTIL HFA;VENTOLIN HFA) 108 (90 Base) MCG/ACT inhaler Inhale 2 puffs into the lungs every 6 (six) hours as needed for wheezing or shortness of breath. 11/02/16   Willy Eddy, MD  albuterol  (PROVENTIL HFA;VENTOLIN HFA) 108 (90 Base) MCG/ACT inhaler Inhale 2 puffs every 6 (six) hours as needed into the lungs for wheezing or shortness of breath. 02/03/17   Bodi Palmeri, Charlesetta Ivory, PA-C  apixaban (ELIQUIS) 5 MG TABS tablet Take 2 tablets (10 mg total) by mouth 2 (two) times daily. 02/10/16   Ramonita Lab, MD  benzonatate (TESSALON PERLES) 100 MG capsule Take 1-2 tabs TID prn cough 02/03/17   Lovinia Snare, Charlesetta Ivory, PA-C  citalopram (CELEXA) 40 MG tablet Take 1 tablet (40 mg total) by mouth daily. 02/04/16   Gouru, Deanna Artis, MD  clonazePAM (KLONOPIN) 1 MG tablet Take 1 tablet (1 mg total) by mouth 3 (three) times daily. 02/03/16   Ramonita Lab, MD  docusate sodium (COLACE) 100 MG capsule Take 1 capsule (100 mg total) by mouth 2 (two) times daily as needed for mild constipation. 02/03/16   Gouru, Deanna Artis, MD  fluticasone (FLONASE) 50 MCG/ACT nasal spray Place 2 sprays daily into both nostrils. 02/03/17   Lourie Retz, Charlesetta Ivory, PA-C  metroNIDAZOLE (FLAGYL) 500 MG tablet Take 1 tablet (500 mg total) by mouth 2 (two) times daily. 10/14/16   Cuthriell, Delorise Royals, PA-C  predniSONE (STERAPRED UNI-PAK 21 TAB) 10 MG (21) TBPK tablet 6-day taper as directed. 02/03/17   Raneisha Bress, Charlesetta Ivory, PA-C  QUEtiapine (SEROQUEL) 25 MG tablet Take 1 tablet (25 mg total) by mouth 2 (two) times daily. 02/03/16   Ramonita Lab, MD  Allergies Ambien [zolpidem tartrate]; Ambien [zolpidem tartrate]; Clindamycin/lincomycin; Keflex [cephalexin]; Ritalin [methylphenidate hcl]; and Ritalin [methylphenidate hcl]  Family History  Problem Relation Age of Onset  . Factor V Leiden deficiency Mother        deceased of drug overdose    Social History Social History   Tobacco Use  . Smoking status: Current Every Day Smoker    Packs/day: 1.00    Years: 20.00    Pack years: 20.00    Types: Cigarettes  . Smokeless tobacco: Never Used  Substance Use Topics  . Alcohol use: No  . Drug use: No    Comment: last use  New Year's eve.    Review of Systems  Constitutional: Negative for fever. Eyes: Negative for visual changes. ENT: Negative for sore throat. Cardiovascular: Negative for chest pain. Respiratory: Negative for shortness of breath.  Reports cough as above. Gastrointestinal: Negative for abdominal pain, vomiting and diarrhea. Musculoskeletal: Negative for back pain. Skin: Negative for rash. Neurological: Negative for headaches, focal weakness or numbness. ____________________________________________  PHYSICAL EXAM:  VITAL SIGNS: ED Triage Vitals  Enc Vitals Group     BP 02/03/17 1037 121/65     Pulse Rate 02/03/17 1037 79     Resp 02/03/17 1037 16     Temp 02/03/17 1037 98.5 F (36.9 C)     Temp Source 02/03/17 1037 Oral     SpO2 02/03/17 1037 98 %     Weight 02/03/17 1037 233 lb 9.6 oz (106 kg)     Height 02/03/17 1035 5\' 5"  (1.651 m)     Head Circumference --      Peak Flow --      Pain Score 02/03/17 1035 8     Pain Loc --      Pain Edu? --      Excl. in GC? --     Constitutional: Alert and oriented. Well appearing and in no distress.  Patient resting comfortably upon entering the room. Head: Normocephalic and atraumatic. Eyes: Conjunctivae are normal. Normal extraocular movements Ears: Canals clear. TMs intact bilaterally. Nose: No congestion/rhinorrhea/epistaxis. Mouth/Throat: Mucous membranes are moist.  Uvula is midline and tonsils are flat. Neck: Supple. No thyromegaly. Hematological/Lymphatic/Immunological: No cervical lymphadenopathy. Cardiovascular: Normal rate, regular rhythm. Normal distal pulses. Respiratory: Normal respiratory effort. No wheezes/rales/rhonchi. Neurologic:  Normal gait without ataxia. Normal speech and language. No gross focal neurologic deficits are appreciated. Skin:  Skin is warm, dry and intact. No rash noted. ____________________________________________   LABS (pertinent positives/negatives)  Labs Reviewed  POCT RAPID STREP A   ____________________________________________   RADIOLOGY  CXR IMPRESSION: No acute cardiopulmonary disease . ____________________________________________  PROCEDURES  Zofran 4 mg ODT Tylenol #3 1 PO ____________________________________________  INITIAL IMPRESSION / ASSESSMENT AND PLAN / ED COURSE  Patient with ED evaluation of cough and sore throat.  Her chest x-ray is negative for any acute cardia pulmonary process.  Her rapid strep test is also negative.  Her exam is overall benign without any acute respiratory distress, or signs of strep pharyngitis.  Symptoms likely represent a viral URI.  Patient will be discharged with prescriptions for Tylenol 3 (10), albuterol inhaler, Tessalon Perles, Flonase, and a prednisone taper pack.  Work note is provided for 3 days as requested.  Return precautions overall reviewed.  She will continue to monitor treat fevers as discussed. ____________________________________________  FINAL CLINICAL IMPRESSION(S) / ED DIAGNOSES  Final diagnoses:  Viral upper respiratory tract infection      Inita Uram, Charlesetta IvoryJenise V Bacon, PA-C 02/03/17  1921    McShane, JamesJeanmarie Plant A, MD 02/06/17 [phone]

## 2017-03-04 IMAGING — CR DG WRIST COMPLETE 3+V*L*
1 series · 4 of 4 positions shown · non-contrast
Comparison: LEFT wrist radiograph December 18, 2010

CLINICAL DATA: Dog bite to LEFT wrist at [DATE] p.m.

EXAM:
LEFT WRIST - COMPLETE 3+ VIEW

[Series 1: x wrist pa left · 0.14mm/px · 4 of 4 slices shown]
[im 1/4]
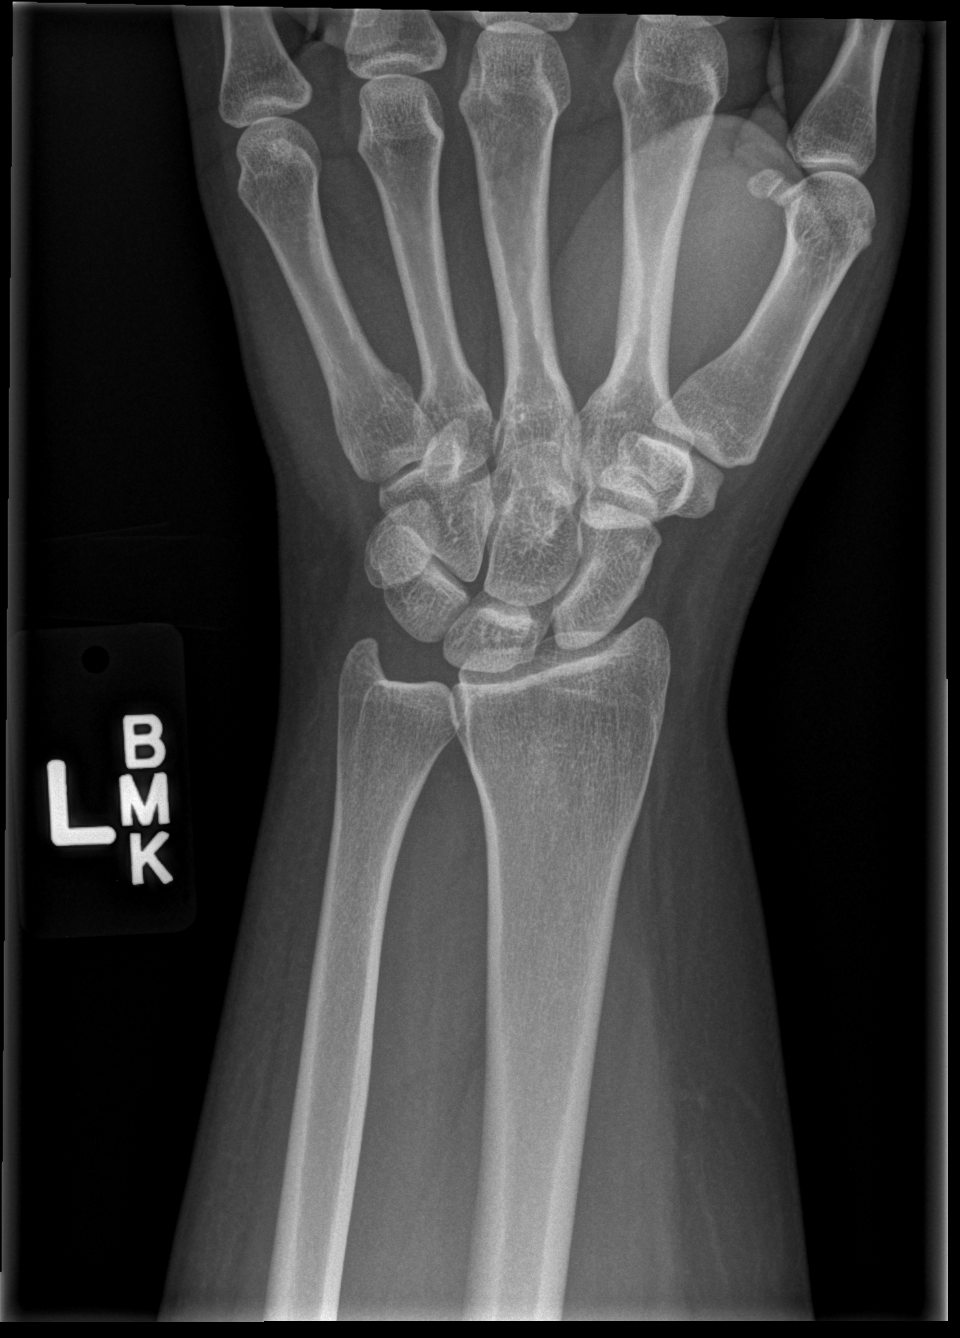
[im 2/4]
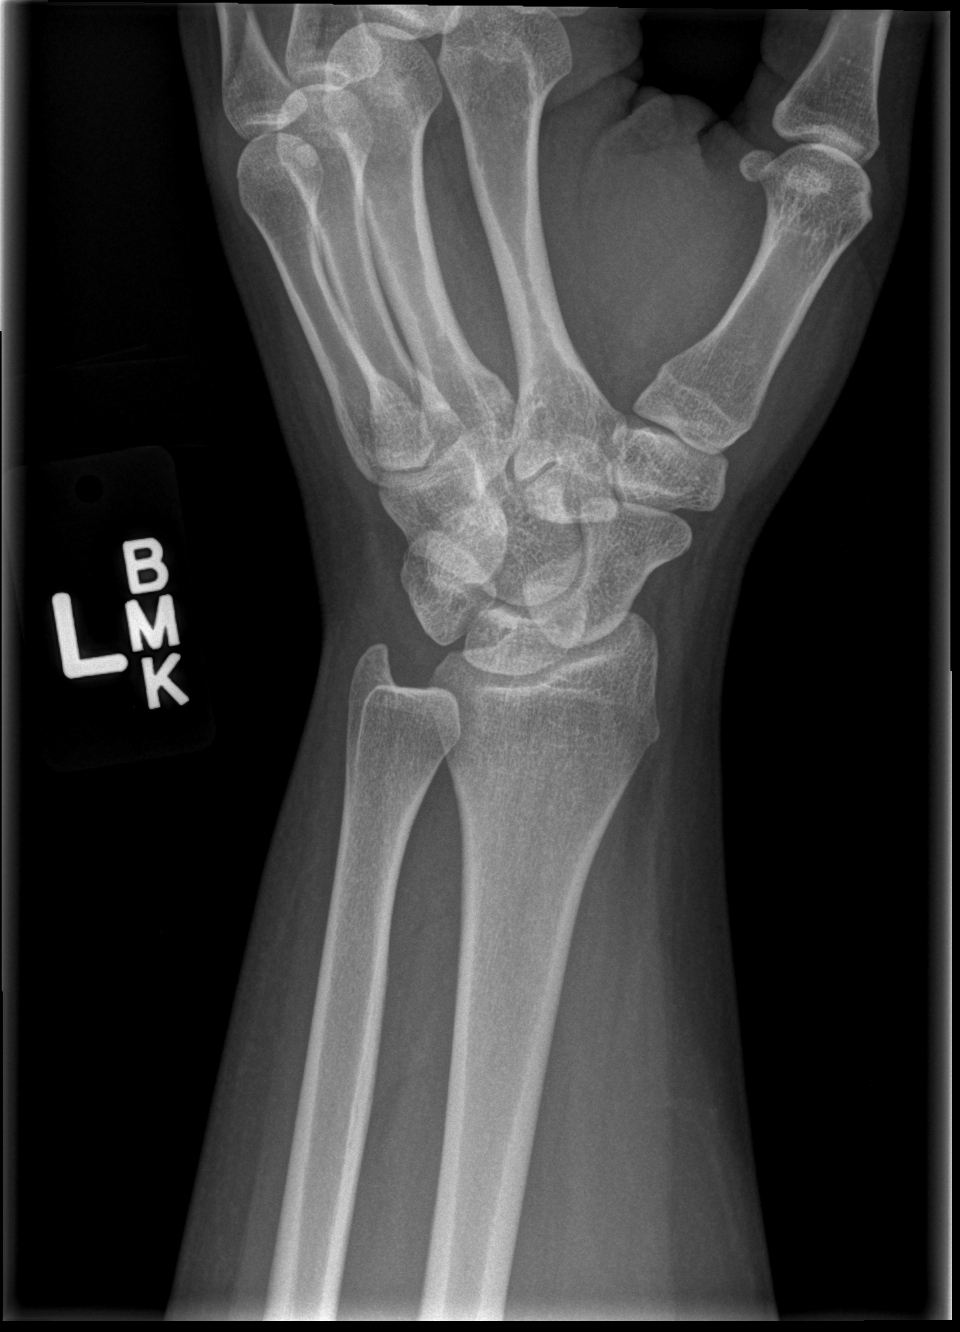
[im 3/4]
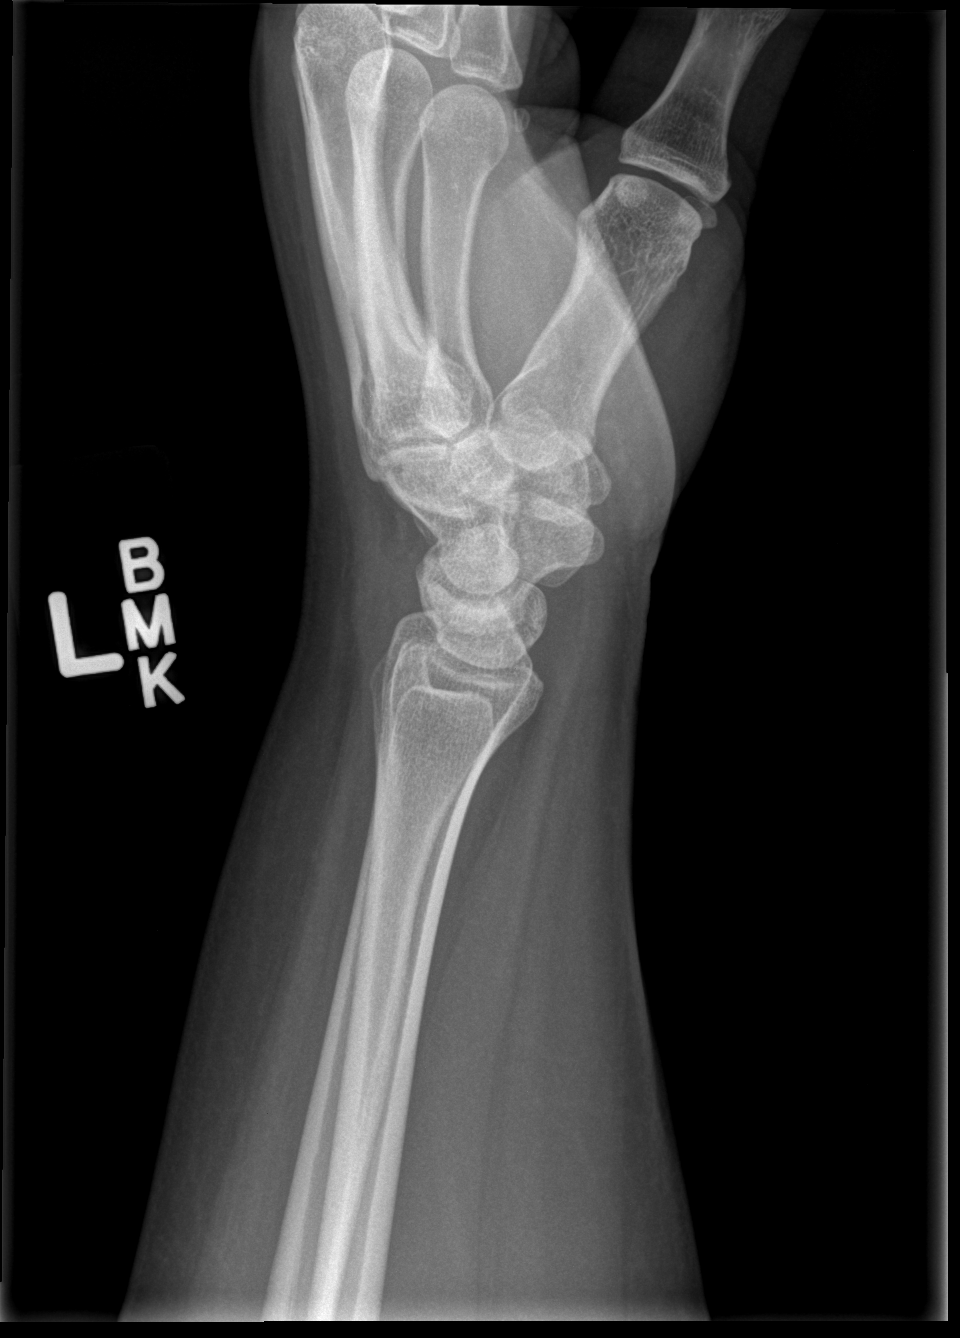
[im 4/4]
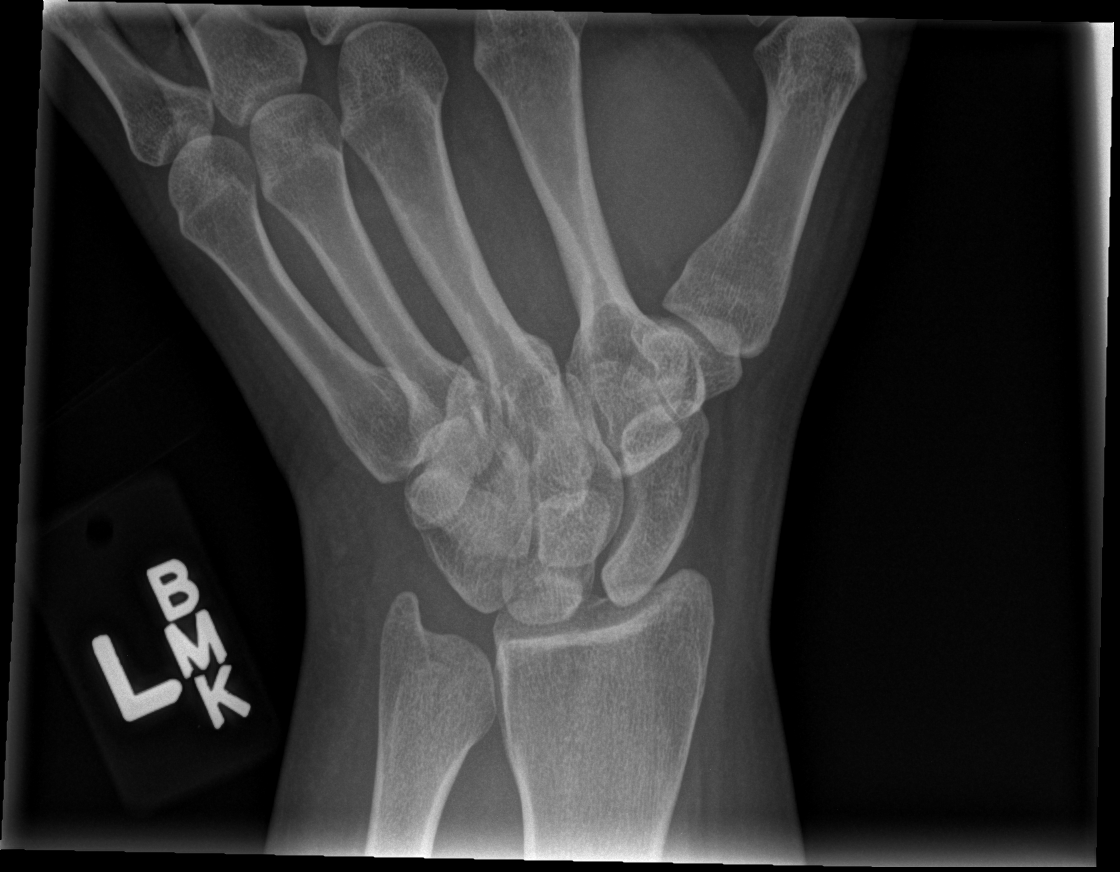

[4 of 4 positions shown; findings below may reference images not displayed]

FINDINGS: There is no evidence of fracture or dislocation. There is no
evidence of arthropathy or other focal bone abnormality. Soft
tissues are unremarkable.
IMPRESSION: Negative.

## 2017-06-23 ENCOUNTER — Emergency Department
Admission: EM | Admit: 2017-06-23 | Discharge: 2017-06-23 | Disposition: A | Discharge status: Home / Self Care | Payer: Medicaid Other | Attending: Emergency Medicine | Admitting: Emergency Medicine

## 2017-06-23 ENCOUNTER — Encounter: Payer: Self-pay | Admitting: Emergency Medicine

## 2017-06-23 ENCOUNTER — Other Ambulatory Visit: Payer: Self-pay

## 2017-06-23 DIAGNOSIS — R112 Nausea with vomiting, unspecified: Secondary | ICD-10-CM | POA: Diagnosis not present

## 2017-06-23 DIAGNOSIS — R109 Unspecified abdominal pain: Secondary | ICD-10-CM | POA: Diagnosis present

## 2017-06-23 DIAGNOSIS — Z760 Encounter for issue of repeat prescription: Secondary | ICD-10-CM | POA: Diagnosis not present

## 2017-06-23 LAB — CBC
HCT: 40.9 % (ref 35.0–47.0)
Hemoglobin: 14.4 g/dL (ref 12.0–16.0)
MCH: 31.4 pg (ref 26.0–34.0)
MCHC: 35.4 g/dL (ref 32.0–36.0)
MCV: 88.9 fL (ref 80.0–100.0)
Platelets: 304 10*3/uL (ref 150–440)
RBC: 4.6 MIL/uL (ref 3.80–5.20)
RDW: 14.3 % (ref 11.5–14.5)
WBC: 10.2 10*3/uL (ref 3.6–11.0)

## 2017-06-23 LAB — COMPREHENSIVE METABOLIC PANEL
ALBUMIN: 4.4 g/dL (ref 3.5–5.0)
ALK PHOS: 83 U/L (ref 38–126)
ALT: 12 U/L — ABNORMAL LOW (ref 14–54)
ANION GAP: 7 (ref 5–15)
AST: 18 U/L (ref 15–41)
BILIRUBIN TOTAL: 0.5 mg/dL (ref 0.3–1.2)
BUN: 16 mg/dL (ref 6–20)
CALCIUM: 9.5 mg/dL (ref 8.9–10.3)
CO2: 24 mmol/L (ref 22–32)
Chloride: 109 mmol/L (ref 101–111)
Creatinine, Ser: 1.11 mg/dL — ABNORMAL HIGH (ref 0.44–1.00)
GFR calc Af Amer: >60 mL/min (ref >60)
GLUCOSE: 72 mg/dL (ref 65–99)
Potassium: 3.7 mmol/L (ref 3.5–5.1)
Sodium: 140 mmol/L (ref 135–145)
TOTAL PROTEIN: 7.9 g/dL (ref 6.5–8.1)

## 2017-06-23 LAB — URINALYSIS, COMPLETE (UACMP) WITH MICROSCOPIC
BILIRUBIN URINE: NEGATIVE
Bacteria, UA: NONE SEEN
Glucose, UA: NEGATIVE mg/dL
HGB URINE DIPSTICK: NEGATIVE
Ketones, ur: NEGATIVE mg/dL
LEUKOCYTES UA: NEGATIVE
NITRITE: NEGATIVE
PH: 6 (ref 5.0–8.0)
Protein, ur: NEGATIVE mg/dL
SPECIFIC GRAVITY, URINE: 1.004 — AB (ref 1.005–1.030)

## 2017-06-23 LAB — CHLAMYDIA/NGC RT PCR (ARMC ONLY)
Chlamydia Tr: NOT DETECTED
N gonorrhoeae: NOT DETECTED

## 2017-06-23 LAB — LIPASE, BLOOD: Lipase: 50 U/L (ref 11–51)

## 2017-06-23 LAB — POCT PREGNANCY, URINE: Preg Test, Ur: NEGATIVE

## 2017-06-23 MED ORDER — PANTOPRAZOLE SODIUM 40 MG PO TBEC: 40.0000 mg | DELAYED_RELEASE_TABLET | Freq: Every day | ORAL | 1 refills | Status: DC

## 2017-06-23 MED ORDER — GABAPENTIN 100 MG PO CAPS: 100.0000 mg | ORAL_CAPSULE | Freq: Three times a day (TID) | ORAL | 2 refills | Status: DC

## 2017-06-23 MED ORDER — ALBUTEROL SULFATE HFA 108 (90 BASE) MCG/ACT IN AERS: 2.0000 | INHALATION_SPRAY | Freq: Four times a day (QID) | RESPIRATORY_TRACT | 2 refills | Status: DC | PRN

## 2017-06-23 MED ORDER — ONDANSETRON 4 MG PO TBDP: 4.0000 mg | ORAL_TABLET | Freq: Three times a day (TID) | ORAL | 0 refills | Status: DC | PRN

## 2017-06-23 MED ORDER — APIXABAN 5 MG PO TABS: 5.0000 mg | ORAL_TABLET | Freq: Two times a day (BID) | ORAL | 0 refills | Status: DC

## 2017-06-23 NOTE — ED Provider Notes (Signed)
[facility] Emergency Department Provider Note       Time seen: ----------------------------------------- 8:02 AM on 06/23/2017 -----------------------------------------   I have reviewed the triage vital signs and the nursing notes.  HISTORY   Chief Complaint Abdominal Pain    HPI Brianna Ashley is a 32 y.o. female with a history of asthma, DVT, migraine, seizure, factor V Leiden mutation who presents to the ED for abdominal pain and vomiting that is been intermittent for the past month.  Patient concerned that she may be pregnant because she is actively trying to get pregnant.  She has had at least 7 pregnancies in the past she states.  She currently has had intermittent vaginal spotting.  Nothing makes her symptoms better or worse.  Past Medical History:  Diagnosis Date  . Asthma   . Carpal tunnel syndrome   . DVT of lower extremity (deep venous thrombosis) (HCC)   . Factor 5 Leiden mutation, heterozygous (HCC)   . Migraine   . Seizures [facility])     Patient Active Problem List   Diagnosis Date Noted  . Factor V Leiden (HCC) 03/28/2016  . Acute deep vein thrombosis (DVT) of femoral vein of left lower extremity (HCC) 03/28/2016  . Lupus anticoagulant positive 03/28/2016  . DVT (deep venous thrombosis) (HCC) 02/02/2016    Past Surgical History:  Procedure Laterality Date  . ABSCESS DRAINAGE    . CESAREAN SECTION    . DILATION AND CURETTAGE OF UTERUS      Allergies Ambien [zolpidem tartrate]; Ambien [zolpidem tartrate]; Clindamycin/lincomycin; Keflex [cephalexin]; Ritalin [methylphenidate hcl]; and Ritalin [methylphenidate hcl]  Social History Social History   Tobacco Use  . Smoking status: Current Every Day Smoker    Packs/day: 1.00    Years: 20.00    Pack years: 20.00    Types: Cigarettes  . Smokeless tobacco: Never Used  Substance Use Topics  . Alcohol use: No  . Drug use: No    Comment: last use New Year's eve.   Review of  Systems Constitutional: Negative for fever. Cardiovascular: Negative for chest pain. Respiratory: Negative for shortness of breath. Gastrointestinal: Positive for abdominal pain, vomiting Musculoskeletal: Negative for back pain. Skin: Negative for rash. Neurological: Negative for headaches, focal weakness or numbness.  All systems negative/normal/unremarkable except as stated in the HPI  ____________________________________________   PHYSICAL EXAM:  VITAL SIGNS: ED Triage Vitals  Enc Vitals Group     BP 06/23/17 0149 139/86     Pulse Rate 06/23/17 0149 78     Resp 06/23/17 0149 18     Temp 06/23/17 0149 97.7 F (36.5 C)     Temp Source 06/23/17 0149 Oral     SpO2 06/23/17 0149 100 %     Weight 06/23/17 0150 227 lb 4.7 oz (103.1 kg)     Height 06/23/17 0150  (1.651 m)     Head Circumference --      Peak Flow --      Pain Score 06/23/17 0150 10     Pain Loc --      Pain Edu? --      Excl. in GC? --    Constitutional: Alert and oriented. Well appearing and in no distress. Eyes: Conjunctivae are normal. Normal extraocular movements. ENT   Head: Normocephalic and atraumatic.   Nose: No congestion/rhinnorhea.   Mouth/Throat: Mucous membranes are moist.   Neck: No stridor. Cardiovascular: Normal rate, regular rhythm. No murmurs, rubs, or gallops. Respiratory: Normal respiratory effort without tachypnea  nor retractions. Breath sounds are clear and equal bilaterally. No wheezes/rales/rhonchi. Gastrointestinal: Soft and nontender. Normal bowel sounds Musculoskeletal: Nontender with normal range of motion in extremities. No lower extremity tenderness nor edema. Neurologic:  Normal speech and language. No gross focal neurologic deficits are appreciated.  Skin:  Skin is warm, dry and intact. No rash noted. Psychiatric: Mood and affect are normal. Speech and behavior are normal.  ____________________________________________  ED COURSE:  As part of my medical  decision making, I reviewed the following data within the electronic MEDICAL RECORD NUMBER History obtained from family if available, nursing notes, old chart and ekg, as well as notes from prior ED visits. Patient presented for abdominal pain and vomiting that has been intermittent, we will assess with labs as indicated at this time.   Procedures ____________________________________________   LABS (pertinent positives/negatives)  Labs Reviewed  COMPREHENSIVE METABOLIC PANEL - Abnormal; Notable for the following components:      Result Value   Creatinine, Ser 1.11 (*)    ALT 12 (*)    All other components within normal limits  URINALYSIS, COMPLETE (UACMP) WITH MICROSCOPIC - Abnormal; Notable for the following components:   Color, Urine STRAW (*)    APPearance CLEAR (*)    Specific Gravity, Urine 1.004 (*)    Squamous Epithelial / LPF 0-5 (*)    All other components within normal limits  CHLAMYDIA/NGC RT PCR ([facility] ONLY)  LIPASE, BLOOD  CBC  POC URINE PREG, ED  POCT PREGNANCY, URINE   ___________________________________________  DIFFERENTIAL DIAGNOSIS   Pregnancy, GERD, gastritis, IBS  FINAL ASSESSMENT AND PLAN  Abdominal pain, vomiting   Plan: The patient had presented for intermittent abdominal pain and vomiting. Patient's labs are reassuring.  She is being presumptively treated for GERD.  I will also refill some of her medications because she should be on Eliquis with her history of blood clots and factor V Leiden.  She states she will follow-up with her doctor for reevaluation.   Ulice Dash, MD   Note: This note was generated in part or whole with voice recognition software. Voice recognition is usually quite accurate but there are transcription errors that can and very often do occur. I apologize for any typographical errors that were not detected and corrected.     Emily Filbert, MD 06/23/17 [phone]

## 2017-06-23 NOTE — ED Triage Notes (Signed)
Pt arrived to the ED for complaints of abdominal pain and vomiting for the last month. Pt states "that her family keeps telling her that she is pregnant and she believed that she might be." Pt reports that she has been having her period but she had a period while she was pregnant in the past. Pt is AOx4 in no apparent distress.

## 2018-01-18 DIAGNOSIS — I82512 Chronic embolism and thrombosis of left femoral vein: Secondary | ICD-10-CM | POA: Diagnosis not present

## 2018-01-18 DIAGNOSIS — Z7901 Long term (current) use of anticoagulants: Secondary | ICD-10-CM | POA: Insufficient documentation

## 2018-01-18 DIAGNOSIS — Z79899 Other long term (current) drug therapy: Secondary | ICD-10-CM | POA: Diagnosis not present

## 2018-01-18 DIAGNOSIS — J45909 Unspecified asthma, uncomplicated: Secondary | ICD-10-CM | POA: Insufficient documentation

## 2018-01-18 DIAGNOSIS — F17210 Nicotine dependence, cigarettes, uncomplicated: Secondary | ICD-10-CM | POA: Insufficient documentation

## 2018-01-18 DIAGNOSIS — M79605 Pain in left leg: Secondary | ICD-10-CM | POA: Diagnosis present

## 2018-01-18 NOTE — ED Triage Notes (Signed)
Patient reports pain and swelling in left leg for approximately a month.  Reports history of dvt in same leg.  Patient also reports having jaw pain that radiates into her ear.

## 2018-01-19 ENCOUNTER — Encounter: Payer: Self-pay | Admitting: Emergency Medicine

## 2018-01-19 ENCOUNTER — Emergency Department
Admission: EM | Admit: 2018-01-19 | Discharge: 2018-01-19 | Disposition: A | Discharge status: Home / Self Care | Payer: Medicaid Other | Attending: Emergency Medicine | Admitting: Emergency Medicine

## 2018-01-19 ENCOUNTER — Emergency Department: Payer: Medicaid Other

## 2018-01-19 ENCOUNTER — Other Ambulatory Visit: Payer: Self-pay

## 2018-01-19 DIAGNOSIS — I82512 Chronic embolism and thrombosis of left femoral vein: Secondary | ICD-10-CM

## 2018-01-19 LAB — POCT PREGNANCY, URINE: PREG TEST UR: NEGATIVE

## 2018-01-19 MED ORDER — APIXABAN 5 MG PO TABS
5.0000 mg | ORAL_TABLET | Freq: Once | ORAL | Status: AC
  Administered 2018-01-19: 5 mg via ORAL
  Filled 2018-01-19: qty 1

## 2018-01-19 MED ORDER — GABAPENTIN 600 MG PO TABS
ORAL_TABLET | ORAL | Status: AC
  Administered 2018-01-19: 900 mg
  Filled 2018-01-19: qty 2

## 2018-01-19 MED ORDER — GABAPENTIN 300 MG PO CAPS: 900.0000 mg | ORAL_CAPSULE | Freq: Once | ORAL | Status: DC

## 2018-01-19 MED ORDER — KETOROLAC TROMETHAMINE 30 MG/ML IJ SOLN
30.0000 mg | Freq: Once | INTRAMUSCULAR | Status: AC
  Administered 2018-01-19: 30 mg via INTRAMUSCULAR
  Filled 2018-01-19: qty 1

## 2018-01-19 MED ORDER — ACETAMINOPHEN 500 MG PO TABS: 1000.0000 mg | ORAL_TABLET | Freq: Four times a day (QID) | ORAL | 0 refills | Status: AC | PRN

## 2018-01-19 MED ORDER — HYDROCODONE-ACETAMINOPHEN 5-325 MG PO TABS
1.0000 | ORAL_TABLET | Freq: Once | ORAL | Status: AC
  Administered 2018-01-19: 1 via ORAL
  Filled 2018-01-19: qty 1

## 2018-01-19 MED ORDER — IBUPROFEN 600 MG PO TABS: 600.0000 mg | ORAL_TABLET | Freq: Three times a day (TID) | ORAL | 0 refills | Status: DC | PRN

## 2018-01-19 MED ORDER — APIXABAN 5 MG PO TABS: 5.0000 mg | ORAL_TABLET | Freq: Two times a day (BID) | ORAL | 0 refills | Status: DC

## 2018-01-19 NOTE — ED Notes (Signed)
Patient discharged to home per MD order. Patient in stable condition, and deemed medically cleared by ED provider for discharge. Discharge instructions reviewed with patient/family using "Teach Back"; verbalized understanding of medication education and administration, and information about follow-up care. Denies further concerns. ° °

## 2018-01-19 NOTE — Discharge Instructions (Signed)
It was a pleasure to take care of you today, and thank you for coming to our emergency department.  If you have any questions or concerns before leaving please ask the nurse to grab me and I'm more than happy to go through your aftercare instructions again.  If you were prescribed any opioid pain medication today such as Norco, Vicodin, Percocet, morphine, hydrocodone, or oxycodone please make sure you do not drive when you are taking this medication as it can alter your ability to drive safely.  If you have any concerns once you are home that you are not improving or are in fact getting worse before you can make it to your follow-up appointment, please do not hesitate to call [phone] and come back for further evaluation.  Merrily Brittle, MD  Results for orders placed or performed during the hospital encounter of 01/19/18  Pregnancy, urine POC  Result Value Ref Range   Preg Test, Ur NEGATIVE NEGATIVE   US Venous Img Lower Unilateral Left  Result Date: 01/19/2018 CLINICAL DATA:  Pain and swelling left lower extremity 1 month. Patient on Eliquis. EXAM: LEFT LOWER EXTREMITY VENOUS DOPPLER ULTRASOUND TECHNIQUE: Gray-scale sonography with graded compression, as well as color Doppler and duplex ultrasound were performed to evaluate the lower extremity deep venous systems from the level of the common femoral vein and including the common femoral, femoral, profunda femoral, popliteal and calf veins including the posterior tibial, peroneal and gastrocnemius veins when visible. The superficial great saphenous vein was also interrogated. Spectral Doppler was utilized to evaluate flow at rest and with distal augmentation maneuvers in the common femoral, femoral and popliteal veins. COMPARISON:  07/02/2016 FINDINGS: Contralateral Common Femoral Vein: Respiratory phasicity is normal and symmetric with the symptomatic side. No evidence of thrombus. Normal compressibility. Common Femoral Vein: Evidence of  noncompressible thrombus which is nonocclusive. Saphenofemoral Junction: No evidence of thrombus. Normal compressibility and flow on color Doppler imaging. Profunda Femoral Vein: Nonocclusive, noncompressible thrombus visualized. Femoral Vein: Nonocclusive, non compressible thrombus visualized. Popliteal Vein: Nonocclusive, noncompressible thrombus visualized. Calf Veins: No evidence of thrombus. Normal compressibility and flow on color Doppler imaging. Superficial Great Saphenous Vein: No evidence of thrombus. Normal compressibility. Venous Reflux:  None. Other Findings:  None. IMPRESSION: Nonocclusive thrombosis involving the left common femoral vein, femoral vein, profunda femoral vein and popliteal veins age indeterminate, but new compared to the previous exam from April 2018. Critical Value/emergent results were called by telephone at the time of interpretation on 01/19/2018 at 2:44 am to Dr. Loleta Rose , who verbally acknowledged these results. Electronically Signed   By: Elberta Fortis M.D.   On: 01/19/2018 02:47

## 2018-01-19 NOTE — ED Notes (Signed)
Pt in with co LLE swelling states is supposed to be on eliquis but was unable to take it due to being incarcerated. Pt states she noted swelling for a month, pulses wnl, swelling noted to LLE. Pt has hx of LLE DVT in the past.

## 2018-01-19 NOTE — ED Provider Notes (Signed)
[facility] Emergency Department Provider Note  ____________________________________________   First MD Initiated Contact with Patient 01/19/18 [phone]     (approximate)  I have reviewed the triage vital signs and the nursing notes.   HISTORY  Chief Complaint Leg Swelling and Leg Pain   HPI Brianna Ashley is a 32 y.o. female who self presents to the emergency department with several months of painful swelling to her left lower extremity.  She has a known history of previous DVTs and is hypercoagulable.  She says that she has been intermittently compliant with her Eliquis as she is running out and she has been rationing them.  She feels her symptoms began sometime between April and August this year while she was incarcerated and says that she did not have access to all of her medications.  She denies chest pain or shortness of breath.  The pain is moderate to severe throbbing aching worse with ambulation improved with rest.  No fevers or chills.   The pain is nonradiating.     Past Medical History:  Diagnosis Date  . Asthma   . Carpal tunnel syndrome   . DVT of lower extremity (deep venous thrombosis) (HCC)   . Factor 5 Leiden mutation, heterozygous (HCC)   . Migraine   . Seizures [facility])     Patient Active Problem List   Diagnosis Date Noted  . Factor V Leiden (HCC) 03/28/2016  . Acute deep vein thrombosis (DVT) of femoral vein of left lower extremity (HCC) 03/28/2016  . Lupus anticoagulant positive 03/28/2016  . DVT (deep venous thrombosis) (HCC) 02/02/2016    Past Surgical History:  Procedure Laterality Date  . ABSCESS DRAINAGE    . CESAREAN SECTION    . DILATION AND CURETTAGE OF UTERUS      Prior to Admission medications   Medication Sig Start Date End Date Taking? Authorizing Provider  acetaminophen (TYLENOL) 500 MG tablet Take 2 tablets (1,000 mg total) by mouth every 6 (six) hours as needed. 01/19/18 01/19/19  Merrily Brittle, MD    acetaminophen-codeine (TYLENOL #3) 300-30 MG tablet Take 1 tablet every 6 (six) hours as needed by mouth for moderate pain. 02/03/17   Menshew, Charlesetta Ivory, PA-C  albuterol (PROVENTIL HFA;VENTOLIN HFA) 108 (90 Base) MCG/ACT inhaler Inhale 2 puffs every 6 (six) hours as needed into the lungs for wheezing or shortness of breath. 02/03/17   Menshew, Charlesetta Ivory, PA-C  albuterol (PROVENTIL HFA;VENTOLIN HFA) 108 (90 Base) MCG/ACT inhaler Inhale 2 puffs into the lungs every 6 (six) hours as needed for wheezing or shortness of breath. 06/23/17   Emily Filbert, MD  apixaban (ELIQUIS) 5 MG TABS tablet Take 1 tablet (5 mg total) by mouth 2 (two) times daily. 01/19/18 02/18/18  Merrily Brittle, MD  benzonatate (TESSALON PERLES) 100 MG capsule Take 1-2 tabs TID prn cough 02/03/17   Menshew, Charlesetta Ivory, PA-C  citalopram (CELEXA) 40 MG tablet Take 1 tablet (40 mg total) by mouth daily. 02/04/16   Gouru, Deanna Artis, MD  clonazePAM (KLONOPIN) 1 MG tablet Take 1 tablet (1 mg total) by mouth 3 (three) times daily. 02/03/16   Ramonita Lab, MD  docusate sodium (COLACE) 100 MG capsule Take 1 capsule (100 mg total) by mouth 2 (two) times daily as needed for mild constipation. 02/03/16   Gouru, Deanna Artis, MD  fluticasone (FLONASE) 50 MCG/ACT nasal spray Place 2 sprays daily into both nostrils. 02/03/17   Menshew, Charlesetta Ivory, PA-C  gabapentin (NEURONTIN) 100  MG capsule Take 1 capsule (100 mg total) by mouth 3 (three) times daily. 06/23/17 06/23/18  Emily Filbert, MD  ibuprofen (ADVIL,MOTRIN) 600 MG tablet Take 1 tablet (600 mg total) by mouth every 8 (eight) hours as needed. 01/19/18   Merrily Brittle, MD  metroNIDAZOLE (FLAGYL) 500 MG tablet Take 1 tablet (500 mg total) by mouth 2 (two) times daily. 10/14/16   Cuthriell, Delorise Royals, PA-C  ondansetron (ZOFRAN ODT) 4 MG disintegrating tablet Take 1 tablet (4 mg total) by mouth every 8 (eight) hours as needed for nausea or vomiting. 06/23/17   Emily Filbert, MD   pantoprazole (PROTONIX) 40 MG tablet Take 1 tablet (40 mg total) by mouth daily. 06/23/17 06/23/18  Emily Filbert, MD  predniSONE (STERAPRED UNI-PAK 21 TAB) 10 MG (21) TBPK tablet 6-day taper as directed. 02/03/17   Menshew, Charlesetta Ivory, PA-C  QUEtiapine (SEROQUEL) 25 MG tablet Take 1 tablet (25 mg total) by mouth 2 (two) times daily. 02/03/16   Ramonita Lab, MD    Allergies Ambien [zolpidem tartrate]; Ambien [zolpidem tartrate]; Clindamycin/lincomycin; Keflex [cephalexin]; Ritalin [methylphenidate hcl]; and Ritalin [methylphenidate hcl]  Family History  Problem Relation Age of Onset  . Factor V Leiden deficiency Mother        deceased of drug overdose    Social History Social History   Tobacco Use  . Smoking status: Current Every Day Smoker    Packs/day: 1.00    Years: 20.00    Pack years: 20.00    Types: Cigarettes  . Smokeless tobacco: Never Used  Substance Use Topics  . Alcohol use: No  . Drug use: No    Comment: last use New Year's eve.    Review of Systems Constitutional: No fever/chills Eyes: No visual changes. ENT: No sore throat. Cardiovascular: Denies chest pain. Respiratory: Denies shortness of breath. Gastrointestinal: No abdominal pain.  No nausea, no vomiting.  No diarrhea.  No constipation. Genitourinary: Negative for dysuria. Musculoskeletal: Positive for leg swelling Skin: Negative for rash. Neurological: Negative for headaches, focal weakness or numbness.   ____________________________________________   PHYSICAL EXAM:  VITAL SIGNS: ED Triage Vitals  Enc Vitals Group     BP 01/19/18 0004 118/66     Pulse Rate 01/19/18 0004 70     Resp 01/19/18 0004 20     Temp 01/19/18 0004 98.2 F (36.8 C)     Temp Source 01/19/18 0004 Oral     SpO2 01/19/18 0004 100 %     Weight 01/19/18 0000 220 lb (99.8 kg)     Height 01/19/18 0000 5\' 5"  (1.651 m)     Head Circumference --      Peak Flow --      Pain Score 01/19/18 0000 10     Pain Loc --       Pain Edu? --      Excl. in GC? --     Constitutional: Alert and oriented x4 appears uncomfortable but nontoxic no diaphoresis speaks in full clear sentences Eyes: PERRL EOMI. Head: Atraumatic. Nose: No congestion/rhinnorhea. Mouth/Throat: No trismus Neck: No stridor.   Cardiovascular: Normal rate, regular rhythm. Grossly normal heart sounds.  Good peripheral circulation. Respiratory: Normal respiratory effort.  No retractions. Lungs CTAB and moving good air Gastrointestinal: Obese soft nontender Musculoskeletal: Lower extremity with some swelling although no cords appreciated no skin changes Neurologic:  Normal speech and language. No gross focal neurologic deficits are appreciated. Skin:  Skin is warm, dry and intact. No rash noted. Psychiatric:  Mood and affect are normal. Speech and behavior are normal.    ____________________________________________   DIFFERENTIAL includes but not limited to  DVT, pulmonary embolism, cellulitis, lymphadenitis ____________________________________________   LABS (all labs ordered are listed, but only abnormal results are displayed)  Labs Reviewed  POCT PREGNANCY, URINE    Lab work reviewed by me shows the patient is not pregnant __________________________________________  EKG   ____________________________________________  RADIOLOGY  Left lower extremity ultrasound reviewed by me consistent with nonocclusive clot in the femoral vein ____________________________________________   PROCEDURES  Procedure(s) performed: no  Procedures  Critical Care performed: no  ____________________________________________   INITIAL IMPRESSION / ASSESSMENT AND PLAN / ED COURSE  Pertinent labs & imaging results that were available during my care of the patient were reviewed by me and considered in my medical decision making (see chart for details).   As part of my medical decision making, I reviewed the following data within the electronic  MEDICAL RECORD NUMBER History obtained from family if available, nursing notes, old chart and ekg, as well as notes from prior ED visits.  The patient comes to the emergency department with chronic to subacute pain in her left lower extremity.  It is positive for DVT.  She does have a long-standing history of factor V Leiden and has been intermittently compliant with her Eliquis so she is running out.  Given gabapentin, IM Toradol, and Eliquis in addition to 1 tablet of Norco here today with improvement in her pain.  She is asking for IV Dilaudid and I explained to her that I would not administer intravenous opioids for a subacute issue.  I will refill her Eliquis ibuprofen and Tylenol and refer her both to primary care and hematology.  Strict return precautions have been given.      ____________________________________________   FINAL CLINICAL IMPRESSION(S) / ED DIAGNOSES  Final diagnoses:  Chronic deep vein thrombosis (DVT) of femoral vein of left lower extremity (HCC)      NEW MEDICATIONS STARTED DURING THIS VISIT:  Discharge Medication List as of 01/19/2018  3:22 AM    START taking these medications   Details  ibuprofen (ADVIL,MOTRIN) 600 MG tablet Take 1 tablet (600 mg total) by mouth every 8 (eight) hours as needed., Starting Tue 01/19/2018, Print         Note:  This document was prepared using Dragon voice recognition software and may include unintentional dictation errors.     Merrily Brittle, MD 01/21/18 2219

## 2018-01-22 LAB — GLUCOSE, CAPILLARY

## 2018-01-29 MED ORDER — MIDAZOLAM HCL 5 MG/5ML IJ SOLN
INTRAMUSCULAR | Status: AC
  Filled 2018-01-29: qty 5

## 2018-01-29 MED ORDER — FENTANYL CITRATE (PF) 100 MCG/2ML IJ SOLN
INTRAMUSCULAR | Status: AC
  Filled 2018-01-29: qty 4

## 2018-02-06 IMAGING — CT CT ANGIO CHEST
2 of 6 series · 18 of 46 positions shown · IV contrast (APPLIED)
Comparison: None.

CLINICAL DATA: Chest pain.  Factor 5 Fawaz.

EXAM:
CT ANGIOGRAPHY CHEST WITH CONTRAST
TECHNIQUE: Multidetector CT imaging of the chest was performed using the
standard protocol during bolus administration of intravenous
contrast. Multiplanar CT image reconstructions and MIPs were
obtained to evaluate the vascular anatomy.
CONTRAST:  100 cc Isovue 370 intravenous

[Series 5: pe thins 1.5 · axial · 0.68mm/px · z∈[-591,-353]mm · 15 of 222 slices shown]
[im 12/222  lung]
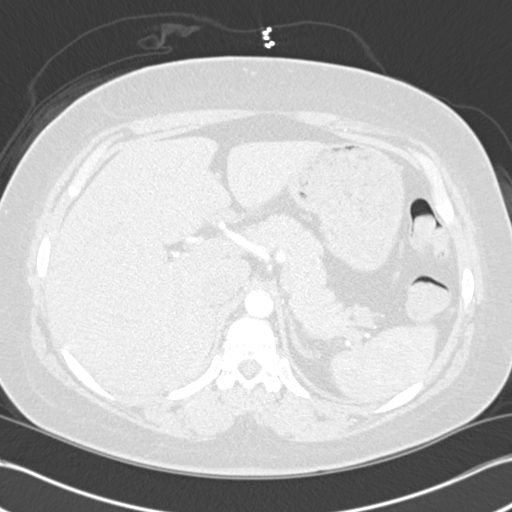
[im 24/222  soft-tissue]
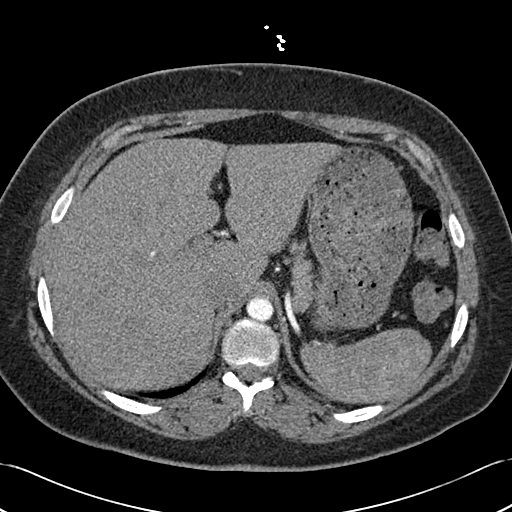
[im 47/222  lung]
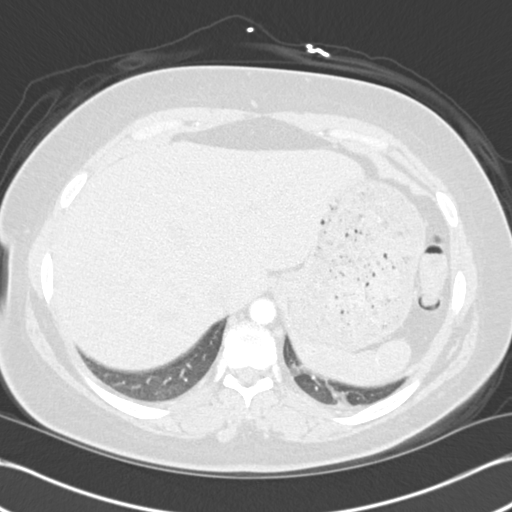
[im 59/222  soft-tissue]
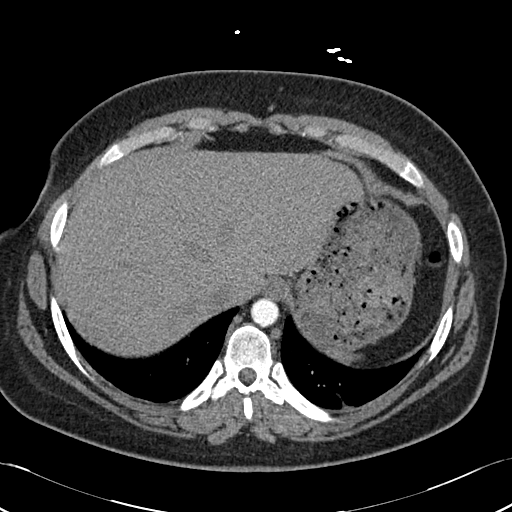
[im 70/222  lung]
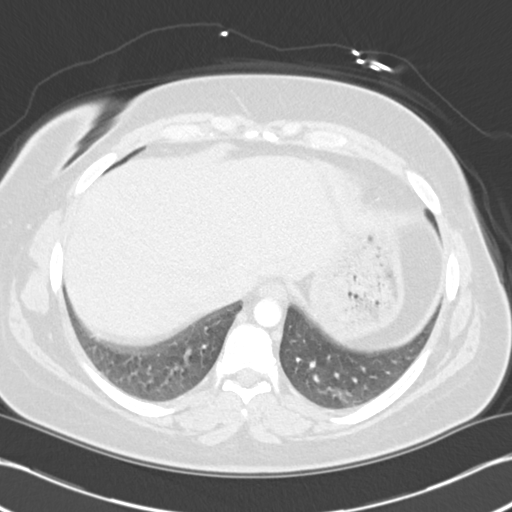
[im 82/222  soft-tissue]
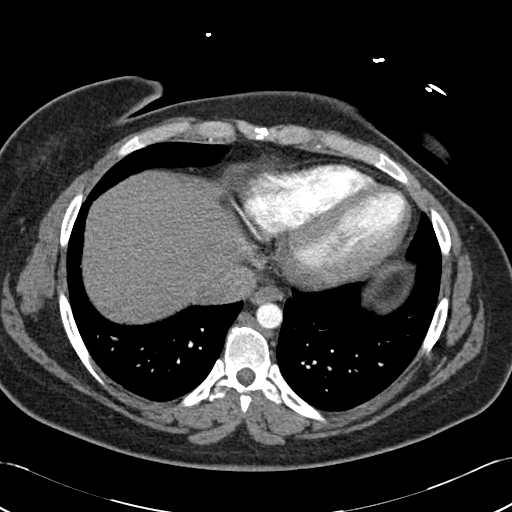
[im 94/222  lung]
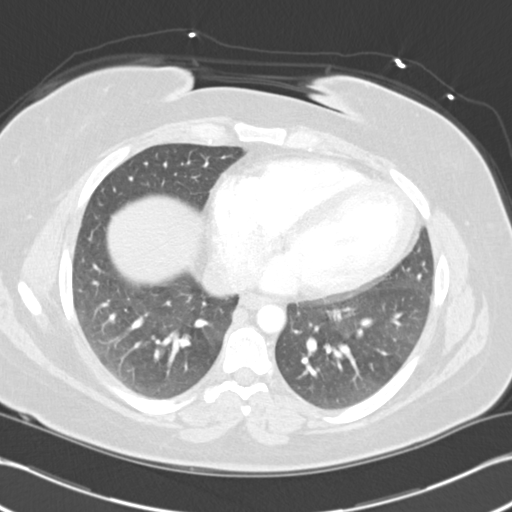
[im 117/222  soft-tissue]
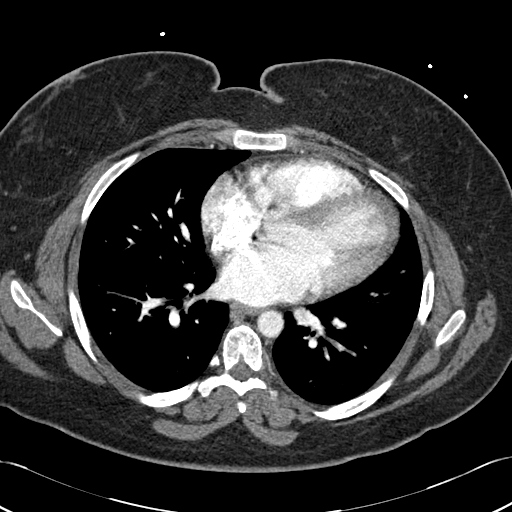
[im 128/222  lung]
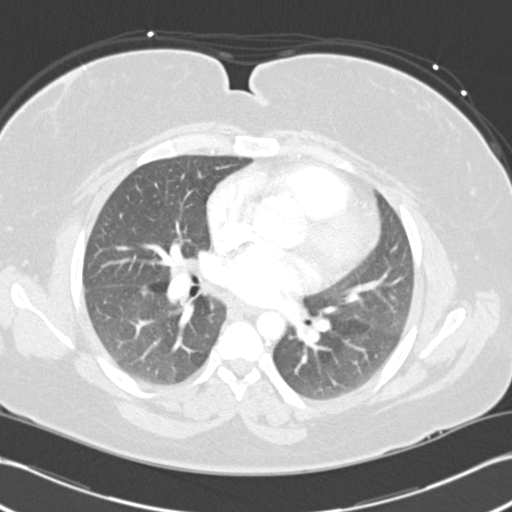
[im 140/222  soft-tissue]
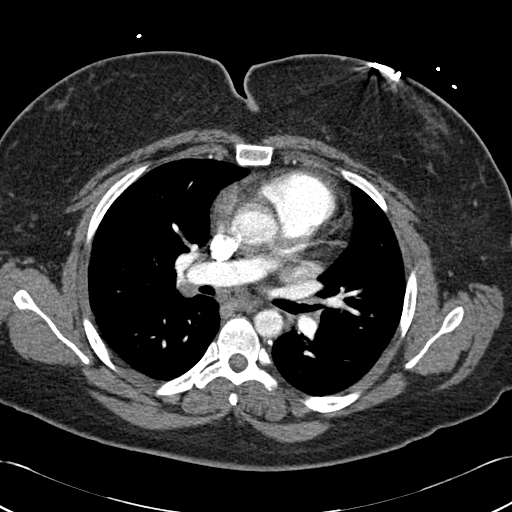
[im 152/222  lung]
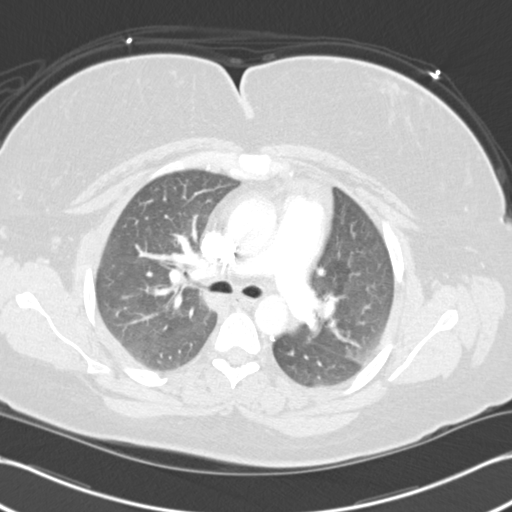
[im 163/222  soft-tissue]
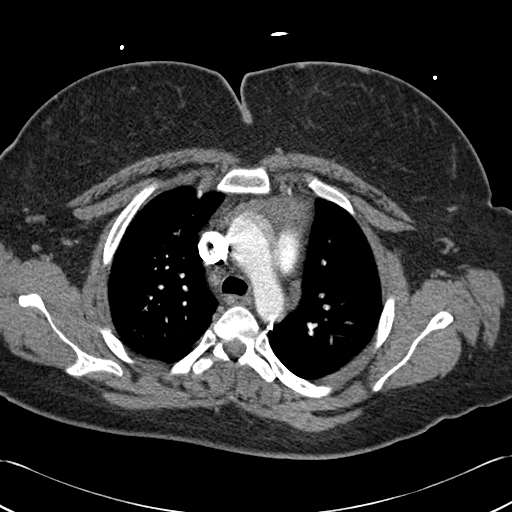
[im 187/222  lung]
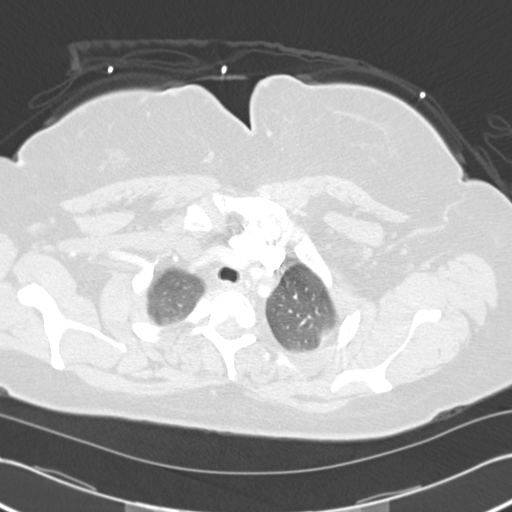
[im 198/222  soft-tissue]
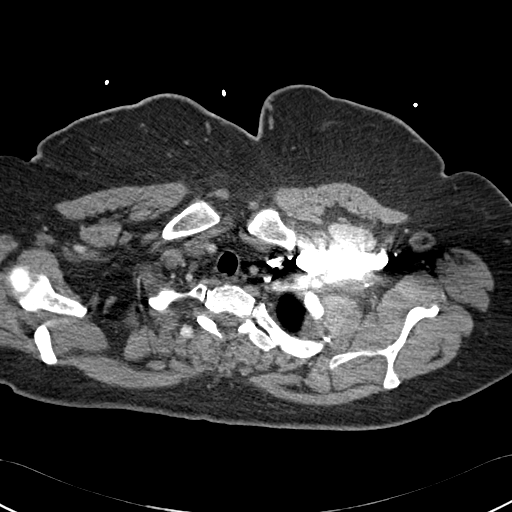
[im 210/222  lung]
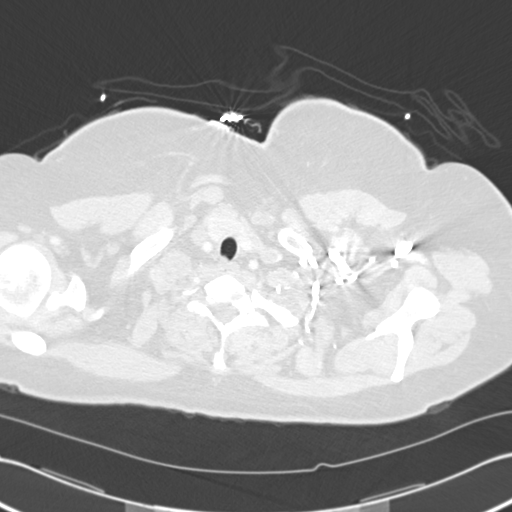

[Series 7: cor mpr 2.0 · coronal · 0.51mm/px · 3 of 138 slices shown]
[im 35/138  soft-tissue]
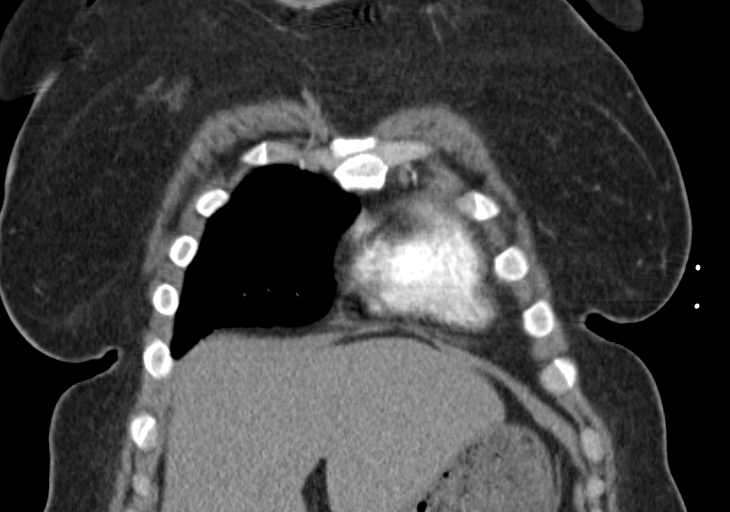
[im 69/138  soft-tissue]
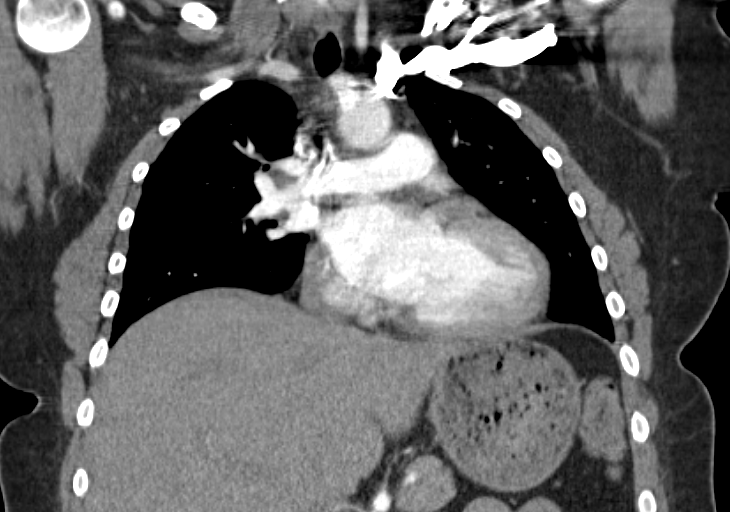
[im 103/138  soft-tissue]
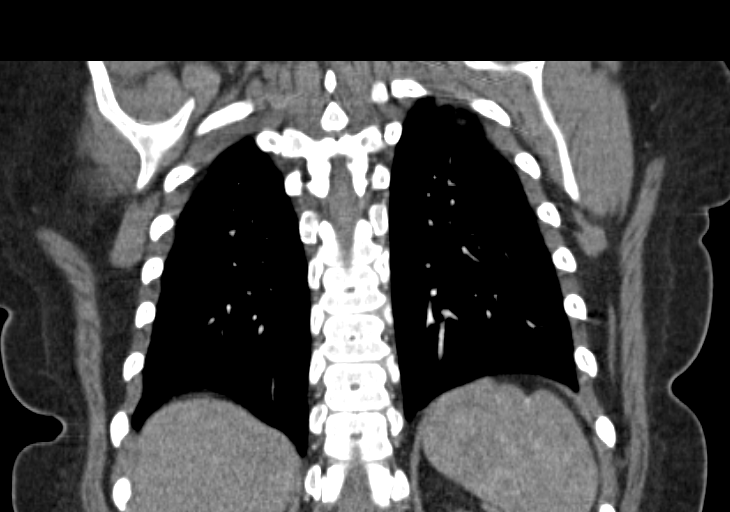

[18 of 46 positions shown; findings below may reference images not displayed]

FINDINGS: THORACIC INLET/BODY WALL:

No acute abnormality.

MEDIASTINUM:

Normal heart size. No pericardial effusion. When accounting for
areas affected by motion there is no indication of pulmonary
embolism. Negative aorta. Mild reactive appearing enlargement of
hilar lymph nodes.

LUNG WINDOWS:

Mosaic attenuation lungs attributed this small airways disease in
this patient with history of asthma. Minor atelectasis. There is no
edema, consolidation, effusion, or pneumothorax.

UPPER ABDOMEN:

No acute findings.

OSSEOUS:

No explanation for acute chest pain.

Review of the MIP images confirms the above findings.
IMPRESSION: 1. No evidence of pulmonary embolism.
2. Patchy air trapping presumably related to patient's history of
asthma.

## 2018-02-06 IMAGING — CR DG CHEST 2V
1 series · 2 of 2 positions shown · non-contrast
Comparison: None.

CLINICAL DATA: Chest pain

EXAM:
CHEST  2 VIEW

[Series 1: dg chest 2 view · 0.14mm/px · 2 of 2 slices shown]
[im 1/2]
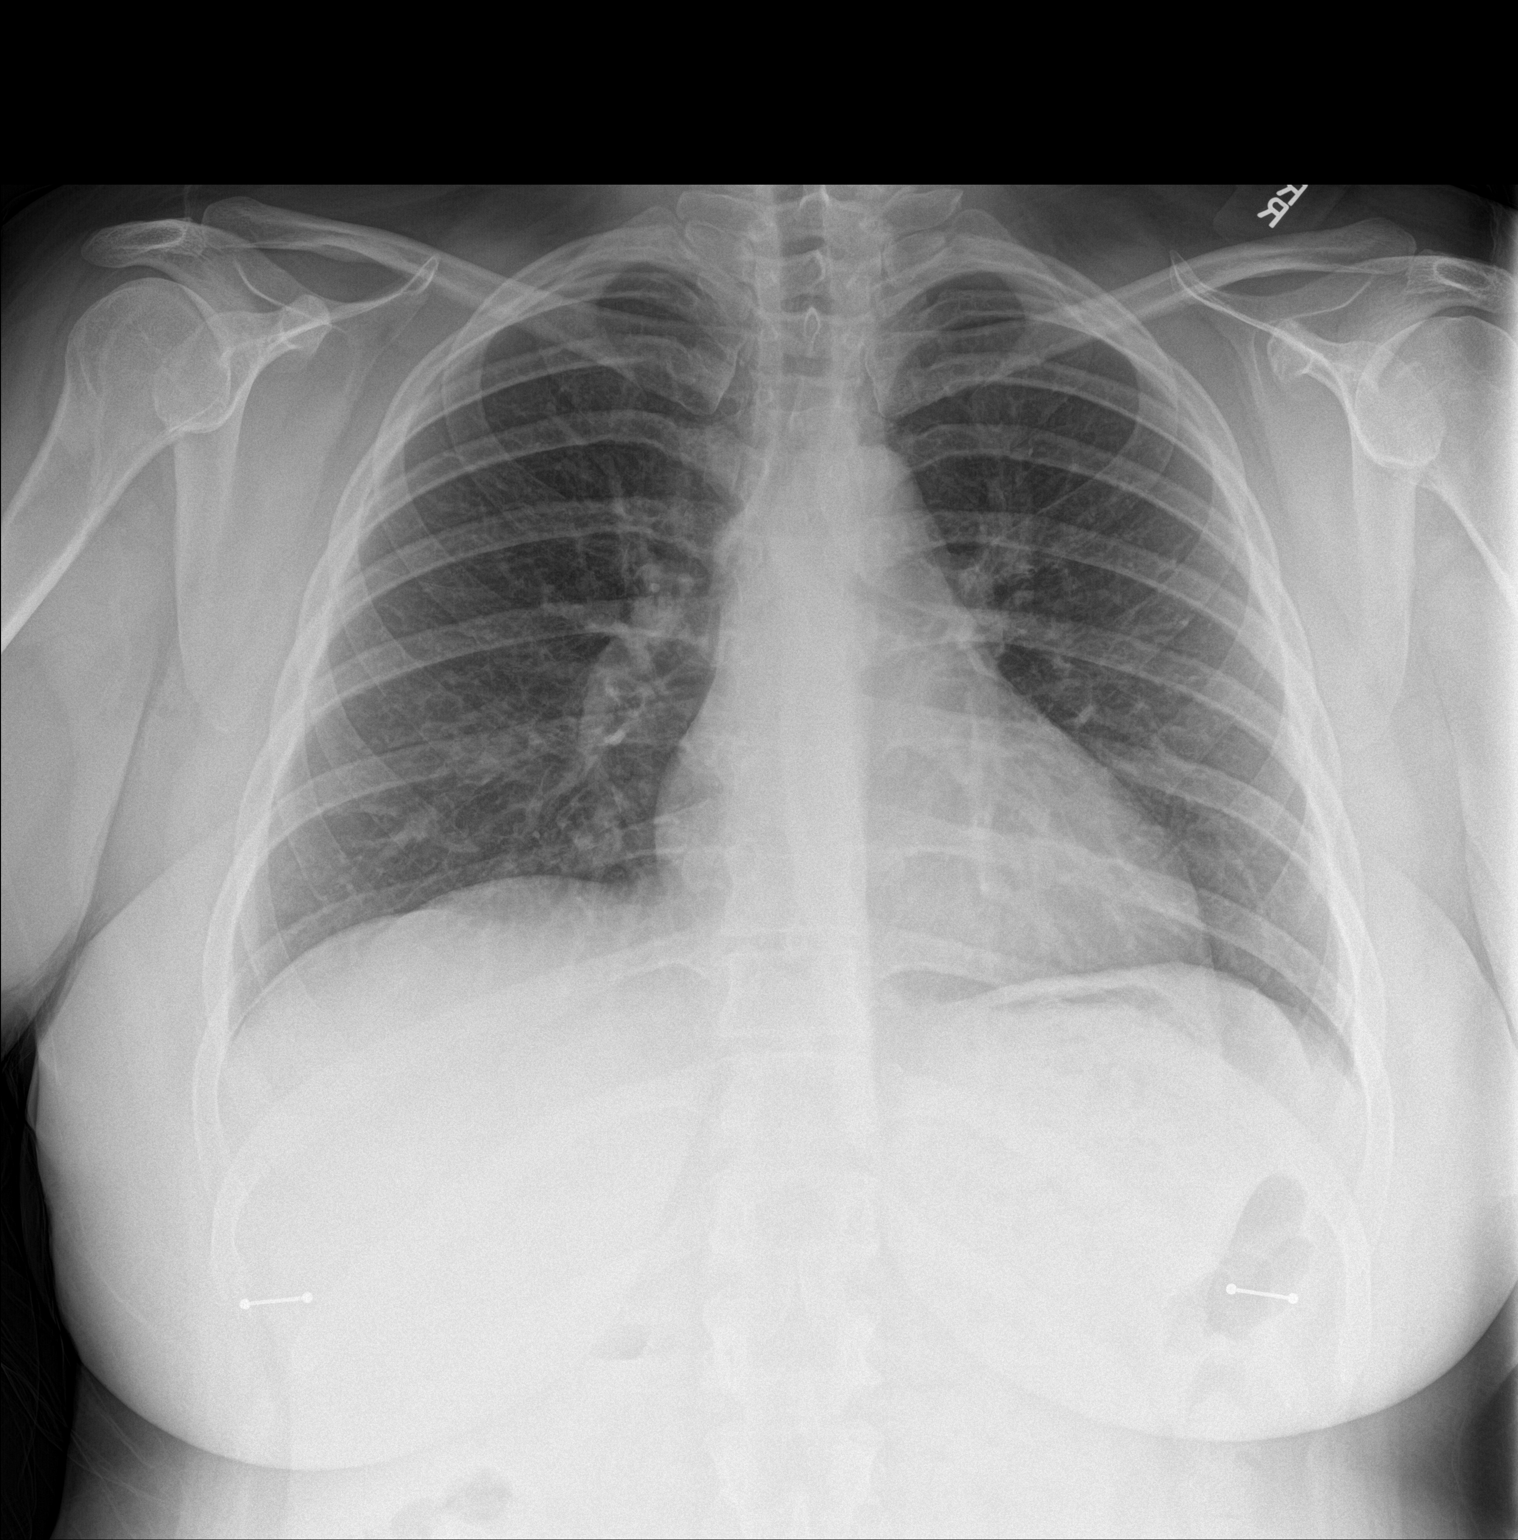
[im 2/2]
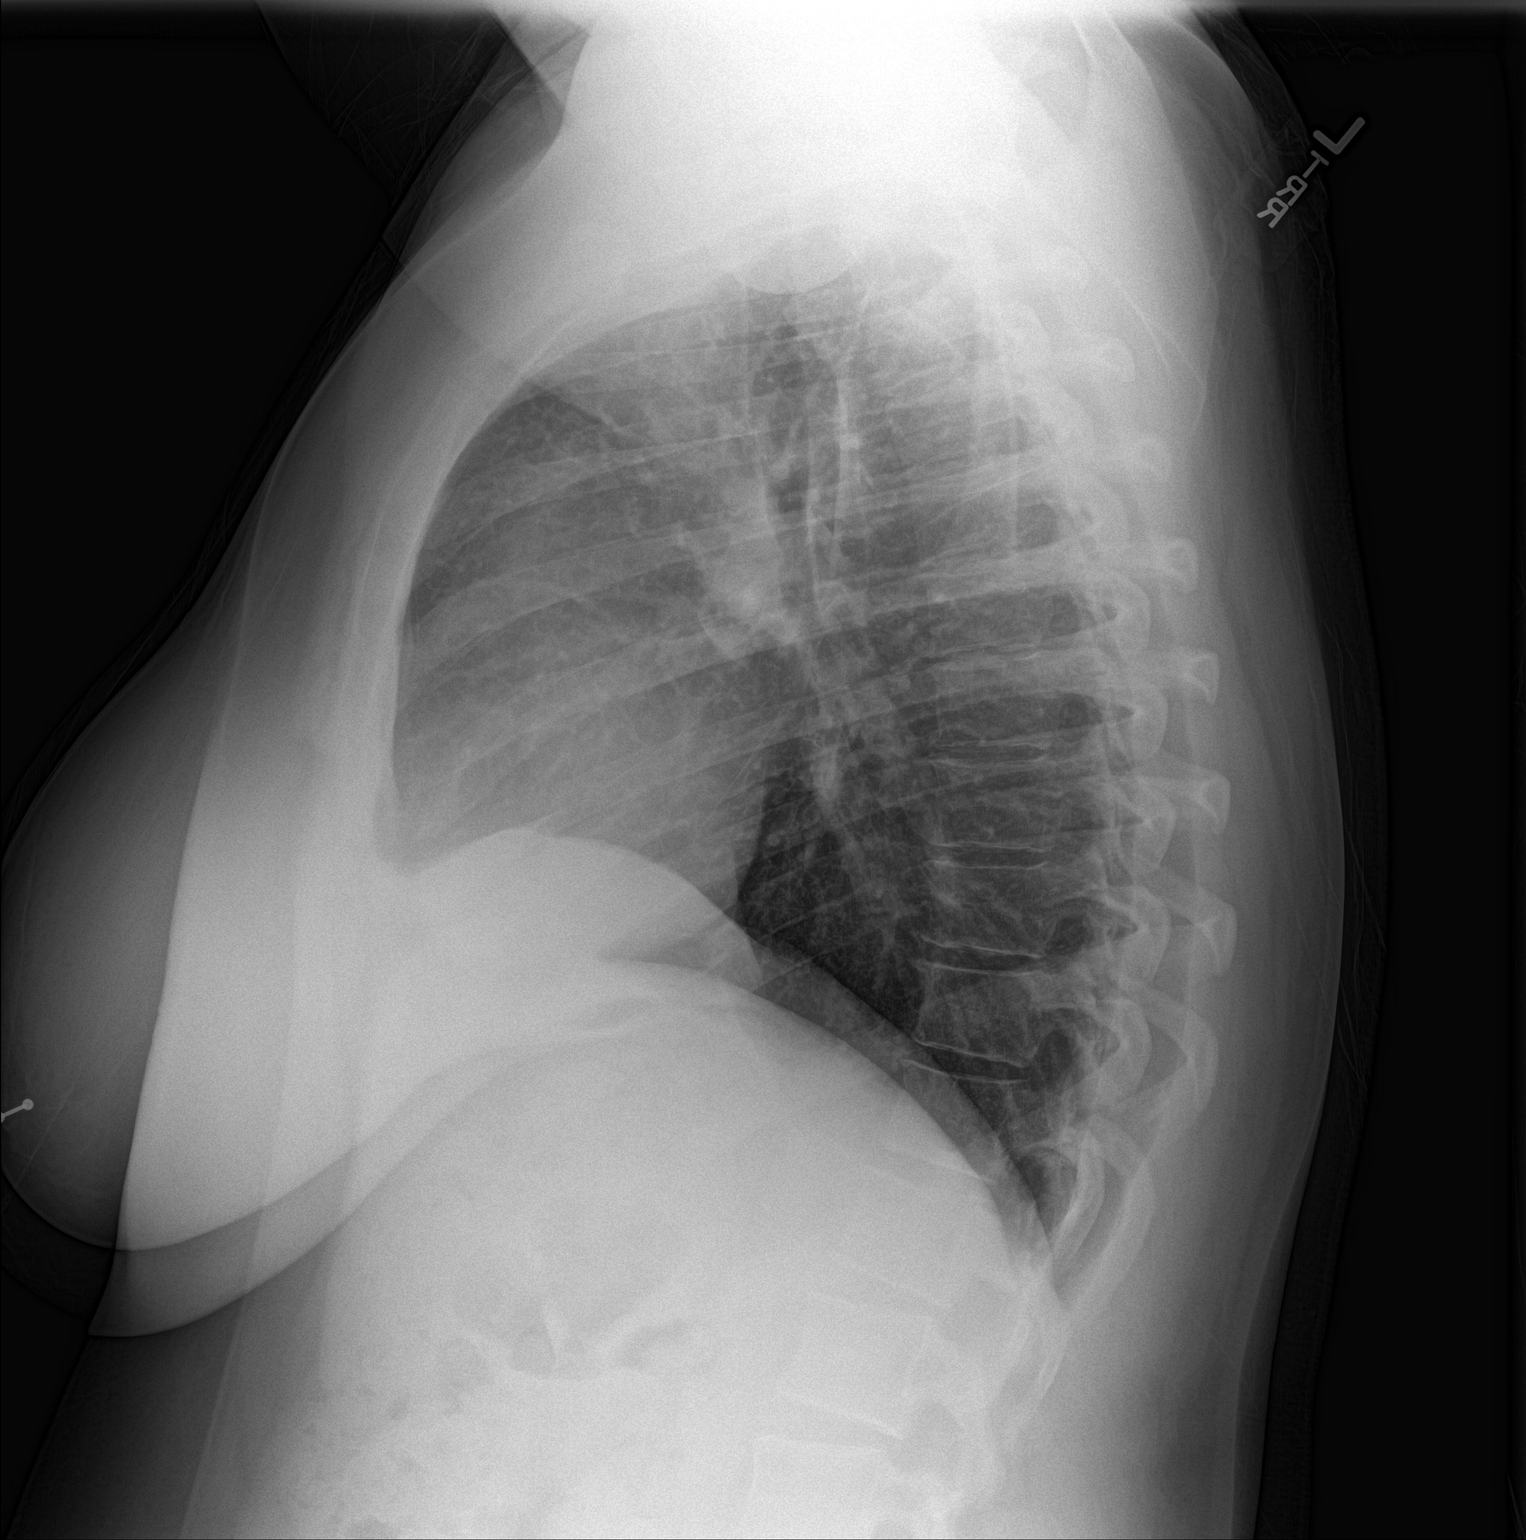

[2 of 2 positions shown; findings below may reference images not displayed]

FINDINGS: Normal heart size and mediastinal contours. No acute infiltrate or
edema. No effusion or pneumothorax. No osseous findings.
IMPRESSION: Negative chest.

## 2018-03-01 ENCOUNTER — Encounter: Payer: Self-pay | Admitting: Emergency Medicine

## 2018-03-01 ENCOUNTER — Other Ambulatory Visit: Payer: Self-pay

## 2018-03-01 DIAGNOSIS — J45909 Unspecified asthma, uncomplicated: Secondary | ICD-10-CM | POA: Insufficient documentation

## 2018-03-01 DIAGNOSIS — K0889 Other specified disorders of teeth and supporting structures: Secondary | ICD-10-CM | POA: Insufficient documentation

## 2018-03-01 DIAGNOSIS — F17210 Nicotine dependence, cigarettes, uncomplicated: Secondary | ICD-10-CM | POA: Insufficient documentation

## 2018-03-01 DIAGNOSIS — Z79899 Other long term (current) drug therapy: Secondary | ICD-10-CM | POA: Insufficient documentation

## 2018-03-01 NOTE — ED Triage Notes (Signed)
Patient ambulatory to triage with steady gait, without difficulty or distress noted; pt reports right ear/jaw pain x month

## 2018-03-02 ENCOUNTER — Emergency Department
Admission: EM | Admit: 2018-03-02 | Discharge: 2018-03-02 | Disposition: A | Discharge status: Home / Self Care | Payer: Medicaid Other | Attending: Emergency Medicine | Admitting: Emergency Medicine

## 2018-03-02 DIAGNOSIS — K0889 Other specified disorders of teeth and supporting structures: Secondary | ICD-10-CM

## 2018-03-02 MED ORDER — TRAMADOL HCL 50 MG PO TABS: 50.0000 mg | ORAL_TABLET | Freq: Four times a day (QID) | ORAL | 0 refills | Status: DC | PRN

## 2018-03-02 MED ORDER — PENICILLIN V POTASSIUM 500 MG PO TABS
500.0000 mg | ORAL_TABLET | Freq: Once | ORAL | Status: AC
  Administered 2018-03-02: 500 mg via ORAL
  Filled 2018-03-02: qty 2

## 2018-03-02 MED ORDER — PENICILLIN V POTASSIUM 250 MG PO TABS: 250.0000 mg | ORAL_TABLET | Freq: Four times a day (QID) | ORAL | 0 refills | Status: DC

## 2018-03-02 MED ORDER — TRAMADOL HCL 50 MG PO TABS
50.0000 mg | ORAL_TABLET | Freq: Once | ORAL | Status: AC
  Administered 2018-03-02: 50 mg via ORAL
  Filled 2018-03-02: qty 1

## 2018-03-02 NOTE — ED Provider Notes (Signed)
[facility] Emergency Department Provider Note   ____________________________________________    I have reviewed the triage vital signs and the nursing notes.   HISTORY  Chief Complaint Otalgia     HPI Brianna Ashley is a 32 y.o. female who presents with complaints of right lower jaw pain which is moderate and radiates to her right ear.  She reports the pain is been ongoing for nearly a month but is worsened over the last several days.  She does smoke cigarettes.  She has taken ibuprofen and Tylenol without improvement  Past Medical History:  Diagnosis Date  . Asthma   . Carpal tunnel syndrome   . DVT of lower extremity (deep venous thrombosis) (HCC)   . Factor 5 Leiden mutation, heterozygous (HCC)   . Migraine   . Seizures [facility](HCC)     Patient Active Problem List   Diagnosis Date Noted  . Factor V Leiden (HCC) 03/28/2016  . Acute deep vein thrombosis (DVT) of femoral vein of left lower extremity (HCC) 03/28/2016  . Lupus anticoagulant positive 03/28/2016  . DVT (deep venous thrombosis) (HCC) 02/02/2016    Past Surgical History:  Procedure Laterality Date  . ABSCESS DRAINAGE    . CESAREAN SECTION    . DILATION AND CURETTAGE OF UTERUS      Prior to Admission medications   Medication Sig Start Date End Date Taking? Authorizing Provider  acetaminophen (TYLENOL) 500 MG tablet Take 2 tablets (1,000 mg total) by mouth every 6 (six) hours as needed. 01/19/18 01/19/19  Merrily Brittleifenbark, Neil, MD  acetaminophen-codeine (TYLENOL #3) 300-30 MG tablet Take 1 tablet every 6 (six) hours as needed by mouth for moderate pain. 02/03/17   Menshew, Charlesetta IvoryJenise V Bacon, PA-C  albuterol (PROVENTIL HFA;VENTOLIN HFA) 108 (90 Base) MCG/ACT inhaler Inhale 2 puffs every 6 (six) hours as needed into the lungs for wheezing or shortness of breath. 02/03/17   Menshew, Charlesetta IvoryJenise V Bacon, PA-C  albuterol (PROVENTIL HFA;VENTOLIN HFA) 108 (90 Base) MCG/ACT inhaler Inhale 2 puffs into the  lungs every 6 (six) hours as needed for wheezing or shortness of breath. 06/23/17   Emily FilbertWilliams, Jonathan E, MD  apixaban (ELIQUIS) 5 MG TABS tablet Take 1 tablet (5 mg total) by mouth 2 (two) times daily. 01/19/18 02/18/18  Merrily Brittleifenbark, Neil, MD  benzonatate (TESSALON PERLES) 100 MG capsule Take 1-2 tabs TID prn cough 02/03/17   Menshew, Charlesetta IvoryJenise V Bacon, PA-C  citalopram (CELEXA) 40 MG tablet Take 1 tablet (40 mg total) by mouth daily. 02/04/16   Gouru, Deanna ArtisAruna, MD  clonazePAM (KLONOPIN) 1 MG tablet Take 1 tablet (1 mg total) by mouth 3 (three) times daily. 02/03/16   Ramonita LabGouru, Aruna, MD  docusate sodium (COLACE) 100 MG capsule Take 1 capsule (100 mg total) by mouth 2 (two) times daily as needed for mild constipation. 02/03/16   Gouru, Deanna ArtisAruna, MD  fluticasone (FLONASE) 50 MCG/ACT nasal spray Place 2 sprays daily into both nostrils. 02/03/17   Menshew, Charlesetta IvoryJenise V Bacon, PA-C  gabapentin (NEURONTIN) 100 MG capsule Take 1 capsule (100 mg total) by mouth 3 (three) times daily. 06/23/17 06/23/18  Emily FilbertWilliams, Jonathan E, MD  ibuprofen (ADVIL,MOTRIN) 600 MG tablet Take 1 tablet (600 mg total) by mouth every 8 (eight) hours as needed. 01/19/18   Merrily Brittleifenbark, Neil, MD  metroNIDAZOLE (FLAGYL) 500 MG tablet Take 1 tablet (500 mg total) by mouth 2 (two) times daily. 10/14/16   Cuthriell, Delorise RoyalsJonathan D, PA-C  ondansetron (ZOFRAN ODT) 4 MG disintegrating tablet Take 1 tablet (  4 mg total) by mouth every 8 (eight) hours as needed for nausea or vomiting. 06/23/17   Emily Filbert, MD  pantoprazole (PROTONIX) 40 MG tablet Take 1 tablet (40 mg total) by mouth daily. 06/23/17 06/23/18  Emily Filbert, MD  penicillin v potassium (VEETID) 250 MG tablet Take 1 tablet (250 mg total) by mouth 4 (four) times daily. 03/02/18   Jene Every, MD  predniSONE (STERAPRED UNI-PAK 21 TAB) 10 MG (21) TBPK tablet 6-day taper as directed. 02/03/17   Menshew, Charlesetta Ivory, PA-C  QUEtiapine (SEROQUEL) 25 MG tablet Take 1 tablet (25 mg total) by mouth 2  (two) times daily. 02/03/16   Gouru, Deanna Artis, MD  traMADol (ULTRAM) 50 MG tablet Take 1 tablet (50 mg total) by mouth every 6 (six) hours as needed. 03/02/18 03/02/19  Jene Every, MD     Allergies Ambien [zolpidem tartrate]; Ambien [zolpidem tartrate]; Clindamycin/lincomycin; Keflex [cephalexin]; Ritalin [methylphenidate hcl]; and Ritalin [methylphenidate hcl]  Family History  Problem Relation Age of Onset  . Factor V Leiden deficiency Mother        deceased of drug overdose    Social History Social History   Tobacco Use  . Smoking status: Current Every Day Smoker    Packs/day: 1.00    Years: 20.00    Pack years: 20.00    Types: Cigarettes  . Smokeless tobacco: Never Used  Substance Use Topics  . Alcohol use: No  . Drug use: No    Comment: last use New Year's eve.    Review of Systems  Constitutional: No fever/chills  ENT: Dental pain as above   Gastrointestinal: No nausea, no vomiting.    Skin: Negative for rash. Neurological: Negative for headaches     ____________________________________________   PHYSICAL EXAM:  VITAL SIGNS: ED Triage Vitals  Enc Vitals Group     BP 03/02/18 0014 135/75     Pulse Rate 03/02/18 0014 76     Resp 03/02/18 0014 18     Temp 03/02/18 0014 98.7 F (37.1 C)     Temp Source 03/02/18 0014 Oral     SpO2 03/02/18 0014 100 %     Weight 03/01/18 2346 101.2 kg (223 lb)     Height 03/01/18 2346 1.651 m (5\' 5" )     Head Circumference --      Peak Flow --      Pain Score 03/01/18 2346 10     Pain Loc --      Pain Edu? --      Excl. in GC? --      Constitutional: Alert and oriented. No acute distress. Eyes: Conjunctivae are normal.   Nose: No congestion/rhinnorhea. Mouth/Throat: Right lower molars are rotting, tender to palpation along the anterior gums mild redness, no abscess, no floor of mouth swelling, normal pharynx   Neurologic:  Normal speech and language. No gross focal neurologic deficits are appreciated.   Skin:   Skin is warm, dry and intact. No rash noted.   ____________________________________________   LABS (all labs ordered are listed, but only abnormal results are displayed)  Labs Reviewed - No data to display ____________________________________________  EKG   ____________________________________________  RADIOLOGY  None ____________________________________________   PROCEDURES  Procedure(s) performed: No  Procedures   Critical Care performed: No ____________________________________________   INITIAL IMPRESSION / ASSESSMENT AND PLAN / ED COURSE  Pertinent labs & imaging results that were available during my care of the patient were reviewed by me and considered in my medical  decision making (see chart for details).  Patient well-appearing overall, vital signs unremarkable, dental pain will be treated with penicillin, tramadol along with Tylenol and Motrin, outpatient follow-up with dentist   ____________________________________________   FINAL CLINICAL IMPRESSION(S) / ED DIAGNOSES  Final diagnoses:  Pain, dental      NEW MEDICATIONS STARTED DURING THIS VISIT:  Current Discharge Medication List    START taking these medications   Details  penicillin v potassium (VEETID) 250 MG tablet Take 1 tablet (250 mg total) by mouth 4 (four) times daily. Qty: 28 tablet, Refills: 0    traMADol (ULTRAM) 50 MG tablet Take 1 tablet (50 mg total) by mouth every 6 (six) hours as needed. Qty: 20 tablet, Refills: 0         Note:  This document was prepared using Dragon voice recognition software and may include unintentional dictation errors.    Jene Every, MD 03/02/18 [phone]

## 2018-03-02 NOTE — ED Notes (Signed)
ED Provider at bedside. 

## 2018-06-01 ENCOUNTER — Other Ambulatory Visit: Payer: Self-pay

## 2018-06-01 ENCOUNTER — Emergency Department
Admission: EM | Admit: 2018-06-01 | Discharge: 2018-06-01 | Disposition: A | Discharge status: Home / Self Care | Payer: Self-pay | Attending: Emergency Medicine | Admitting: Emergency Medicine

## 2018-06-01 ENCOUNTER — Encounter: Payer: Self-pay | Admitting: *Deleted

## 2018-06-01 DIAGNOSIS — D6851 Activated protein C resistance: Secondary | ICD-10-CM | POA: Insufficient documentation

## 2018-06-01 DIAGNOSIS — K921 Melena: Secondary | ICD-10-CM | POA: Insufficient documentation

## 2018-06-01 DIAGNOSIS — K529 Noninfective gastroenteritis and colitis, unspecified: Secondary | ICD-10-CM | POA: Insufficient documentation

## 2018-06-01 LAB — COMPREHENSIVE METABOLIC PANEL
ALBUMIN: 3.6 g/dL (ref 3.5–5.0)
ALK PHOS: 55 U/L (ref 38–126)
ALT: 10 U/L (ref 0–44)
ANION GAP: 7 (ref 5–15)
AST: 16 U/L (ref 15–41)
BUN: 7 mg/dL (ref 6–20)
CALCIUM: 8.7 mg/dL — AB (ref 8.9–10.3)
CO2: 22 mmol/L (ref 22–32)
Chloride: 109 mmol/L (ref 98–111)
Creatinine, Ser: 0.9 mg/dL (ref 0.44–1.00)
GFR calc Af Amer: >60 mL/min (ref >60)
GFR calc non Af Amer: >60 mL/min (ref >60)
GLUCOSE: 130 mg/dL — AB (ref 70–99)
Potassium: 3.5 mmol/L (ref 3.5–5.1)
SODIUM: 138 mmol/L (ref 135–145)
Total Bilirubin: 0.6 mg/dL (ref 0.3–1.2)
Total Protein: 6.1 g/dL — ABNORMAL LOW (ref 6.5–8.1)

## 2018-06-01 LAB — URINALYSIS, COMPLETE (UACMP) WITH MICROSCOPIC
Bacteria, UA: NONE SEEN
Bilirubin Urine: NEGATIVE
Glucose, UA: NEGATIVE mg/dL
Hgb urine dipstick: NEGATIVE
Ketones, ur: NEGATIVE mg/dL
LEUKOCYTE UA: NEGATIVE
Nitrite: NEGATIVE
PH: 6 (ref 5.0–8.0)
Protein, ur: NEGATIVE mg/dL
SPECIFIC GRAVITY, URINE: 1.02 (ref 1.005–1.030)

## 2018-06-01 LAB — CBC
HCT: 39 % (ref 36.0–46.0)
Hemoglobin: 13.3 g/dL (ref 12.0–15.0)
MCH: 29.8 pg (ref 26.0–34.0)
MCHC: 34.1 g/dL (ref 30.0–36.0)
MCV: 87.2 fL (ref 80.0–100.0)
PLATELETS: 268 10*3/uL (ref 150–400)
RBC: 4.47 MIL/uL (ref 3.87–5.11)
RDW: 13.3 % (ref 11.5–15.5)
WBC: 15.3 10*3/uL — ABNORMAL HIGH (ref 4.0–10.5)
nRBC: 0 % (ref 0.0–0.2)

## 2018-06-01 LAB — LIPASE, BLOOD: Lipase: 28 U/L (ref 11–51)

## 2018-06-01 LAB — POCT PREGNANCY, URINE: Preg Test, Ur: NEGATIVE

## 2018-06-01 MED ORDER — SODIUM CHLORIDE 0.9 % IV BOLUS: 1000.0000 mL | Freq: Once | INTRAVENOUS | Status: DC

## 2018-06-01 MED ORDER — SODIUM CHLORIDE 0.9% FLUSH: 3.0000 mL | Freq: Once | INTRAVENOUS | Status: DC

## 2018-06-01 MED ORDER — ONDANSETRON HCL 4 MG/2ML IJ SOLN
4.0000 mg | Freq: Once | INTRAMUSCULAR | Status: DC
  Filled 2018-06-01: qty 2

## 2018-06-01 MED ORDER — ONDANSETRON HCL 4 MG/2ML IJ SOLN: 4.0000 mg | Freq: Once | INTRAMUSCULAR | Status: DC

## 2018-06-01 MED ORDER — ONDANSETRON 4 MG PO TBDP
4.0000 mg | ORAL_TABLET | Freq: Once | ORAL | Status: AC
  Administered 2018-06-01: 4 mg via ORAL
  Filled 2018-06-01: qty 1

## 2018-06-01 NOTE — ED Triage Notes (Signed)
Pt reports abd pain with bloody diarrhea   Sx for 3 days.  Pt also reports nausea.  Pt alert

## 2018-06-01 NOTE — ED Provider Notes (Signed)
[facility] Emergency Department Provider Note  ____________________________________________   I have reviewed the triage vital signs and the nursing notes. Where available I have reviewed prior notes and, if possible and indicated, outside hospital notes.    HISTORY  Chief Complaint Abdominal Pain and Rectal Bleeding    HPI Brianna Ashley is a 33 y.o. female  Who has a history of noncompliance factor V Leiden not on her medications has been for "some while" refuses family how long she has been off, states she had diarrhea for last couple days.  Noticed a spot of blood on the toilet paper.  Not taking any of her Eliquis.  No chest pain or shortness of breath.  States that she has some nausea.  Also she is very hungry.  She wants to eat.  She has been drinking well.  She did have some nonbloody vomiting.  No melena.  She states that she does not want to stay for IV or IV fluids, she like a meal because she is hungry and she would like to go home.     Past Medical History:  Diagnosis Date  . Asthma   . Carpal tunnel syndrome   . DVT of lower extremity (deep venous thrombosis) (HCC)   . Factor 5 Leiden mutation, heterozygous (HCC)   . Migraine   . Seizures [facility])     Patient Active Problem List   Diagnosis Date Noted  . Factor V Leiden (HCC) 03/28/2016  . Acute deep vein thrombosis (DVT) of femoral vein of left lower extremity (HCC) 03/28/2016  . Lupus anticoagulant positive 03/28/2016  . DVT (deep venous thrombosis) (HCC) 02/02/2016    Past Surgical History:  Procedure Laterality Date  . ABSCESS DRAINAGE    . CESAREAN SECTION    . DILATION AND CURETTAGE OF UTERUS      Prior to Admission medications   Medication Sig Start Date End Date Taking? Authorizing Provider  acetaminophen (TYLENOL) 500 MG tablet Take 2 tablets (1,000 mg total) by mouth every 6 (six) hours as needed. 01/19/18 01/19/19  Merrily Brittle, MD  acetaminophen-codeine (TYLENOL #3)  300-30 MG tablet Take 1 tablet every 6 (six) hours as needed by mouth for moderate pain. 02/03/17   Menshew, Charlesetta Ivory, PA-C  albuterol (PROVENTIL HFA;VENTOLIN HFA) 108 (90 Base) MCG/ACT inhaler Inhale 2 puffs every 6 (six) hours as needed into the lungs for wheezing or shortness of breath. 02/03/17   Menshew, Charlesetta Ivory, PA-C  albuterol (PROVENTIL HFA;VENTOLIN HFA) 108 (90 Base) MCG/ACT inhaler Inhale 2 puffs into the lungs every 6 (six) hours as needed for wheezing or shortness of breath. 06/23/17   Emily Filbert, MD  apixaban (ELIQUIS) 5 MG TABS tablet Take 1 tablet (5 mg total) by mouth 2 (two) times daily. 01/19/18 02/18/18  Merrily Brittle, MD  benzonatate (TESSALON PERLES) 100 MG capsule Take 1-2 tabs TID prn cough 02/03/17   Menshew, Charlesetta Ivory, PA-C  citalopram (CELEXA) 40 MG tablet Take 1 tablet (40 mg total) by mouth daily. 02/04/16   Gouru, Deanna Artis, MD  clonazePAM (KLONOPIN) 1 MG tablet Take 1 tablet (1 mg total) by mouth 3 (three) times daily. 02/03/16   Ramonita Lab, MD  docusate sodium (COLACE) 100 MG capsule Take 1 capsule (100 mg total) by mouth 2 (two) times daily as needed for mild constipation. 02/03/16   Gouru, Deanna Artis, MD  fluticasone (FLONASE) 50 MCG/ACT nasal spray Place 2 sprays daily into both nostrils. 02/03/17   Menshew, [employer]  Marcelyn Bruins, PA-C  gabapentin (NEURONTIN) 100 MG capsule Take 1 capsule (100 mg total) by mouth 3 (three) times daily. 06/23/17 06/23/18  Emily Filbert, MD  ibuprofen (ADVIL,MOTRIN) 600 MG tablet Take 1 tablet (600 mg total) by mouth every 8 (eight) hours as needed. 01/19/18   Merrily Brittle, MD  metroNIDAZOLE (FLAGYL) 500 MG tablet Take 1 tablet (500 mg total) by mouth 2 (two) times daily. 10/14/16   Cuthriell, Delorise Royals, PA-C  ondansetron (ZOFRAN ODT) 4 MG disintegrating tablet Take 1 tablet (4 mg total) by mouth every 8 (eight) hours as needed for nausea or vomiting. 06/23/17   Emily Filbert, MD  pantoprazole (PROTONIX) 40 MG  tablet Take 1 tablet (40 mg total) by mouth daily. 06/23/17 06/23/18  Emily Filbert, MD  penicillin v potassium (VEETID) 250 MG tablet Take 1 tablet (250 mg total) by mouth 4 (four) times daily. 03/02/18   Jene Every, MD  predniSONE (STERAPRED UNI-PAK 21 TAB) 10 MG (21) TBPK tablet 6-day taper as directed. 02/03/17   Menshew, Charlesetta Ivory, PA-C  QUEtiapine (SEROQUEL) 25 MG tablet Take 1 tablet (25 mg total) by mouth 2 (two) times daily. 02/03/16   Gouru, Deanna Artis, MD  traMADol (ULTRAM) 50 MG tablet Take 1 tablet (50 mg total) by mouth every 6 (six) hours as needed. 03/02/18 03/02/19  Jene Every, MD    Allergies Ambien [zolpidem tartrate]; Ambien [zolpidem tartrate]; Clindamycin/lincomycin; Keflex [cephalexin]; Ritalin [methylphenidate hcl]; and Ritalin [methylphenidate hcl]  Family History  Problem Relation Age of Onset  . Factor V Leiden deficiency Mother        deceased of drug overdose    Social History Social History   Tobacco Use  . Smoking status: Current Every Day Smoker    Packs/day: 1.00    Years: 20.00    Pack years: 20.00    Types: Cigarettes  . Smokeless tobacco: Never Used  Substance Use Topics  . Alcohol use: No  . Drug use: No    Comment: last use New Year's eve.    Review of Systems Constitutional: No fever/chills Eyes: No visual changes. ENT: No sore throat. No stiff neck no neck pain Cardiovascular: Denies chest pain. Respiratory: Denies shortness of breath. Gastrointestinal:   no vomiting.  No diarrhea.  No constipation. Genitourinary: Negative for dysuria. Musculoskeletal: Negative lower extremity swelling Skin: Negative for rash. Neurological: Negative for severe headaches, focal weakness or numbness.   ____________________________________________   PHYSICAL EXAM:  VITAL SIGNS: ED Triage Vitals  Enc Vitals Group     BP 06/01/18 1920 131/61     Pulse Rate 06/01/18 1920 70     Resp 06/01/18 1920 20     Temp 06/01/18 1920 98.7 F  (37.1 C)     Temp Source 06/01/18 1920 Oral     SpO2 06/01/18 1920 95 %     Weight 06/01/18 1921 232 lb (105.2 kg)     Height 06/01/18 1921 5\' 4"  (1.626 m)     Head Circumference --      Peak Flow --      Pain Score 06/01/18 1920 10     Pain Loc --      Pain Edu? --      Excl. in GC? --     Constitutional: Alert and oriented. Well appearing and in no acute distress. Eyes: Conjunctivae are normal Head: Atraumatic HEENT: No congestion/rhinnorhea. Mucous membranes are moist.  Oropharynx non-erythematous Neck:   Nontender with no meningismus, no masses, no stridor  Cardiovascular: Normal rate, regular rhythm. Grossly normal heart sounds.  Good peripheral circulation. Respiratory: Normal respiratory effort.  No retractions. Lungs CTAB. Abdominal: Soft and nontender. No distention. No guarding no rebound Back:  There is no focal tenderness or step off.  there is no midline tenderness there are no lesions noted. there is no CVA tenderness  Musculoskeletal: No lower extremity tenderness, no upper extremity tenderness. No joint effusions, no DVT signs strong distal pulses no edema Neurologic:  Normal speech and language. No gross focal neurologic deficits are appreciated.  Skin:  Skin is warm, dry and intact. No rash noted. Psychiatric: Mood and affect are normal. Speech and behavior are normal.  ____________________________________________   LABS (all labs ordered are listed, but only abnormal results are displayed)  Labs Reviewed  COMPREHENSIVE METABOLIC PANEL - Abnormal; Notable for the following components:      Result Value   Glucose, Bld 130 (*)    Calcium 8.7 (*)    Total Protein 6.1 (*)    All other components within normal limits  CBC - Abnormal; Notable for the following components:   WBC 15.3 (*)    All other components within normal limits  URINALYSIS, COMPLETE (UACMP) WITH MICROSCOPIC - Abnormal; Notable for the following components:   Color, Urine YELLOW (*)     APPearance HAZY (*)    All other components within normal limits  C DIFFICILE QUICK SCREEN W PCR REFLEX  GASTROINTESTINAL PANEL BY PCR, STOOL (REPLACES STOOL CULTURE)  LIPASE, BLOOD  POC URINE PREG, ED  POCT PREGNANCY, URINE    Pertinent labs  results that were available during my care of the patient were reviewed by me and considered in my medical decision making (see chart for details). ____________________________________________  EKG  I personally interpreted any EKGs ordered by me or triage  ____________________________________________  RADIOLOGY  Pertinent labs & imaging results that were available during my care of the patient were reviewed by me and considered in my medical decision making (see chart for details). If possible, patient and/or family made aware of any abnormal findings.  No results found. ____________________________________________    PROCEDURES  Procedure(s) performed: None  Procedures  Critical Care performed: None  ____________________________________________   INITIAL IMPRESSION / ASSESSMENT AND PLAN / ED COURSE  Pertinent labs & imaging results that were available during my care of the patient were reviewed by me and considered in my medical decision making (see chart for details).  Female nurse chaperone present, patient declines DRE but does allow me to grossly inspect her rectum which shows old hemorrhoids but no active bleeding or other pathology such as abscess.  Again limited exam given patient constraints.  Patient here with diarrhea and vomiting, however her preoccupations with getting food.  Somewhat difficult patient, she is very controlling about what she will and will not allow.  She does not want a CT scan, she understands the risk benefits alternative to refusal CT scan in terms of limitation of work-up.  She does not want any IV fluid.  She states she has to go.  She does however consent to oral Zofran as she states she is  hungry and has not had any food for little while although she seems to be interested in food because "you do not have to pay for it here".  In any event, her abdomen is nonsurgical and she refuses further care.  I am also very concerned about the patient's noncompliance with her Eliquis.  She does have a doctor.  Of course reluctant to restart Eliquis on a patient who is presenting with GI bleeding given the limitations of our work-up here.  However, I have stressed to her the need to follow closely with her doctor and restart her Eliquis as she is at risk for blood clots.  She is in no acute distress otherwise, and as she is not interested in giving [place] a stool sample not have as rested and IV access not interested in CT scan not interested in DRE, understands the risk benefits and alternatives of all these decisions, and is requesting discharge as soon as possible, we will discharge her with return precautions follow-up given understood    ____________________________________________   FINAL CLINICAL IMPRESSION(S) / ED DIAGNOSES  Final diagnoses:  None      This chart was dictated using voice recognition software.  Despite best efforts to proofread,  errors can occur which can change meaning.      Jeanmarie Plant, MD 06/01/18 2233

## 2018-06-01 NOTE — ED Notes (Signed)
Pt given food tray, water, and apple juice verb okay per EDP McShane.

## 2018-06-01 NOTE — ED Notes (Signed)
Pt c/o strong gag reflex last few days; N/V/D; states noticed blood when wiping rectum last few days.

## 2018-06-01 NOTE — Discharge Instructions (Addendum)
You have more bleeding from your bottom or you feel worse in any way increasing increased pain or you change your mind about further work-up in the emergency room such as we discussed, please return to the emergency department.  Please call your doctor tomorrow without fail.  We feel that you should be on your Eliquis as soon as the bleeding resolves.  If you have any other new or worrisome symptoms like significant bleeding significant pain vomiting or fever or you have other concerns please return to the ER.

## 2018-06-01 NOTE — ED Notes (Signed)
Pt states that as of recently she hasn't been able to afford meals. States she has not eaten today because she couldn't pay for anything and is behind on rent. Told once EDP gives verbal okay for food this RN will get her food tray and drink.

## 2018-06-21 ENCOUNTER — Other Ambulatory Visit: Payer: Self-pay

## 2018-06-21 ENCOUNTER — Encounter: Payer: Self-pay | Admitting: Emergency Medicine

## 2018-06-21 ENCOUNTER — Emergency Department
Admission: EM | Admit: 2018-06-21 | Discharge: 2018-06-21 | Disposition: A | Discharge status: Home / Self Care | Payer: Self-pay | Attending: Emergency Medicine | Admitting: Emergency Medicine

## 2018-06-21 ENCOUNTER — Emergency Department: Payer: Self-pay

## 2018-06-21 DIAGNOSIS — I82602 Acute embolism and thrombosis of unspecified veins of left upper extremity: Secondary | ICD-10-CM | POA: Insufficient documentation

## 2018-06-21 DIAGNOSIS — Z79899 Other long term (current) drug therapy: Secondary | ICD-10-CM | POA: Insufficient documentation

## 2018-06-21 DIAGNOSIS — J45909 Unspecified asthma, uncomplicated: Secondary | ICD-10-CM | POA: Insufficient documentation

## 2018-06-21 DIAGNOSIS — I8290 Acute embolism and thrombosis of unspecified vein: Secondary | ICD-10-CM

## 2018-06-21 DIAGNOSIS — Z7901 Long term (current) use of anticoagulants: Secondary | ICD-10-CM | POA: Insufficient documentation

## 2018-06-21 DIAGNOSIS — R52 Pain, unspecified: Secondary | ICD-10-CM

## 2018-06-21 MED ORDER — TRAMADOL HCL 50 MG PO TABS: 50.0000 mg | ORAL_TABLET | Freq: Four times a day (QID) | ORAL | 0 refills | Status: AC | PRN

## 2018-06-21 MED ORDER — OXYCODONE-ACETAMINOPHEN 5-325 MG PO TABS
1.0000 | ORAL_TABLET | Freq: Once | ORAL | Status: AC
  Administered 2018-06-21: 1 via ORAL
  Filled 2018-06-21: qty 1

## 2018-06-21 MED ORDER — APIXABAN 5 MG PO TABS: 5.0000 mg | ORAL_TABLET | Freq: Two times a day (BID) | ORAL | 2 refills | Status: DC

## 2018-06-21 NOTE — ED Notes (Signed)
Left upper arm with red, inflamed streak above elbow. Pain 10/10

## 2018-06-21 NOTE — ED Notes (Signed)
Report received from Stanton, Charity fundraiser. Pt care assumed at this time, pt provided with a meal tray at this time.

## 2018-06-21 NOTE — ED Notes (Signed)
Pt verbalized understanding of d/c instructions, RX, and f/u care. No further questions at this time. Pt ambulatory to the exit with steady gait  

## 2018-06-21 NOTE — ED Triage Notes (Addendum)
Pt c/o LFT upper arm pain after giving plasma x3days ago. Arm appears swollen and tender to touch. VSS . Pt thinks she may have blood clot, hx of dvt

## 2018-06-21 NOTE — Discharge Instructions (Addendum)
As we discussed please take your pain medication as needed, only as prescribed.  Please use ibuprofen as needed as written on the box for discomfort.  Please keep your arm elevated and use warm compresses as needed for discomfort.  Please begin taking your blood thinner once again as prescribed.  Please follow-up with your doctor in the next 1 week for recheck/reevaluation.  You will likely need a repeat ultrasound in the next 2 weeks to check for clot improvement or worsening.  Return to the emergency department immediately for any chest pain or trouble breathing.

## 2018-06-21 NOTE — ED Provider Notes (Signed)
[facility] Emergency Department Provider Note  Time seen: 6:53 PM  I have reviewed the triage vital signs and the nursing notes.   HISTORY  Chief Complaint Arm Injury    HPI Brianna Ashley is a 33 y.o. female with a past medical history of asthma, factor V Leiden, DVT, supposed to be on anticoagulation but is not currently taking, presents to the emergency department for left arm pain and swelling.  According to the patient she donates plasma twice a week.  Patient states she donated 3 days ago from the left upper extremity.  Patient states it took twice as long as it normally does for her to donate.  Patient states over the past 3 days she has noticed progressive pain and redness to the area as well extending up her bicep.  Patient has a history of factor V Leiden as well as DVTs in the past since 2008.  States she is supposed to be on blood thinners but cannot afford them so she has not been taking anything.  Patient denies any chest pain or shortness of breath.   Past Medical History:  Diagnosis Date  . Asthma   . Carpal tunnel syndrome   . DVT of lower extremity (deep venous thrombosis) (HCC)   . Factor 5 Leiden mutation, heterozygous (HCC)   . Migraine   . Seizures [facility])     Patient Active Problem List   Diagnosis Date Noted  . Factor V Leiden (HCC) 03/28/2016  . Acute deep vein thrombosis (DVT) of femoral vein of left lower extremity (HCC) 03/28/2016  . Lupus anticoagulant positive 03/28/2016  . DVT (deep venous thrombosis) (HCC) 02/02/2016    Past Surgical History:  Procedure Laterality Date  . ABSCESS DRAINAGE    . CESAREAN SECTION    . DILATION AND CURETTAGE OF UTERUS      Prior to Admission medications   Medication Sig Start Date End Date Taking? Authorizing Provider  acetaminophen (TYLENOL) 500 MG tablet Take 2 tablets (1,000 mg total) by mouth every 6 (six) hours as needed. 01/19/18 01/19/19  Merrily Brittle, MD  acetaminophen-codeine  (TYLENOL #3) 300-30 MG tablet Take 1 tablet every 6 (six) hours as needed by mouth for moderate pain. 02/03/17   Menshew, Charlesetta Ivory, PA-C  albuterol (PROVENTIL HFA;VENTOLIN HFA) 108 (90 Base) MCG/ACT inhaler Inhale 2 puffs every 6 (six) hours as needed into the lungs for wheezing or shortness of breath. 02/03/17   Menshew, Charlesetta Ivory, PA-C  albuterol (PROVENTIL HFA;VENTOLIN HFA) 108 (90 Base) MCG/ACT inhaler Inhale 2 puffs into the lungs every 6 (six) hours as needed for wheezing or shortness of breath. 06/23/17   Emily Filbert, MD  apixaban (ELIQUIS) 5 MG TABS tablet Take 1 tablet (5 mg total) by mouth 2 (two) times daily. 01/19/18 02/18/18  Merrily Brittle, MD  benzonatate (TESSALON PERLES) 100 MG capsule Take 1-2 tabs TID prn cough 02/03/17   Menshew, Charlesetta Ivory, PA-C  citalopram (CELEXA) 40 MG tablet Take 1 tablet (40 mg total) by mouth daily. 02/04/16   Gouru, Deanna Artis, MD  clonazePAM (KLONOPIN) 1 MG tablet Take 1 tablet (1 mg total) by mouth 3 (three) times daily. 02/03/16   Ramonita Lab, MD  docusate sodium (COLACE) 100 MG capsule Take 1 capsule (100 mg total) by mouth 2 (two) times daily as needed for mild constipation. 02/03/16   Gouru, Deanna Artis, MD  fluticasone (FLONASE) 50 MCG/ACT nasal spray Place 2 sprays daily into both nostrils. 02/03/17  Menshew, Charlesetta Ivory, PA-C  gabapentin (NEURONTIN) 100 MG capsule Take 1 capsule (100 mg total) by mouth 3 (three) times daily. 06/23/17 06/23/18  Emily Filbert, MD  ibuprofen (ADVIL,MOTRIN) 600 MG tablet Take 1 tablet (600 mg total) by mouth every 8 (eight) hours as needed. 01/19/18   Merrily Brittle, MD  metroNIDAZOLE (FLAGYL) 500 MG tablet Take 1 tablet (500 mg total) by mouth 2 (two) times daily. 10/14/16   Cuthriell, Delorise Royals, PA-C  ondansetron (ZOFRAN ODT) 4 MG disintegrating tablet Take 1 tablet (4 mg total) by mouth every 8 (eight) hours as needed for nausea or vomiting. 06/23/17   Emily Filbert, MD  pantoprazole  (PROTONIX) 40 MG tablet Take 1 tablet (40 mg total) by mouth daily. 06/23/17 06/23/18  Emily Filbert, MD  penicillin v potassium (VEETID) 250 MG tablet Take 1 tablet (250 mg total) by mouth 4 (four) times daily. 03/02/18   Jene Every, MD  predniSONE (STERAPRED UNI-PAK 21 TAB) 10 MG (21) TBPK tablet 6-day taper as directed. 02/03/17   Menshew, Charlesetta Ivory, PA-C  QUEtiapine (SEROQUEL) 25 MG tablet Take 1 tablet (25 mg total) by mouth 2 (two) times daily. 02/03/16   Gouru, Deanna Artis, MD  traMADol (ULTRAM) 50 MG tablet Take 1 tablet (50 mg total) by mouth every 6 (six) hours as needed. 03/02/18 03/02/19  Jene Every, MD    Allergies  Allergen Reactions  . Ambien [Zolpidem Tartrate] Other (See Comments)    death  . Ambien [Zolpidem Tartrate]   . Clindamycin/Lincomycin Nausea And Vomiting  . Keflex [Cephalexin] Nausea And Vomiting  . Ritalin [Methylphenidate Hcl] Other (See Comments)    death  . Ritalin [Methylphenidate Hcl]     Family History  Problem Relation Age of Onset  . Factor V Leiden deficiency Mother        deceased of drug overdose    Social History Social History   Tobacco Use  . Smoking status: Current Every Day Smoker    Packs/day: 1.00    Years: 20.00    Pack years: 20.00    Types: Cigarettes  . Smokeless tobacco: Never Used  Substance Use Topics  . Alcohol use: No  . Drug use: No    Comment: last use New Year's eve.    Review of Systems Constitutional: Negative for fever. Cardiovascular: Negative for chest pain. Respiratory: Negative for shortness of breath. Gastrointestinal: Negative for abdominal pain, vomiting  Musculoskeletal: Left arm redness, pain, and swelling Skin: Negative for skin complaints  Neurological: Negative for headache All other ROS negative  ____________________________________________   PHYSICAL EXAM:  VITAL SIGNS: ED Triage Vitals  Enc Vitals Group     BP 06/21/18 1719 111/71     Pulse Rate 06/21/18 1719 71     Resp  06/21/18 1719 16     Temp 06/21/18 1719 98.6 F (37 C)     Temp Source 06/21/18 1719 Oral     SpO2 06/21/18 1719 98 %     Weight --      Height --      Head Circumference --      Peak Flow --      Pain Score 06/21/18 1720 10     Pain Loc --      Pain Edu? --      Excl. in GC? --     Constitutional: Alert and oriented. Well appearing and in no distress. Eyes: Normal exam ENT   Head: Normocephalic and atraumatic.   Mouth/Throat:  Mucous membranes are moist. Cardiovascular: Normal rate, regular rhythm Respiratory: Normal respiratory effort without tachypnea nor retractions. Breath sounds are clear Gastrointestinal: Soft and nontender. No distention.   Musculoskeletal: Patient has moderate tenderness palpation of the left bicep, with mild erythema from antecubital fossa extending for 5 inches proximally.  No forearm tenderness to palpation, 2+ radial pulse. Neurologic:  Normal speech and language. No gross focal neurologic deficits Skin:  Skin is warm, dry and intact.  Psychiatric: Mood and affect are normal  ____________________________________________    RADIOLOGY  IMPRESSION: Evidence of occlusive thrombus within the LEFT cephalic vein.  The other upper extremity veins including the brachial, basilic, axillary, and subclavian vein, as well as internal jugular vein, appear patent without thrombus.  ____________________________________________   INITIAL IMPRESSION / ASSESSMENT AND PLAN / ED COURSE  Pertinent labs & imaging results that were available during my care of the patient were reviewed by me and considered in my medical decision making (see chart for details).  Patient presents to the emergency department for left arm discomfort.  Differential would include DVT, superficial thrombophlebitis, cellulitis.  We will obtain an ultrasound to further evaluate, we will dose 1 Percocet while awaiting ultrasound results.  Patient agreeable to plan of care.  Patient's  ultrasound has resulted positive for occlusive thrombus in the left cephalic vein.  Given that this is occlusive thrombus and the patient has a history of factor V Leiden and is supposed to be on blood thinners already I believe it is reasonable to start the patient back on Eliquis 5 mg twice daily have the patient use warm compresses, elevation.  We will write for short course of tramadol I discussed using NSAIDs at home.  Patient will need to follow-up with her doctor for recheck in 7 days and possible repeat ultrasound in 2 weeks.  ____________________________________________   FINAL CLINICAL IMPRESSION(S) / ED DIAGNOSES  Venous thrombosis   Minna Antis, MD 06/21/18 1950

## 2018-06-24 ENCOUNTER — Telehealth: Payer: Self-pay

## 2018-06-24 NOTE — Telephone Encounter (Signed)
Patient called to the ED because she was seen on 3/30 and given a prescription for Eliquis and and Eliquis 30 day free trial card.  Patient calling reporting that the card has already been used.  Patient reports that she has been on Eliquis for a long time and it was previously paid for by Medicaid but she no longer has Medicaid.  RNCM explained to the patient that the card only works for patient's just starting Eliquis.  RNCM informed patient of Medication Management where uninsured patient's can get their prescriptions for free.  Also informed patient of indigent clinics in the area because she reports that she has not seen a PCP in 2 years.  While on the phone with patient she reports that her arm is throbbing more and her face on one side is numb.  RNCM instructed patient that she needs to come back to the ED.  If she cannot find transportation she needs to call EMS.  RNCM unsure if patient will comply she reports she needs to call her landlord and her boyfriend.  Number and fax given to patient for Medication Management.

## 2018-09-09 ENCOUNTER — Telehealth: Payer: Self-pay | Admitting: Pharmacy Technician

## 2018-09-09 NOTE — Telephone Encounter (Signed)
Patient failed to provide requested financial documentation.  Financial documentation is required in order to determine patient's eligibility for [facility]'s program.  No additional medication assistance will be provided until patient provides requested financial documentation.  Patient notified by letter.  Vivia Rosenburg, CPhT Recertification Specialist 

## 2019-04-23 ENCOUNTER — Emergency Department: Payer: Self-pay

## 2019-04-23 ENCOUNTER — Encounter: Payer: Self-pay | Admitting: Radiology

## 2019-04-23 ENCOUNTER — Emergency Department
Admission: EM | Admit: 2019-04-23 | Discharge: 2019-04-23 | Disposition: A | Discharge status: Home / Self Care | Payer: Self-pay | Attending: Emergency Medicine | Admitting: Emergency Medicine

## 2019-04-23 ENCOUNTER — Encounter: Payer: Self-pay | Admitting: Physician Assistant

## 2019-04-23 ENCOUNTER — Other Ambulatory Visit: Payer: Self-pay

## 2019-04-23 DIAGNOSIS — J45909 Unspecified asthma, uncomplicated: Secondary | ICD-10-CM | POA: Insufficient documentation

## 2019-04-23 DIAGNOSIS — Z7901 Long term (current) use of anticoagulants: Secondary | ICD-10-CM | POA: Insufficient documentation

## 2019-04-23 DIAGNOSIS — L0231 Cutaneous abscess of buttock: Secondary | ICD-10-CM | POA: Insufficient documentation

## 2019-04-23 DIAGNOSIS — F17210 Nicotine dependence, cigarettes, uncomplicated: Secondary | ICD-10-CM | POA: Insufficient documentation

## 2019-04-23 DIAGNOSIS — Z79899 Other long term (current) drug therapy: Secondary | ICD-10-CM | POA: Insufficient documentation

## 2019-04-23 LAB — URINALYSIS, COMPLETE (UACMP) WITH MICROSCOPIC
Bilirubin Urine: NEGATIVE
Glucose, UA: NEGATIVE mg/dL
Hgb urine dipstick: NEGATIVE
Ketones, ur: NEGATIVE mg/dL
Leukocytes,Ua: NEGATIVE
Nitrite: NEGATIVE
Protein, ur: NEGATIVE mg/dL
Specific Gravity, Urine: 1.003 — ABNORMAL LOW (ref 1.005–1.030)
pH: 6 (ref 5.0–8.0)

## 2019-04-23 LAB — CBC WITH DIFFERENTIAL/PLATELET
Abs Immature Granulocytes: 0.05 10*3/uL (ref 0.00–0.07)
Basophils Absolute: 0.1 10*3/uL (ref 0.0–0.1)
Basophils Relative: 0 %
Eosinophils Absolute: 0.3 10*3/uL (ref 0.0–0.5)
Eosinophils Relative: 2 %
HCT: 31.8 % — ABNORMAL LOW (ref 36.0–46.0)
Hemoglobin: 10.4 g/dL — ABNORMAL LOW (ref 12.0–15.0)
Immature Granulocytes: 0 %
Lymphocytes Relative: 27 %
Lymphs Abs: 3.4 10*3/uL (ref 0.7–4.0)
MCH: 27.7 pg (ref 26.0–34.0)
MCHC: 32.7 g/dL (ref 30.0–36.0)
MCV: 84.6 fL (ref 80.0–100.0)
Monocytes Absolute: 1 10*3/uL (ref 0.1–1.0)
Monocytes Relative: 8 %
Neutro Abs: 8 10*3/uL — ABNORMAL HIGH (ref 1.7–7.7)
Neutrophils Relative %: 63 %
Platelets: 406 10*3/uL — ABNORMAL HIGH (ref 150–400)
RBC: 3.76 MIL/uL — ABNORMAL LOW (ref 3.87–5.11)
RDW: 14.6 % (ref 11.5–15.5)
WBC: 12.8 10*3/uL — ABNORMAL HIGH (ref 4.0–10.5)
nRBC: 0 % (ref 0.0–0.2)

## 2019-04-23 LAB — PREGNANCY, URINE: Preg Test, Ur: NEGATIVE

## 2019-04-23 LAB — BASIC METABOLIC PANEL
Anion gap: 7 (ref 5–15)
BUN: 10 mg/dL (ref 6–20)
CO2: 24 mmol/L (ref 22–32)
Calcium: 8.6 mg/dL — ABNORMAL LOW (ref 8.9–10.3)
Chloride: 108 mmol/L (ref 98–111)
Creatinine, Ser: 0.78 mg/dL (ref 0.44–1.00)
GFR calc Af Amer: >60 mL/min (ref >60)
GFR calc non Af Amer: >60 mL/min (ref >60)
Glucose, Bld: 94 mg/dL (ref 70–99)
Potassium: 3.9 mmol/L (ref 3.5–5.1)
Sodium: 139 mmol/L (ref 135–145)

## 2019-04-23 LAB — PROTIME-INR
INR: 1 (ref 0.8–1.2)
Prothrombin Time: 12.6 seconds (ref 11.4–15.2)

## 2019-04-23 MED ORDER — SULFAMETHOXAZOLE-TRIMETHOPRIM 800-160 MG PO TABS
1.0000 | ORAL_TABLET | Freq: Once | ORAL | Status: AC
  Administered 2019-04-23: 1 via ORAL
  Filled 2019-04-23: qty 1

## 2019-04-23 MED ORDER — OXYCODONE HCL 5 MG PO TABS: 5.0000 mg | ORAL_TABLET | Freq: Three times a day (TID) | ORAL | 0 refills | Status: DC | PRN

## 2019-04-23 MED ORDER — IOHEXOL 300 MG/ML  SOLN
125.0000 mL | Freq: Once | INTRAMUSCULAR | Status: AC | PRN
  Administered 2019-04-23: 19:00:00 150 mL via INTRAVENOUS
  Filled 2019-04-23: qty 125

## 2019-04-23 MED ORDER — HYDROMORPHONE HCL 1 MG/ML IJ SOLN
1.0000 mg | Freq: Once | INTRAMUSCULAR | Status: AC
  Administered 2019-04-23: 1 mg via INTRAVENOUS
  Filled 2019-04-23: qty 1

## 2019-04-23 MED ORDER — ONDANSETRON 4 MG PO TBDP
4.0000 mg | ORAL_TABLET | Freq: Once | ORAL | Status: AC
  Administered 2019-04-23: 4 mg via ORAL
  Filled 2019-04-23: qty 1

## 2019-04-23 MED ORDER — OXYCODONE-ACETAMINOPHEN 5-325 MG PO TABS
1.0000 | ORAL_TABLET | Freq: Once | ORAL | Status: AC
  Administered 2019-04-23: 1 via ORAL
  Filled 2019-04-23: qty 1

## 2019-04-23 MED ORDER — SULFAMETHOXAZOLE-TRIMETHOPRIM 800-160 MG PO TABS: 1.0000 | ORAL_TABLET | Freq: Two times a day (BID) | ORAL | 0 refills | Status: AC

## 2019-04-23 NOTE — Consult Note (Signed)
Subjective:   CC: Right gluteal abscess  HPI:  Brianna Ashley is a 34 y.o. female who was consulted by [facility] for evaluation of above.  First noted 2 days ago.  Symptoms include: Pain is sharp, worsening.  Exacerbated by pressure.  Alleviated by nothing specific.  Associated with growing lump in the area of previous I&D, which required multiple trips to the operating room in the prolonged wound care course requiring routine care through the wound care center.  History of factor V Ley[place] but cannot afford anticoagulation so currently not taking any.  Past Medical History:  has a past medical history of Asthma, Carpal tunnel syndrome, DVT of lower extremity (deep venous thrombosis) ([place]), Factor 5 Leiden mutation, heterozygous ([place]), Migraine, and Seizures ([place]).  Past Surgical History:  has a past surgical history that includes Dilation and curettage of uterus; Cesarean section; and Abscess drainage.  Family History: family history includes Factor V Leiden deficiency in her mother.  Social History:  reports that she has been smoking cigarettes. She has a 20.00 pack-year smoking history. She has never used smokeless tobacco. She reports that she does not drink alcohol or use drugs.  Current Medications:  acetaminophen-codeine (TYLENOL #3) 300-30 MG tablet Take 1 tablet every 6 (six) hours as needed by mouth for moderate pain. Menshew, Dannielle Karvonen, PA-C Needs Review  albuterol (PROVENTIL HFA;VENTOLIN HFA) 108 (90 Base) MCG/ACT inhaler Inhale 2 puffs every 6 (six) hours as needed into the lungs for wheezing or shortness of breath. Menshew, Dannielle Karvonen, PA-C Needs Review  albuterol (PROVENTIL HFA;VENTOLIN HFA) 108 (90 Base) MCG/ACT inhaler Inhale 2 puffs into the lungs every 6 (six) hours as needed for wheezing or shortness of breath. Earleen Newport, MD Needs Review  apixaban (ELIQUIS) 5 MG TABS tablet Take 1 tablet (5 mg total) by mouth 2 (two) times daily. Harvest Dark, MD  Needs Review  benzonatate (TESSALON PERLES) 100 MG capsule Take 1-2 tabs TID prn cough Menshew, Dannielle Karvonen, PA-C Needs Review  citalopram (CELEXA) 40 MG tablet Take 1 tablet (40 mg total) by mouth daily. Nicholes Mango, MD Needs Review  clonazePAM (KLONOPIN) 1 MG tablet Take 1 tablet (1 mg total) by mouth 3 (three) times daily. Nicholes Mango, MD Needs Review  docusate sodium (COLACE) 100 MG capsule Take 1 capsule (100 mg total) by mouth 2 (two) times daily as needed for mild constipation. Nicholes Mango, MD Needs Review  fluticasone (FLONASE) 50 MCG/ACT nasal spray Place 2 sprays daily into both nostrils. Menshew, Dannielle Karvonen, PA-C Needs Review  gabapentin (NEURONTIN) 100 MG capsule Take 1 capsule (100 mg total) by mouth 3 (three) times daily. Earleen Newport, MD Needs Review  ibuprofen (ADVIL,MOTRIN) 600 MG tablet Take 1 tablet (600 mg total) by mouth every 8 (eight) hours as needed. Darel Hong, MD Needs Review  metroNIDAZOLE (FLAGYL) 500 MG tablet Take 1 tablet (500 mg total) by mouth 2 (two) times daily. Cuthriell, Charline Bills, PA-C Needs Review  ondansetron (ZOFRAN ODT) 4 MG disintegrating tablet Take 1 tablet (4 mg total) by mouth every 8 (eight) hours as needed for nausea or vomiting. Earleen Newport, MD Needs Review  pantoprazole (PROTONIX) 40 MG tablet Take 1 tablet (40 mg total) by mouth daily. Earleen Newport, MD Needs Review  penicillin v potassium (VEETID) 250 MG tablet Take 1 tablet (250 mg total) by mouth 4 (four) times daily. Lavonia Drafts, MD Needs Review  predniSONE (STERAPRED UNI-PAK 21 TAB) 10 MG (21) TBPK tablet  6-day taper as directed. Menshew, Charlesetta Ivory, PA-C Needs Review  QUEtiapine (SEROQUEL) 25 MG tablet Take 1 tablet (25 mg total) by mouth 2 (two) times daily. Ramonita Lab, MD Needs Review  traMADol (ULTRAM) 50 MG tablet Take 1 tablet (50 mg total) by mouth every 6 (six) hours as needed. Minna Antis, MD Needs Review     Allergies:   Allergies  Allergen Reactions  . Ambien [Zolpidem Tartrate] Other (See Comments)    death  . Ambien [Zolpidem Tartrate]   . Clindamycin/Lincomycin Nausea And Vomiting  . Keflex [Cephalexin] Nausea And Vomiting  . Ritalin [Methylphenidate Hcl] Other (See Comments)    death  . Ritalin [Methylphenidate Hcl]     ROS:  General: Denies weight loss, weight gain, fatigue, fevers, chills, and night sweats. Eyes: Denies blurry vision, double vision, eye pain, itchy eyes, and tearing. Ears: Denies hearing loss, earache, and ringing in ears. Nose: Denies sinus pain, congestion, infections, runny nose, and nosebleeds. Mouth/throat: Denies hoarseness, sore throat, bleeding gums, and difficulty swallowing. Heart: Denies chest pain, palpitations, racing heart, irregular heartbeat, leg pain or swelling, and decreased activity tolerance. Respiratory: Denies breathing difficulty, shortness of breath, wheezing, cough, and sputum. GI: Denies change in appetite, heartburn, nausea, vomiting, constipation, diarrhea, and blood in stool. GU: Denies difficulty urinating, pain with urinating, urgency, frequency, blood in urine. Musculoskeletal: Denies joint stiffness, pain, swelling, muscle weakness. Skin: Denies rash, itching, mass, tumors, sores, and boils Neurologic: Denies headache, fainting, dizziness, seizures, numbness, and tingling. Psychiatric: Denies depression, anxiety, difficulty sleeping, and memory loss. Endocrine: Denies heat or cold intolerance, and increased thirst or urination. Blood/lymph: Denies easy bruising, easy bruising, and swollen glands     Objective:     BP (!) 105/58   Pulse 82   Temp 98.4 F (36.9 C) (Oral)   Resp 20   Wt 105.2 kg   LMP 04/23/2019 Comment: tubal ligation  SpO2 98%   BMI 39.82 kg/m   Constitutional :  alert, cooperative, appears stated age and no distress  Lymphatics/Throat:  no asymmetry, masses, or scars  Respiratory:  clear to auscultation  bilaterally  Cardiovascular:  regular rate and rhythm  Gastrointestinal: soft, non-tender; bowel sounds normal; no masses,  no organomegaly.  Musculoskeletal: Steady gait and movement  Skin: Cool and moist.  Chaperone present for exam.  Right gluteal region notable for increased induration and erythema with extreme tenderness to palpation consistent with an abscess.  No obvious drainage noted at the time of exam.  Psychiatric: Normal affect, non-agitated, not confused       LABS:  CMP Latest Ref Rng & Units 04/23/2019 06/01/2018 06/23/2017  Glucose 70 - 99 mg/dL 94 536(R) 72  BUN 6 - 20 mg/dL 10 7 16   Creatinine 0.44 - 1.00 mg/dL 4.43 1.54)  Sodium 135 - 145 mmol/L 139 138 140  Potassium 3.5 - 5.1 mmol/L 3.9 3.5 3.7  Chloride 98 - 111 mmol/L 108 109 109  CO2 22 - 32 mmol/L 24 22 24   Calcium 8.9 - 10.3 mg/dL 0.08(Q) ) 9.5  Total Protein 6.5 - 8.1 g/dL - 6.1(L) 7.9  Total Bilirubin 0.3 - 1.2 mg/dL - 0.6 0.5  Alkaline Phos 38 - 126 U/L - 55 83  AST 15 - 41 U/L - 16 18  ALT 0 - 44 U/L - 10 12(L)   CBC Latest Ref Rng & Units 04/23/2019 06/01/2018 06/23/2017  WBC 4.0 - 10.5 K/uL 12.8(H) 15.3(H) 10.2  Hemoglobin 12.0 - 15.0 g/dL 10.4(L) 13.3 14.4  Hematocrit 36.0 - 46.0 % 31.8(L) 39.0 40.9  Platelets 150 - 400 K/uL 406(H) 268 304    RADS: CLINICAL DATA:  Soft tissue infection suspected, pelvis, no prior imaging cellulitis - right buttock  Patient reports 2 days of increasing right buttock pain. Remote history of right buttock abscess.  EXAM: CT PELVIS WITH CONTRAST  TECHNIQUE: Multidetector CT imaging of the pelvis was performed using the standard protocol following the bolus administration of intravenous contrast. Patient imaged prone.  CONTRAST:  OMNIPAQUE IOHEXOL 300 MG/ML  SOLN  COMPARISON:  Pelvis CT 10/01/2013  FINDINGS: Urinary Tract: Distal ureters are decompressed. Urinary bladder is nondistended. No bladder wall thickening.  Bowel: Right  perirectal fluid collection as described below. Mild anal rectal wall thickening. No other small or large bowel wall thickening involving pelvic bowel loops. Appendix is normal or visualized.  Vascular/Lymphatic: For prominent bilateral inguinal nodes are likely reactive. The iliac vessels are normal in caliber.  Reproductive: Uterus and both ovaries are unremarkable. No adnexal mass.  Other: Right perirectal fluid collection is partially septated, similar in location to prior exam. Fluid collection extends inferiorly along the right gluteal crease. This measures approximately 5.8 x 2.9 x 8.1 cm. No internal or soft tissue air. There is surrounding soft tissue stranding and inflammatory change. Skin thickening and subcutaneous inflammation extends into the soft tissues of the posterior gluteal regions. No significant peroneal extension. No additional intrapelvic fluid collection. Trace pelvic free fluid which is likely reactive/physiologic.  Musculoskeletal: There are no acute or suspicious osseous abnormalities. No intramuscular fluid collection.  IMPRESSION: Right perirectal abscess measuring 5.8 x 2.9 x 8.1 cm. Associated cellulitis extending inferiorly in the right gluteal crease.   Electronically Signed   By: Narda Rutherford M.D.   On: 04/23/2019 19:28  Assessment:      Right gluteal abscess with associated cellulitis  Plan:     1.  Discussed incision and drainage.  Alternatives include continued observation and outpatient antibiotics, although this will likely feel due to be confirmed presence of an abscess on CT scan.  Benefits include possible symptom relief, pathologic evaluation.  Discussed the risk of surgery including recurrence, chronic pain, post-op infxn, poor cosmesis, poor/delayed wound healing, and possible re-operation to address said risks. The risks of general anesthetic, if used, includes MI, CVA, sudden death or even reaction to anesthetic  medications also discussed.   Typical post-op recovery time of 3-5 days with need for chronic wound care just like her previous episode was also discussed.  The patient verbalized understanding and all questions were answered to the patient's satisfaction.  Patient reported that she has extreme anxiety undergoing surgical procedure at this time and she would prefer to be able to talk to family members and leave the hospital on outpatient antibiotics before committing to any sort of surgical procedure.  I did extensively discuss with her the likelihood that outpatient antibiotics will be unsuccessful and that pain control may also be an issue as well with oral narcotics alone.    After extensive discussion, patient still wishes to proceed to hold off on any surgery and potentially come back as an outpatient to schedule.  I informed her that we will leave my office contact information with her discharge paperwork and asked the ED providers to provide antibiotics as well as oral pain medications until she is ready for a surgical procedure.

## 2019-04-23 NOTE — ED Provider Notes (Addendum)
Patient evaluated, complains of severe pain in the right buttocks.  Was given oxycodone earlier which helped but is wearing off.  Patient states pain is too severe right now to let me examine area of pain and swelling.  Patient ordered 1 mg of Dilaudid IV.  She states she also is very cold and would like blankets.  Warm blankets provided for patient.  Dr. Tonna Boehringer notified of patient and CT results.  Dr. Tonna Boehringer evaluated patient had discussion with patient's in regards to her exam and CT, lab findings.  He elected to return home.  She is very hesitant to proceed with any surgical intervention.  She would like to try a trial of antibiotics, will start patient on bactrim. She will follow-up with Dr. Geoffery Lyons office first of next week.  At time of discharge pain improved, vital signs stable.  She understands signs and symptoms to return to ED for.   Evon Slack, PA-C 04/23/19 2032    Evon Slack, PA-C 04/23/19 2033    Minna Antis, MD 04/23/19 2051

## 2019-04-23 NOTE — ED Triage Notes (Signed)
Pt presents via POV c/o buttocks pain. Reports surgery x2 years ago on same area due to infection. Pt reports pain  For a couple of days. Attempted to visualize area but pt c/o severe pain to touch. No obvious drainage noted.

## 2019-04-23 NOTE — Discharge Instructions (Addendum)
Please take antibiotics as prescribed.  You may use oxycodone as needed for moderate to severe pain.  Take Tylenol for mild pain.  Perform sitz bath to help soothe pain and swelling.  Make sure you are drinking lots of fluids and taking softener such as MiraLAX daily to prevent constipation.  If any increasing pain, swelling, fevers, return to the emergency department immediately.

## 2019-04-23 NOTE — ED Provider Notes (Signed)
[facility] Emergency Department Provider Note ____________________________________________  Time seen: 1535  I have reviewed the triage vital signs and the nursing notes.  HISTORY  Chief Complaint  Rectal Pain  HPI Brianna Ashley is a 34 y.o. female presents himself to the ED for evaluation of a  2-day complaint of increasing right buttocks pain.  Patient denies any recent injury, trauma, fall patient has a remote history of a abscess to the right buttocks that was treated surgically 5 years ago.  Patient describes having to be admitted for the procedure and discharged with wound care instructions.  She denies any interim complaints related to skin infections, cellulitis, or abscess formation.  She presents today noting buttocks pain and difficulty touching and/or sitting on the right buttocks.  She denies any interim fevers, chills, or sweats.  Patient has a history of factor V Leiden and DVT. She admits to being non-complaint with her apixiban therapy.   Past Medical History:  Diagnosis Date  . Asthma   . Carpal tunnel syndrome   . DVT of lower extremity (deep venous thrombosis) ([facility])   . Factor 5 Leiden mutation, heterozygous ([facility])   . Migraine   . Seizures [facility])     Patient Active Problem List   Diagnosis Date Noted  . Factor V Leiden ([facility]) 03/28/2016  . Acute deep vein thrombosis (DVT) of femoral vein of left lower extremity ([facility]) 03/28/2016  . Lupus anticoagulant positive 03/28/2016  . DVT (deep venous thrombosis) ([facility]) 02/02/2016    Past Surgical History:  Procedure Laterality Date  . ABSCESS DRAINAGE    . CESAREAN SECTION    . DILATION AND CURETTAGE OF UTERUS      Prior to Admission medications   Medication Sig Start Date End Date Taking? Authorizing Provider  acetaminophen-codeine (TYLENOL #3) 300-30 MG tablet Take 1 tablet every 6 (six) hours as needed by mouth for moderate pain. 02/03/17   Quanah Majka, Charlesetta Ivory, PA-C  albuterol  (PROVENTIL HFA;VENTOLIN HFA) 108 (90 Base) MCG/ACT inhaler Inhale 2 puffs every 6 (six) hours as needed into the lungs for wheezing or shortness of breath. 02/03/17   Miracle Criado, Charlesetta Ivory, PA-C  albuterol (PROVENTIL HFA;VENTOLIN HFA) 108 (90 Base) MCG/ACT inhaler Inhale 2 puffs into the lungs every 6 (six) hours as needed for wheezing or shortness of breath. 06/23/17   Emily Filbert, MD  apixaban (ELIQUIS) 5 MG TABS tablet Take 1 tablet (5 mg total) by mouth 2 (two) times daily. 06/21/18   Minna Antis, MD  benzonatate (TESSALON PERLES) 100 MG capsule Take 1-2 tabs TID prn cough 02/03/17   Ellizabeth Dacruz, Charlesetta Ivory, PA-C  citalopram (CELEXA) 40 MG tablet Take 1 tablet (40 mg total) by mouth daily. 02/04/16   Gouru, Deanna Artis, MD  clonazePAM (KLONOPIN) 1 MG tablet Take 1 tablet (1 mg total) by mouth 3 (three) times daily. 02/03/16   Ramonita Lab, MD  docusate sodium (COLACE) 100 MG capsule Take 1 capsule (100 mg total) by mouth 2 (two) times daily as needed for mild constipation. 02/03/16   Gouru, Deanna Artis, MD  fluticasone (FLONASE) 50 MCG/ACT nasal spray Place 2 sprays daily into both nostrils. 02/03/17   Cinda Hara, Charlesetta Ivory, PA-C  gabapentin (NEURONTIN) 100 MG capsule Take 1 capsule (100 mg total) by mouth 3 (three) times daily. 06/23/17 06/23/18  Emily Filbert, MD  ibuprofen (ADVIL,MOTRIN) 600 MG tablet Take 1 tablet (600 mg total) by mouth every 8 (eight) hours as needed. 01/19/18   Rifenbark,  Lloyd Huger, MD  metroNIDAZOLE (FLAGYL) 500 MG tablet Take 1 tablet (500 mg total) by mouth 2 (two) times daily. 10/14/16   Cuthriell, Delorise Royals, PA-C  ondansetron (ZOFRAN ODT) 4 MG disintegrating tablet Take 1 tablet (4 mg total) by mouth every 8 (eight) hours as needed for nausea or vomiting. 06/23/17   Emily Filbert, MD  pantoprazole (PROTONIX) 40 MG tablet Take 1 tablet (40 mg total) by mouth daily. 06/23/17 06/23/18  Emily Filbert, MD  penicillin v potassium (VEETID) 250 MG tablet Take 1 tablet  (250 mg total) by mouth 4 (four) times daily. 03/02/18   Jene Every, MD  predniSONE (STERAPRED UNI-PAK 21 TAB) 10 MG (21) TBPK tablet 6-day taper as directed. 02/03/17   Lattie Cervi, Charlesetta Ivory, PA-C  QUEtiapine (SEROQUEL) 25 MG tablet Take 1 tablet (25 mg total) by mouth 2 (two) times daily. 02/03/16   Gouru, Deanna Artis, MD  traMADol (ULTRAM) 50 MG tablet Take 1 tablet (50 mg total) by mouth every 6 (six) hours as needed. 06/21/18 06/21/19  Minna Antis, MD    Allergies Ambien [zolpidem tartrate], Ambien [zolpidem tartrate], Clindamycin/lincomycin, Keflex [cephalexin], Ritalin [methylphenidate hcl], and Ritalin [methylphenidate hcl]  Family History  Problem Relation Age of Onset  . Factor V Leiden deficiency Mother        deceased of drug overdose    Social History Social History   Tobacco Use  . Smoking status: Current Every Day Smoker    Packs/day: 1.00    Years: 20.00    Pack years: 20.00    Types: Cigarettes  . Smokeless tobacco: Never Used  Substance Use Topics  . Alcohol use: No  . Drug use: No    Comment: last use New Year's eve.    Review of Systems  Constitutional: Negative for fever. Cardiovascular: Negative for chest pain. Respiratory: Negative for shortness of breath. Gastrointestinal: Negative for abdominal pain, vomiting and diarrhea. Genitourinary: Negative for dysuria. Musculoskeletal: Negative for back pain. Skin: Negative for rash. Right buttock abscess as above.  Neurological: Negative for headaches, focal weakness or numbness. ____________________________________________  PHYSICAL EXAM:  VITAL SIGNS: ED Triage Vitals  Enc Vitals Group     BP 04/23/19 1327 (!) 105/58     Pulse Rate 04/23/19 1327 82     Resp 04/23/19 1327 20     Temp 04/23/19 1327 98.4 F (36.9 C)     Temp Source 04/23/19 1327 Oral     SpO2 04/23/19 1327 98 %     Weight 04/23/19 1356 232 lb (105.2 kg)     Height --      Head Circumference --      Peak Flow --      Pain  Score 04/23/19 1356 10     Pain Loc --      Pain Edu? --      Excl. in GC? --     Constitutional: Alert and oriented. Well appearing and in no distress. Head: Normocephalic and atraumatic. Eyes: Conjunctivae are normal. Normal extraocular movements Cardiovascular: Normal rate, regular rhythm. Normal distal pulses. Respiratory: Normal respiratory effort. No wheezes/rales/rhonchi. Gastrointestinal: Soft and nontender. No distention. Musculoskeletal: Nontender with normal range of motion in all extremities.  Neurologic:  Normal gait without ataxia. Normal speech and language. No gross focal neurologic deficits are appreciated. Skin:  Skin is warm, dry and intact. No rash noted. Right buttock with local hypertrophic skin changes consistent with prior surgery. The area is erythematous and indurated. An inferior portion is slightly fluctuant. No  spontaneous drainage or pointing is appreciated.  Psychiatric: Mood and affect are normal. Patient exhibits appropriate insight and judgment. ____________________________________________   LABS (pertinent positives/negatives) Labs Reviewed  BASIC METABOLIC PANEL - Abnormal; Notable for the following components:      Result Value   Calcium 8.6 (*)    All other components within normal limits  CBC WITH DIFFERENTIAL/PLATELET - Abnormal; Notable for the following components:   WBC 12.8 (*)    RBC 3.76 (*)    Hemoglobin 10.4 (*)    HCT 31.8 (*)    Platelets 406 (*)    Neutro Abs 8.0 (*)    All other components within normal limits  URINALYSIS, COMPLETE (UACMP) WITH MICROSCOPIC - Abnormal; Notable for the following components:   Color, Urine STRAW (*)    APPearance CLEAR (*)    Specific Gravity, Urine 1.003 (*)    Bacteria, UA RARE (*)    All other components within normal limits  SARS CORONAVIRUS 2 (TAT 6-24 HRS)  PROTIME-INR  PREGNANCY, URINE  LACTIC ACID, PLASMA  ____________________________________________   RADIOLOGY  CT Pelvis w/  CM Pending at  ____________________________________________  PROCEDURES  Percocet 5-325 mg PO Zofran 4 mg ODT Procedures ____________________________________________  INITIAL IMPRESSION / ASSESSMENT AND PLAN / ED COURSE  Patient to the ED for evaluation of right buttock pain and induration, concerning for acute cellulitis and abscess. The patient was admitted and treated surgically in 2015 for a right buttock abscess with necrosis. She has been without related complaint until today.  ----------------------------------------- 6:13 PM on 04/23/2019 ----------------------------------------- Patient notified of labs showing WBCs w/ shift. She is asking to eat/drink. I have pleaded for her to remain NPO until the CT results are available and a plan of care is established.   ----------------------------------------- 7:07 PM on 04/23/2019 ----------------------------------------- Patient transported to CT.  Care is transferred to C. Arvella Nigh, PA-C for final disposition.    Brianna Ashley was evaluated in Emergency Department on 04/23/2019 for the symptoms described in the history of present illness. She was evaluated in the context of the global COVID-19 pandemic, which necessitated consideration that the patient might be at risk for infection with the SARS-CoV-2 virus that causes COVID-19. Institutional protocols and algorithms that pertain to the evaluation of patients at risk for COVID-19 are in a state of rapid change based on information released by regulatory bodies including the CDC and federal and state organizations. These policies and algorithms were followed during the patient's care in the ED. ____________________________________________  FINAL CLINICAL IMPRESSION(S) / ED DIAGNOSES  Final diagnoses:  Abscess of right buttock      Carmie End, Dannielle Karvonen, PA-C 04/23/19 1909    Harvest Dark, MD 04/23/19 2050

## 2019-04-23 NOTE — ED Notes (Signed)
Attempted IV w/ out success. Pt states she has a history of being hard stick. IV team consult placed.

## 2019-07-24 ENCOUNTER — Emergency Department
Admission: EM | Admit: 2019-07-24 | Discharge: 2019-07-24 | Disposition: A | Discharge status: Home / Self Care | Payer: Self-pay | Attending: Emergency Medicine | Admitting: Emergency Medicine

## 2019-07-24 ENCOUNTER — Emergency Department: Payer: Self-pay

## 2019-07-24 ENCOUNTER — Encounter: Payer: Self-pay | Admitting: Emergency Medicine

## 2019-07-24 ENCOUNTER — Other Ambulatory Visit: Payer: Self-pay

## 2019-07-24 DIAGNOSIS — Z86718 Personal history of other venous thrombosis and embolism: Secondary | ICD-10-CM | POA: Insufficient documentation

## 2019-07-24 DIAGNOSIS — M79605 Pain in left leg: Secondary | ICD-10-CM | POA: Insufficient documentation

## 2019-07-24 DIAGNOSIS — D6851 Activated protein C resistance: Secondary | ICD-10-CM | POA: Insufficient documentation

## 2019-07-24 DIAGNOSIS — F17210 Nicotine dependence, cigarettes, uncomplicated: Secondary | ICD-10-CM | POA: Insufficient documentation

## 2019-07-24 DIAGNOSIS — K0889 Other specified disorders of teeth and supporting structures: Secondary | ICD-10-CM | POA: Insufficient documentation

## 2019-07-24 DIAGNOSIS — J45909 Unspecified asthma, uncomplicated: Secondary | ICD-10-CM | POA: Insufficient documentation

## 2019-07-24 DIAGNOSIS — M3210 Systemic lupus erythematosus, organ or system involvement unspecified: Secondary | ICD-10-CM | POA: Insufficient documentation

## 2019-07-24 MED ORDER — HYDROCODONE-ACETAMINOPHEN 5-325 MG PO TABS: 1.0000 | ORAL_TABLET | Freq: Four times a day (QID) | ORAL | 0 refills | Status: AC | PRN

## 2019-07-24 MED ORDER — AMOXICILLIN 875 MG PO TABS: 875.0000 mg | ORAL_TABLET | Freq: Two times a day (BID) | ORAL | 0 refills | Status: AC

## 2019-07-24 MED ORDER — HYDROCODONE-ACETAMINOPHEN 5-325 MG PO TABS
1.0000 | ORAL_TABLET | Freq: Once | ORAL | Status: AC
  Administered 2019-07-24: 1 via ORAL
  Filled 2019-07-24: qty 1

## 2019-07-24 MED ORDER — AMOXICILLIN 500 MG PO CAPS
500.0000 mg | ORAL_CAPSULE | Freq: Once | ORAL | Status: AC
  Administered 2019-07-24: 500 mg via ORAL
  Filled 2019-07-24: qty 1

## 2019-07-24 MED ORDER — APIXABAN 5 MG PO TABS: 5.0000 mg | ORAL_TABLET | Freq: Two times a day (BID) | ORAL | 0 refills | Status: DC

## 2019-07-24 NOTE — Discharge Instructions (Signed)
Take Eliquis twice daily. Take amoxicillin twice daily for 10 days. You can take Norco and Tylenol for pain.

## 2019-07-24 NOTE — ED Provider Notes (Signed)
Emergency Department Provider Note  ____________________________________________  Time seen: Approximately 4:49 PM  I have reviewed the triage vital signs and the nursing notes.   HISTORY  Chief Complaint Leg Pain, Otalgia, and Dental Pain   Historian Patient     HPI Brianna Ashley is a 34 y.o. female with a history of factor V Leiden and prior DVT, presents to the emergency department with diffuse left lower leg pain.  Patient denies falls or mechanisms of trauma.  Patient states that she has had a prior DVT of the left lower extremity and reports that symptoms feel similar.  Patient states that she is supposed to be chronically anticoagulated with Eliquis but states that she has not picked up her medication as she is currently homeless.  She states that she now has financial means to pick up her prescriptions.  She is secondarily complaining of dental pain.  No chest pain, chest tightness or shortness of breath.  Patient is requesting pain medication and a prescription for Eliquis.   Past Medical History:  Diagnosis Date  . Asthma   . Carpal tunnel syndrome   . DVT of lower extremity (deep venous thrombosis) (HCC)   . Factor 5 Leiden mutation, heterozygous (HCC)   . Migraine   . Seizures (HCC)      Immunizations up to date:  Yes.     Past Medical History:  Diagnosis Date  . Asthma   . Carpal tunnel syndrome   . DVT of lower extremity (deep venous thrombosis) (HCC)   . Factor 5 Leiden mutation, heterozygous (HCC)   . Migraine   . Seizures [facility])     Patient Active Problem List   Diagnosis Date Noted  . Factor V Leiden (HCC) 03/28/2016  . Acute deep vein thrombosis (DVT) of femoral vein of left lower extremity (HCC) 03/28/2016  . Lupus anticoagulant positive 03/28/2016  . DVT (deep venous thrombosis) (HCC) 02/02/2016    Past Surgical History:  Procedure Laterality Date  . ABSCESS DRAINAGE    . CESAREAN SECTION    . DILATION AND CURETTAGE OF UTERUS       Prior to Admission medications   Medication Sig Start Date End Date Taking? Authorizing Provider  acetaminophen-codeine (TYLENOL #3) 300-30 MG tablet Take 1 tablet every 6 (six) hours as needed by mouth for moderate pain. 02/03/17   Menshew, Charlesetta Ivory, PA-C  albuterol (PROVENTIL HFA;VENTOLIN HFA) 108 (90 Base) MCG/ACT inhaler Inhale 2 puffs every 6 (six) hours as needed into the lungs for wheezing or shortness of breath. 02/03/17   Menshew, Charlesetta Ivory, PA-C  albuterol (PROVENTIL HFA;VENTOLIN HFA) 108 (90 Base) MCG/ACT inhaler Inhale 2 puffs into the lungs every 6 (six) hours as needed for wheezing or shortness of breath. 06/23/17   Emily Filbert, MD  amoxicillin (AMOXIL) 875 MG tablet Take 1 tablet (875 mg total) by mouth 2 (two) times daily for 10 days. 07/24/19 08/03/19  Orvil Feil, PA-C  apixaban (ELIQUIS) 5 MG TABS tablet Take 1 tablet (5 mg total) by mouth 2 (two) times daily. 07/24/19 08/23/19  Orvil Feil, PA-C  benzonatate (TESSALON PERLES) 100 MG capsule Take 1-2 tabs TID prn cough 02/03/17   Menshew, Charlesetta Ivory, PA-C  citalopram (CELEXA) 40 MG tablet Take 1 tablet (40 mg total) by mouth daily. 02/04/16   Gouru, Deanna Artis, MD  clonazePAM (KLONOPIN) 1 MG tablet Take 1 tablet (1 mg total) by mouth 3 (three) times daily. 02/03/16   Ramonita Lab, MD  docusate sodium (COLACE) 100 MG capsule Take 1 capsule (100 mg total) by mouth 2 (two) times daily as needed for mild constipation. 02/03/16   Gouru, Deanna Artis, MD  fluticasone (FLONASE) 50 MCG/ACT nasal spray Place 2 sprays daily into both nostrils. 02/03/17   Menshew, Charlesetta Ivory, PA-C  gabapentin (NEURONTIN) 100 MG capsule Take 1 capsule (100 mg total) by mouth 3 (three) times daily. 06/23/17 06/23/18  Emily Filbert, MD  HYDROcodone-acetaminophen (NORCO) 5-325 MG tablet Take 1 tablet by mouth every 6 (six) hours as needed for up to 3 days for moderate pain. 07/24/19 07/27/19  Orvil Feil, PA-C  ibuprofen (ADVIL,MOTRIN) 600  MG tablet Take 1 tablet (600 mg total) by mouth every 8 (eight) hours as needed. 01/19/18   Merrily Brittle, MD  metroNIDAZOLE (FLAGYL) 500 MG tablet Take 1 tablet (500 mg total) by mouth 2 (two) times daily. 10/14/16   Cuthriell, Delorise Royals, PA-C  ondansetron (ZOFRAN ODT) 4 MG disintegrating tablet Take 1 tablet (4 mg total) by mouth every 8 (eight) hours as needed for nausea or vomiting. 06/23/17   Emily Filbert, MD  oxyCODONE (ROXICODONE) 5 MG immediate release tablet Take 1 tablet (5 mg total) by mouth every 8 (eight) hours as needed. 04/23/19 04/22/20  Evon Slack, PA-C  pantoprazole (PROTONIX) 40 MG tablet Take 1 tablet (40 mg total) by mouth daily. 06/23/17 06/23/18  Emily Filbert, MD  penicillin v potassium (VEETID) 250 MG tablet Take 1 tablet (250 mg total) by mouth 4 (four) times daily. 03/02/18   Jene Every, MD  predniSONE (STERAPRED UNI-PAK 21 TAB) 10 MG (21) TBPK tablet 6-day taper as directed. 02/03/17   Menshew, Charlesetta Ivory, PA-C  QUEtiapine (SEROQUEL) 25 MG tablet Take 1 tablet (25 mg total) by mouth 2 (two) times daily. 02/03/16   Ramonita Lab, MD    Allergies Ambien [zolpidem tartrate], Ambien [zolpidem tartrate], Clindamycin/lincomycin, Keflex [cephalexin], Methylphenidate, Ritalin [methylphenidate hcl], Ritalin [methylphenidate hcl], and Zolpidem  Family History  Problem Relation Age of Onset  . Factor V Leiden deficiency Mother        deceased of drug overdose    Social History Social History   Tobacco Use  . Smoking status: Current Every Day Smoker    Packs/day: 1.00    Years: 20.00    Pack years: 20.00    Types: Cigarettes  . Smokeless tobacco: Never Used  Substance Use Topics  . Alcohol use: No  . Drug use: No    Comment: last use New Year's eve.     Review of Systems  Constitutional: No fever/chills Eyes:  No discharge ENT: Patient has dental pain.  Respiratory: no cough. No SOB/ use of accessory muscles to breath Gastrointestinal:    No nausea, no vomiting.  No diarrhea.  No constipation. Musculoskeletal: Patient has leg pain.  Skin: Negative for rash, abrasions, lacerations, ecchymosis.  ____________________________________________   PHYSICAL EXAM:  VITAL SIGNS: ED Triage Vitals  Enc Vitals Group     BP 07/24/19 1453 (!) 118/57     Pulse Rate 07/24/19 1453 73     Resp 07/24/19 1453 18     Temp 07/24/19 1453 98 F (36.7 C)     Temp Source 07/24/19 1453 Oral     SpO2 07/24/19 1453 98 %     Weight 07/24/19 1458 230 lb (104.3 kg)     Height 07/24/19 1458 5\' 3"  (1.6 m)     Head Circumference --      Peak  Flow --      Pain Score 07/24/19 1453 10     Pain Loc --      Pain Edu? --      Excl. in GC? --      Constitutional: Alert and oriented. Well appearing and in no acute distress. Eyes: Conjunctivae are normal. PERRL. EOMI. Head: Atraumatic. ENT:      Ears: TMs are pearly.       Nose: No congestion/rhinnorhea.      Mouth/Throat: Mucous membranes are moist.  No significant swelling of the left lower jaw.  No pain to palpation underneath the tongue. Neck: No stridor.  No cervical spine tenderness to palpation. Cardiovascular: Normal rate, regular rhythm. Normal S1 and S2.  Good peripheral circulation. Respiratory: Normal respiratory effort without tachypnea or retractions. Lungs CTAB. Good air entry to the bases with no decreased or absent breath sounds Gastrointestinal: Bowel sounds x 4 quadrants. Soft and nontender to palpation. No guarding or rigidity. No distention. Musculoskeletal: Full range of motion to all extremities. No obvious deformities noted Neurologic:  Normal for age. No gross focal neurologic deficits are appreciated.  Skin: Lower extremities appear symmetrical.  No pitting edema or erythema of the bilateral lower extremities. Psychiatric: Mood and affect are normal for age. Speech and behavior are normal.   ____________________________________________   LABS (all labs ordered are listed,  but only abnormal results are displayed)  Labs Reviewed - No data to display ____________________________________________  EKG   ____________________________________________  RADIOLOGY Geraldo Pitter, personally viewed and evaluated these images (plain radiographs) as part of my medical decision making, as well as reviewing the written report by the radiologist.  US Venous Img Lower Unilateral Left  Result Date: 07/24/2019 CLINICAL DATA:  Left lower extremity pain and swelling. EXAM: LEFT LOWER EXTREMITY VENOUS DOPPLER ULTRASOUND TECHNIQUE: Gray-scale sonography with compression, as well as color and duplex ultrasound, were performed to evaluate the deep venous system(s) from the level of the common femoral vein through the popliteal and proximal calf veins. COMPARISON:  None. FINDINGS: VENOUS Normal compressibility of the common femoral, superficial femoral, and popliteal veins, as well as the visualized calf veins. Visualized portions of profunda femoral vein and great saphenous vein unremarkable. No filling defects to suggest DVT on grayscale or color Doppler imaging. Doppler waveforms show normal direction of venous flow, normal respiratory plasticity and response to augmentation. Limited views of the contralateral common femoral vein are unremarkable. OTHER None. Limitations: none IMPRESSION: Negative. Electronically Signed   By: Elberta Fortis M.D.   On: 07/24/2019 15:37    ____________________________________________    PROCEDURES  Procedure(s) performed:     Procedures     Medications  HYDROcodone-acetaminophen (NORCO/VICODIN) 5-325 MG per tablet 1 tablet (has no administration in time range)  amoxicillin (AMOXIL) capsule 500 mg (has no administration in time range)     ____________________________________________   INITIAL IMPRESSION / ASSESSMENT AND PLAN / ED COURSE  Pertinent labs & imaging results that were available during my care of the patient were reviewed  by me and considered in my medical decision making (see chart for details).      Assessment and plan Leg pain Dental pain 34 year old female presents to the emergency department with left lower leg pain and dental pain.  Vital signs are reassuring at triage.  On physical exam, lower extremities appeared symmetrical without overlying erythema.  Venous ultrasound was noncontributory for thrombus formation.  Patient became very upset when I reviewed the ultrasound results for her  as she felt confident that she has a least to start to a DVT as she is experiencing symptoms multiple times in the past.  She explained to me that she is supposed to be chronically anticoagulated with Eliquis.  I agreed to start patient on Eliquis twice daily for the next month.  Patient has a follow-up appointment with her primary care provider on 10 May.  Patient was started on amoxicillin for dental pain and she was prescribed a short course of Norco for her leg pain.  She was given strict return precautions to return with new or worsening symptoms.  All patient questions were answered.    ____________________________________________  FINAL CLINICAL IMPRESSION(S) / ED DIAGNOSES  Final diagnoses:  Pain of left lower extremity  Pain, dental      NEW MEDICATIONS STARTED DURING THIS VISIT:  ED Discharge Orders         Ordered    HYDROcodone-acetaminophen (NORCO) 5-325 MG tablet  Every 6 hours PRN     07/24/19 1645    amoxicillin (AMOXIL) 875 MG tablet  2 times daily     07/24/19 1645    apixaban (ELIQUIS) 5 MG TABS tablet  2 times daily     07/24/19 1646              This chart was dictated using voice recognition software/Dragon. Despite best efforts to proofread, errors can occur which can change the meaning. Any change was purely unintentional.     Lannie Fields, PA-C 07/24/19 1654    Carrie Mew, MD 07/25/19 [phone]

## 2019-07-24 NOTE — ED Triage Notes (Addendum)
Pt arrived via POV with reports of L leg pain and swelling, pt states the pain started a couple of days ago.  Pt reports prior hx of blood clots, states she is supposed to be on blood thinners but has not taken any in several months.   Pt also c/o dental pain and left ear pain.

## 2019-10-07 ENCOUNTER — Other Ambulatory Visit: Payer: Self-pay

## 2019-10-07 ENCOUNTER — Emergency Department: Payer: Self-pay

## 2019-10-07 ENCOUNTER — Emergency Department
Admission: EM | Admit: 2019-10-07 | Discharge: 2019-10-07 | Disposition: A | Discharge status: Home / Self Care | Payer: Self-pay | Attending: Emergency Medicine | Admitting: Emergency Medicine

## 2019-10-07 DIAGNOSIS — F17210 Nicotine dependence, cigarettes, uncomplicated: Secondary | ICD-10-CM | POA: Insufficient documentation

## 2019-10-07 DIAGNOSIS — J45909 Unspecified asthma, uncomplicated: Secondary | ICD-10-CM | POA: Insufficient documentation

## 2019-10-07 DIAGNOSIS — Z7951 Long term (current) use of inhaled steroids: Secondary | ICD-10-CM | POA: Insufficient documentation

## 2019-10-07 DIAGNOSIS — Z7901 Long term (current) use of anticoagulants: Secondary | ICD-10-CM | POA: Insufficient documentation

## 2019-10-07 DIAGNOSIS — I8001 Phlebitis and thrombophlebitis of superficial vessels of right lower extremity: Secondary | ICD-10-CM | POA: Insufficient documentation

## 2019-10-07 LAB — BASIC METABOLIC PANEL
Anion gap: 4 — ABNORMAL LOW (ref 5–15)
BUN: 11 mg/dL (ref 6–20)
CO2: 24 mmol/L (ref 22–32)
Calcium: 8.5 mg/dL — ABNORMAL LOW (ref 8.9–10.3)
Chloride: 108 mmol/L (ref 98–111)
Creatinine, Ser: 0.91 mg/dL (ref 0.44–1.00)
GFR calc Af Amer: >60 mL/min (ref >60)
GFR calc non Af Amer: >60 mL/min (ref >60)
Glucose, Bld: 111 mg/dL — ABNORMAL HIGH (ref 70–99)
Potassium: 4.1 mmol/L (ref 3.5–5.1)
Sodium: 136 mmol/L (ref 135–145)

## 2019-10-07 LAB — CBC
HCT: 36.8 % (ref 36.0–46.0)
Hemoglobin: 12.2 g/dL (ref 12.0–15.0)
MCH: 29.2 pg (ref 26.0–34.0)
MCHC: 33.2 g/dL (ref 30.0–36.0)
MCV: 88 fL (ref 80.0–100.0)
Platelets: 290 10*3/uL (ref 150–400)
RBC: 4.18 MIL/uL (ref 3.87–5.11)
RDW: 14.6 % (ref 11.5–15.5)
WBC: 10.5 10*3/uL (ref 4.0–10.5)
nRBC: 0 % (ref 0.0–0.2)

## 2019-10-07 MED ORDER — OXYCODONE-ACETAMINOPHEN 5-325 MG PO TABS: 1.0000 | ORAL_TABLET | Freq: Four times a day (QID) | ORAL | 0 refills | Status: DC | PRN

## 2019-10-07 MED ORDER — OXYCODONE-ACETAMINOPHEN 5-325 MG PO TABS
1.0000 | ORAL_TABLET | Freq: Once | ORAL | Status: AC
  Administered 2019-10-07: 1 via ORAL
  Filled 2019-10-07: qty 1

## 2019-10-07 NOTE — ED Provider Notes (Signed)
[facility] Emergency Department Provider Note  ____________________________________________  Time seen: Approximately 3:31 PM  I have reviewed the triage vital signs and the nursing notes.   HISTORY  Chief Complaint Leg Pain    HPI Brianna Ashley is a 34 y.o. female with a history of factor V Leiden mutation, DVT, migraine, seizures who comes the ED complaining of right lower leg pain and swelling, gradual onset over the past few days, worsening, constant. Worse with walking, no alleviating factors. No fevers chills chest pain or shortness of breath.  She has history of DVT and PE. She is on Eliquis 5 mg twice daily which her primary care doctor is managing for her.  She does note that she sometimes misses doses. She works in [employer], on her feet for hours at a time.     Past Medical History:  Diagnosis Date  . Asthma   . Carpal tunnel syndrome   . DVT of lower extremity (deep venous thrombosis) (HCC)   . Factor 5 Leiden mutation, heterozygous (HCC)   . Migraine   . Seizures [facility])      Patient Active Problem List   Diagnosis Date Noted  . Factor V Leiden (HCC) 03/28/2016  . Acute deep vein thrombosis (DVT) of femoral vein of left lower extremity (HCC) 03/28/2016  . Lupus anticoagulant positive 03/28/2016  . DVT (deep venous thrombosis) (HCC) 02/02/2016     Past Surgical History:  Procedure Laterality Date  . ABSCESS DRAINAGE    . CESAREAN SECTION    . DILATION AND CURETTAGE OF UTERUS       Prior to Admission medications   Medication Sig Start Date End Date Taking? Authorizing Provider  acetaminophen-codeine (TYLENOL #3) 300-30 MG tablet Take 1 tablet every 6 (six) hours as needed by mouth for moderate pain. 02/03/17   Menshew, Charlesetta Ivory, PA-C  albuterol (PROVENTIL HFA;VENTOLIN HFA) 108 (90 Base) MCG/ACT inhaler Inhale 2 puffs every 6 (six) hours as needed into the lungs for wheezing or shortness of breath. 02/03/17   Menshew,  Charlesetta Ivory, PA-C  albuterol (PROVENTIL HFA;VENTOLIN HFA) 108 (90 Base) MCG/ACT inhaler Inhale 2 puffs into the lungs every 6 (six) hours as needed for wheezing or shortness of breath. 06/23/17   Emily Filbert, MD  apixaban (ELIQUIS) 5 MG TABS tablet Take 1 tablet (5 mg total) by mouth 2 (two) times daily. 07/24/19 08/23/19  Orvil Feil, PA-C  benzonatate (TESSALON PERLES) 100 MG capsule Take 1-2 tabs TID prn cough 02/03/17   Menshew, Charlesetta Ivory, PA-C  citalopram (CELEXA) 40 MG tablet Take 1 tablet (40 mg total) by mouth daily. 02/04/16   Gouru, Deanna Artis, MD  clonazePAM (KLONOPIN) 1 MG tablet Take 1 tablet (1 mg total) by mouth 3 (three) times daily. 02/03/16   Ramonita Lab, MD  docusate sodium (COLACE) 100 MG capsule Take 1 capsule (100 mg total) by mouth 2 (two) times daily as needed for mild constipation. 02/03/16   Gouru, Deanna Artis, MD  fluticasone (FLONASE) 50 MCG/ACT nasal spray Place 2 sprays daily into both nostrils. 02/03/17   Menshew, Charlesetta Ivory, PA-C  gabapentin (NEURONTIN) 100 MG capsule Take 1 capsule (100 mg total) by mouth 3 (three) times daily. 06/23/17 06/23/18  Emily Filbert, MD  ibuprofen (ADVIL,MOTRIN) 600 MG tablet Take 1 tablet (600 mg total) by mouth every 8 (eight) hours as needed. 01/19/18   Merrily Brittle, MD  metroNIDAZOLE (FLAGYL) 500 MG tablet Take 1 tablet (500 mg total)  by mouth 2 (two) times daily. 10/14/16   Cuthriell, Delorise RoyalsJonathan D, PA-C  ondansetron (ZOFRAN ODT) 4 MG disintegrating tablet Take 1 tablet (4 mg total) by mouth every 8 (eight) hours as needed for nausea or vomiting. 06/23/17   Emily FilbertWilliams, Jonathan E, MD  oxyCODONE (ROXICODONE) 5 MG immediate release tablet Take 1 tablet (5 mg total) by mouth every 8 (eight) hours as needed. 04/23/19 04/22/20  Evon SlackGaines, Thomas C, PA-C  oxyCODONE-acetaminophen (PERCOCET) 5-325 MG tablet Take 1 tablet by mouth every 6 (six) hours as needed for severe pain. 10/07/19 10/06/20  Sharman CheekStafford, Kahle Mcqueen, MD  pantoprazole (PROTONIX) 40  MG tablet Take 1 tablet (40 mg total) by mouth daily. 06/23/17 06/23/18  Emily FilbertWilliams, Jonathan E, MD  penicillin v potassium (VEETID) 250 MG tablet Take 1 tablet (250 mg total) by mouth 4 (four) times daily. 03/02/18   Jene EveryKinner, Robert, MD  predniSONE (STERAPRED UNI-PAK 21 TAB) 10 MG (21) TBPK tablet 6-day taper as directed. 02/03/17   Menshew, Charlesetta IvoryJenise V Bacon, PA-C  QUEtiapine (SEROQUEL) 25 MG tablet Take 1 tablet (25 mg total) by mouth 2 (two) times daily. 02/03/16   Ramonita LabGouru, Aruna, MD     Allergies Ambien [zolpidem tartrate], Ambien [zolpidem tartrate], Clindamycin/lincomycin, Keflex [cephalexin], Methylphenidate, Ritalin [methylphenidate hcl], Ritalin [methylphenidate hcl], and Zolpidem   Family History  Problem Relation Age of Onset  . Factor V Leiden deficiency Mother        deceased of drug overdose    Social History Social History   Tobacco Use  . Smoking status: Current Every Day Smoker    Packs/day: 1.00    Years: 20.00    Pack years: 20.00    Types: Cigarettes  . Smokeless tobacco: Never Used  Substance Use Topics  . Alcohol use: No  . Drug use: No    Comment: last use New Year's eve.    Review of Systems  Constitutional:   No fever or chills.  ENT:   No sore throat. No rhinorrhea. Cardiovascular:   No chest pain or syncope. Respiratory:   No dyspnea or cough. Gastrointestinal:   Negative for abdominal pain, vomiting and diarrhea.  Musculoskeletal: Right leg pain and swelling as above All other systems reviewed and are negative except as documented above in ROS and HPI.  ____________________________________________   PHYSICAL EXAM:  VITAL SIGNS: ED Triage Vitals  Enc Vitals Group     BP 10/07/19 1044 122/68     Pulse Rate 10/07/19 1044 68     Resp 10/07/19 1044 17     Temp 10/07/19 1044 98.4 F (36.9 C)     Temp Source 10/07/19 1455 Oral     SpO2 10/07/19 1044 98 %     Weight 10/07/19 1044 200 lb (90.7 kg)     Height 10/07/19 1044 5\' 4"  (1.626 m)     Head  Circumference --      Peak Flow --      Pain Score 10/07/19 1044 10     Pain Loc --      Pain Edu? --      Excl. in GC? --     Vital signs reviewed, nursing assessments reviewed.   Constitutional:   Alert and oriented. Non-toxic appearance. Eyes:   Conjunctivae are normal. EOMI. PERRL. ENT      Head:   Normocephalic and atraumatic.      Nose:   Wearing a mask.      Mouth/Throat:   Wearing a mask.      Neck:  No meningismus. Full ROM. Hematological/Lymphatic/Immunilogical:   No cervical lymphadenopathy. Cardiovascular:   RRR. Symmetric bilateral radial and DP pulses.  No murmurs. Cap refill less than 2 seconds. Respiratory:   Normal respiratory effort without tachypnea/retractions. Breath sounds are clear and equal bilaterally. No wheezes/rales/rhonchi. Gastrointestinal:   Soft and nontender. Non distended. There is no CVA tenderness.  No rebound, rigidity, or guarding.  Musculoskeletal:   Normal range of motion in all extremities. No joint effusions. Tenderness of the right lower leg from the mid calf and below to the ankle. No focal bony tenderness. No inflammatory skin changes. No significant edema. Calf circumference symmetric.Marland Kitchen Neurologic:   Normal speech and language.  Motor grossly intact. No acute focal neurologic deficits are appreciated.  Skin:    Skin is warm, dry and intact. No rash noted.  No petechiae, purpura, or bullae.  ____________________________________________    LABS (pertinent positives/negatives) (all labs ordered are listed, but only abnormal results are displayed) Labs Reviewed  BASIC METABOLIC PANEL - Abnormal; Notable for the following components:      Result Value   Glucose, Bld 111 (*)    Calcium 8.5 (*)    Anion gap 4 (*)    All other components within normal limits  CBC   ____________________________________________   EKG    ____________________________________________    RADIOLOGY  US Venous Img Lower Unilateral Right  Result  Date: 10/07/2019 CLINICAL DATA:  Right lower extremity pain and edema. History of left lower extremity DVT, currently on anticoagulation. Evaluate for acute or chronic DVT. EXAM: RIGHT LOWER EXTREMITY VENOUS DOPPLER ULTRASOUND TECHNIQUE: Gray-scale sonography with graded compression, as well as color Doppler and duplex ultrasound were performed to evaluate the lower extremity deep venous systems from the level of the common femoral vein and including the common femoral, femoral, profunda femoral, popliteal and calf veins including the posterior tibial, peroneal and gastrocnemius veins when visible. The superficial great saphenous vein was also interrogated. Spectral Doppler was utilized to evaluate flow at rest and with distal augmentation maneuvers in the common femoral, femoral and popliteal veins. COMPARISON:  Left lower extremity venous Doppler ultrasound-07/24/2019 (negative). FINDINGS: Contralateral Common Femoral Vein: There is a nonocclusive echogenic septation involving the left common femoral vein (image 3). Common Femoral Vein: No evidence of thrombus. Normal compressibility, respiratory phasicity and response to augmentation. Saphenofemoral Junction: No evidence of thrombus. Normal compressibility and flow on color Doppler imaging. Profunda Femoral Vein: No evidence of thrombus. Normal compressibility and flow on color Doppler imaging. Femoral Vein: No evidence of thrombus. Normal compressibility, respiratory phasicity and response to augmentation. Popliteal Vein: No evidence of thrombus. Normal compressibility, respiratory phasicity and response to augmentation. Calf Veins: Hypoechoic occlusive thrombus is seen involving both paired right posterior tibial veins (images 31 and 32). The peroneal veins appear patent where imaged. Superficial Great Saphenous Vein: No evidence of thrombus. Normal compressibility. Other Findings:  None. IMPRESSION: 1. The examination is positive for occlusive DVT involving  both paired right posterior tibial veins. There is no extension of this distal tibial DVT to the more proximal venous system of the right lower extremity. 2. Nonocclusive echogenic septation involving the contralateral left common femoral vein, nonspecific though given provided history likely represents the sequela of previous DVT. Electronically Signed   By: Simonne Come M.D.   On: 10/07/2019 12:13    ____________________________________________   PROCEDURES Procedures  ____________________________________________    CLINICAL IMPRESSION / ASSESSMENT AND PLAN / ED COURSE  Medications ordered in the ED: Medications  oxyCODONE-acetaminophen (  PERCOCET/ROXICET) 5-325 MG per tablet 1 tablet (1 tablet Oral Given 10/07/19 1452)    Pertinent labs & imaging results that were available during my care of the patient were reviewed by me and considered in my medical decision making (see chart for details).  Brianna Ashley was evaluated in Emergency Department on 10/07/2019 for the symptoms described in the history of present illness. She was evaluated in the context of the global COVID-19 pandemic, which necessitated consideration that the patient might be at risk for infection with the SARS-CoV-2 virus that causes COVID-19. Institutional protocols and algorithms that pertain to the evaluation of patients at risk for COVID-19 are in a state of rapid change based on information released by regulatory bodies including the CDC and federal and state organizations. These policies and algorithms were followed during the patient's care in the ED.   patient presents with right leg pain and swelling. No evidence of infection, no wounds. Ultrasound shows thrombosis in the PT veins without any proximal DVT.  Discussed with vascular surgery who agrees with continuing her Eliquis for now, recommend compression stockings and can follow-up with her in a week for further monitoring and consideration of possible IVC  filter placement.      ____________________________________________   FINAL CLINICAL IMPRESSION(S) / ED DIAGNOSES    Final diagnoses:  Thrombophlebitis of superficial veins of right lower extremity     ED Discharge Orders         Ordered    oxyCODONE-acetaminophen (PERCOCET) 5-325 MG tablet  Every 6 hours PRN     Discontinue  Reprint     10/07/19 1530          Portions of this note were generated with dragon dictation software. Dictation errors may occur despite best attempts at proofreading.   Sharman Cheek, MD 10/07/19 [phone]

## 2019-10-07 NOTE — Discharge Instructions (Addendum)
Keep taking your Eliquis as prescribed and be sure not to miss any doses.  Keep the leg elevated as much as possible for the next week.  It would also be helpful to obtain compression stockings to wear during the daytime.  Please follow up with vascular surgery within 1 week for further evaluation.

## 2019-10-07 NOTE — ED Triage Notes (Addendum)
Pt comes via POV from home with c/o possible clot in leg. Pt states intense pain in right leg. Pt states pain to ambulate. Pt states swelling.   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

## 2020-01-04 ENCOUNTER — Emergency Department: Payer: Self-pay

## 2020-01-04 ENCOUNTER — Other Ambulatory Visit: Payer: Self-pay

## 2020-01-04 ENCOUNTER — Emergency Department
Admission: EM | Admit: 2020-01-04 | Discharge: 2020-01-04 | Disposition: A | Discharge status: Home / Self Care | Payer: Self-pay | Attending: Emergency Medicine | Admitting: Emergency Medicine

## 2020-01-04 DIAGNOSIS — F17210 Nicotine dependence, cigarettes, uncomplicated: Secondary | ICD-10-CM | POA: Insufficient documentation

## 2020-01-04 DIAGNOSIS — J45909 Unspecified asthma, uncomplicated: Secondary | ICD-10-CM | POA: Insufficient documentation

## 2020-01-04 DIAGNOSIS — R0602 Shortness of breath: Secondary | ICD-10-CM | POA: Insufficient documentation

## 2020-01-04 DIAGNOSIS — G90522 Complex regional pain syndrome I of left lower limb: Secondary | ICD-10-CM | POA: Insufficient documentation

## 2020-01-04 DIAGNOSIS — Z20822 Contact with and (suspected) exposure to covid-19: Secondary | ICD-10-CM | POA: Insufficient documentation

## 2020-01-04 LAB — COMPREHENSIVE METABOLIC PANEL
ALT: 15 U/L (ref 0–44)
AST: 18 U/L (ref 15–41)
Albumin: 3.7 g/dL (ref 3.5–5.0)
Alkaline Phosphatase: 56 U/L (ref 38–126)
Anion gap: 7 (ref 5–15)
BUN: 9 mg/dL (ref 6–20)
CO2: 23 mmol/L (ref 22–32)
Calcium: 8.8 mg/dL — ABNORMAL LOW (ref 8.9–10.3)
Chloride: 106 mmol/L (ref 98–111)
Creatinine, Ser: 0.89 mg/dL (ref 0.44–1.00)
GFR, Estimated: >60 mL/min (ref >60)
Glucose, Bld: 111 mg/dL — ABNORMAL HIGH (ref 70–99)
Potassium: 4.2 mmol/L (ref 3.5–5.1)
Sodium: 136 mmol/L (ref 135–145)
Total Bilirubin: 0.9 mg/dL (ref 0.3–1.2)
Total Protein: 6.6 g/dL (ref 6.5–8.1)

## 2020-01-04 LAB — CBC WITH DIFFERENTIAL/PLATELET
Abs Immature Granulocytes: 0.03 10*3/uL (ref 0.00–0.07)
Basophils Absolute: 0.1 10*3/uL (ref 0.0–0.1)
Basophils Relative: 1 %
Eosinophils Absolute: 0.4 10*3/uL (ref 0.0–0.5)
Eosinophils Relative: 4 %
HCT: 39.7 % (ref 36.0–46.0)
Hemoglobin: 13.2 g/dL (ref 12.0–15.0)
Immature Granulocytes: 0 %
Lymphocytes Relative: 27 %
Lymphs Abs: 3 10*3/uL (ref 0.7–4.0)
MCH: 29.1 pg (ref 26.0–34.0)
MCHC: 33.2 g/dL (ref 30.0–36.0)
MCV: 87.6 fL (ref 80.0–100.0)
Monocytes Absolute: 0.7 10*3/uL (ref 0.1–1.0)
Monocytes Relative: 7 %
Neutro Abs: 6.8 10*3/uL (ref 1.7–7.7)
Neutrophils Relative %: 61 %
Platelets: 331 10*3/uL (ref 150–400)
RBC: 4.53 MIL/uL (ref 3.87–5.11)
RDW: 14.3 % (ref 11.5–15.5)
WBC: 11.1 10*3/uL — ABNORMAL HIGH (ref 4.0–10.5)
nRBC: 0 % (ref 0.0–0.2)

## 2020-01-04 LAB — RESPIRATORY PANEL BY RT PCR (FLU A&B, COVID)
Influenza A by PCR: NEGATIVE
Influenza B by PCR: NEGATIVE
SARS Coronavirus 2 by RT PCR: NEGATIVE

## 2020-01-04 LAB — POCT PREGNANCY, URINE: Preg Test, Ur: NEGATIVE

## 2020-01-04 MED ORDER — OXYCODONE-ACETAMINOPHEN 5-325 MG PO TABS
1.0000 | ORAL_TABLET | Freq: Once | ORAL | Status: AC
  Administered 2020-01-04: 1 via ORAL
  Filled 2020-01-04: qty 1

## 2020-01-04 MED ORDER — IOHEXOL 350 MG/ML SOLN
100.0000 mL | Freq: Once | INTRAVENOUS | Status: AC | PRN
  Administered 2020-01-04: 100 mL via INTRAVENOUS
  Filled 2020-01-04: qty 100

## 2020-01-04 MED ORDER — MORPHINE SULFATE (PF) 4 MG/ML IV SOLN
4.0000 mg | Freq: Once | INTRAVENOUS | Status: AC
  Administered 2020-01-04: 4 mg via INTRAVENOUS
  Filled 2020-01-04: qty 1

## 2020-01-04 MED ORDER — HYDROCODONE-ACETAMINOPHEN 5-325 MG PO TABS
1.0000 | ORAL_TABLET | ORAL | 0 refills | Status: AC | PRN
Start: 2020-01-04 — End: 2020-01-09

## 2020-01-04 MED ORDER — ONDANSETRON HCL 4 MG/2ML IJ SOLN
4.0000 mg | Freq: Once | INTRAMUSCULAR | Status: AC
  Administered 2020-01-04: 4 mg via INTRAVENOUS
  Filled 2020-01-04: qty 2

## 2020-01-04 MED ORDER — APIXABAN (ELIQUIS) VTE STARTER PACK (10MG AND 5MG): ORAL_TABLET | ORAL | 0 refills | Status: DC

## 2020-01-04 NOTE — ED Notes (Signed)
See triage note- pt here with sharp and shooting pain down entire left lower extremity. Swelling present from knee down to ankle. Describes pain as hot in back of calf, reports that the medial aspect of her foot and ankle "looked like a soft ball" last night. Pt in obvious discomfort, unable to bear weight on extremity. Cap refill >3 seconds, 1+ pedal pulse present.

## 2020-01-04 NOTE — ED Triage Notes (Signed)
Pt comes POV with left leg swelling, redness, pain for 2 days. Pt has hx of DVT. Has not taken her blood thinner in 2 months.

## 2020-01-04 NOTE — ED Notes (Signed)
US at bedside

## 2020-01-05 NOTE — ED Provider Notes (Signed)
[facility] Emergency Department Provider Note  ____________________________________________   First MD Initiated Contact with Patient 01/04/20 1537     (approximate)  I have reviewed the triage vital signs and the nursing notes.   HISTORY  Chief Complaint Leg Pain  HPI Brianna Ashley is a 34 y.o. female who presents to the emergency department for evaluation of acute onset of left leg pain, swelling and redness associated with shortness of breath for the last 2 to 3 days.  The patient has diabetes factor V Leiden disorder and has had a history of multiple blood clots of each of her legs as well as PEs in the past.  She states that she has been off of her Eliquis for the last 2 months because she was unable to afford the $500 charge for it.  Instead, at that time she switched to taking a daily baby aspirin.  She notes that while she has been having this leg pain over the last several days she has increased her aspirin intake as well as taken ibuprofen without any relief.  Pain is made worse with ambulation or with even light touch to the leg such as a blanket.  Pain is rated 10/10 and is most severe in the lower left calf.  Of note related to her shortness of breath, she states she did have a Covid exposure about 5 days ago but has had 2 negative Covid test since that time.         Past Medical History:  Diagnosis Date  . Asthma   . Carpal tunnel syndrome   . DVT of lower extremity (deep venous thrombosis) (HCC)   . Factor 5 Leiden mutation, heterozygous (HCC)   . Migraine   . Seizures [facility])     Patient Active Problem List   Diagnosis Date Noted  . Factor V Leiden (HCC) 03/28/2016  . Acute deep vein thrombosis (DVT) of femoral vein of left lower extremity (HCC) 03/28/2016  . Lupus anticoagulant positive 03/28/2016  . DVT (deep venous thrombosis) (HCC) 02/02/2016    Past Surgical History:  Procedure Laterality Date  . ABSCESS DRAINAGE    .  CESAREAN SECTION    . DILATION AND CURETTAGE OF UTERUS      Prior to Admission medications   Medication Sig Start Date End Date Taking? Authorizing Provider  albuterol (PROVENTIL HFA;VENTOLIN HFA) 108 (90 Base) MCG/ACT inhaler Inhale 2 puffs every 6 (six) hours as needed into the lungs for wheezing or shortness of breath. 02/03/17   Menshew, Charlesetta Ivory, PA-C  albuterol (PROVENTIL HFA;VENTOLIN HFA) 108 (90 Base) MCG/ACT inhaler Inhale 2 puffs into the lungs every 6 (six) hours as needed for wheezing or shortness of breath. 06/23/17   Emily Filbert, MD  APIXABAN Everlene Balls) VTE STARTER PACK (10MG  AND 5MG ) Take as directed on package: start with two-5mg  tablets twice daily for 7 days. On day 8, switch to one-5mg  tablet twice daily. 01/04/20   , PA  benzonatate (TESSALON PERLES) 100 MG capsule Take 1-2 tabs TID prn cough 02/03/17   Menshew, Lucy Chris, PA-C  citalopram (CELEXA) 40 MG tablet Take 1 tablet (40 mg total) by mouth daily. 02/04/16   Gouru, Charlesetta Ivory, MD  clonazePAM (KLONOPIN) 1 MG tablet Take 1 tablet (1 mg total) by mouth 3 (three) times daily. 02/03/16   Deanna Artis, MD  docusate sodium (COLACE) 100 MG capsule Take 1 capsule (100 mg total) by mouth 2 (two) times daily as needed  for mild constipation. 02/03/16   Gouru, Deanna ArtisAruna, MD  fluticasone (FLONASE) 50 MCG/ACT nasal spray Place 2 sprays daily into both nostrils. 02/03/17   Menshew, Charlesetta IvoryJenise V Bacon, PA-C  gabapentin (NEURONTIN) 100 MG capsule Take 1 capsule (100 mg total) by mouth 3 (three) times daily. 06/23/17 06/23/18  Emily FilbertWilliams, Jonathan E, MD  HYDROcodone-acetaminophen (NORCO) 5-325 MG tablet Take 1 tablet by mouth every 4 (four) hours as needed for up to 5 days for moderate pain. 01/04/20 01/09/20  Lucy Chrisodgers, Kaydi Kley J, PA  metroNIDAZOLE (FLAGYL) 500 MG tablet Take 1 tablet (500 mg total) by mouth 2 (two) times daily. 10/14/16   Cuthriell, Delorise RoyalsJonathan D, PA-C  ondansetron (ZOFRAN ODT) 4 MG disintegrating tablet Take 1  tablet (4 mg total) by mouth every 8 (eight) hours as needed for nausea or vomiting. 06/23/17   Emily FilbertWilliams, Jonathan E, MD  pantoprazole (PROTONIX) 40 MG tablet Take 1 tablet (40 mg total) by mouth daily. 06/23/17 06/23/18  Emily FilbertWilliams, Jonathan E, MD  penicillin v potassium (VEETID) 250 MG tablet Take 1 tablet (250 mg total) by mouth 4 (four) times daily. 03/02/18   Jene EveryKinner, Robert, MD  predniSONE (STERAPRED UNI-PAK 21 TAB) 10 MG (21) TBPK tablet 6-day taper as directed. 02/03/17   Menshew, Charlesetta IvoryJenise V Bacon, PA-C  QUEtiapine (SEROQUEL) 25 MG tablet Take 1 tablet (25 mg total) by mouth 2 (two) times daily. 02/03/16   Ramonita LabGouru, Aruna, MD    Allergies Ambien [zolpidem tartrate], Ambien [zolpidem tartrate], Clindamycin/lincomycin, Keflex [cephalexin], Methylphenidate, Ritalin [methylphenidate hcl], Ritalin [methylphenidate hcl], and Zolpidem  Family History  Problem Relation Age of Onset  . Factor V Leiden deficiency Mother        deceased of drug overdose    Social History Social History   Tobacco Use  . Smoking status: Current Every Day Smoker    Packs/day: 1.00    Years: 20.00    Pack years: 20.00    Types: Cigarettes  . Smokeless tobacco: Never Used  Substance Use Topics  . Alcohol use: No  . Drug use: No    Comment: last use New Year's eve.    Review of Systems Constitutional: No fever/chills Eyes: No visual changes. ENT: No sore throat. Cardiovascular: Denies chest pain. Respiratory: + shortness of breath, denies cough Gastrointestinal: No abdominal pain.  No nausea, no vomiting.  No diarrhea.  No constipation. Genitourinary: Negative for dysuria. Musculoskeletal: + Left leg pain and swelling, negative for back pain. Skin: + Redness left leg Neurological: Negative for headaches, focal weakness or numbness.  ____________________________________________   PHYSICAL EXAM:  VITAL SIGNS: ED Triage Vitals [01/04/20 1527]  Enc Vitals Group     BP (!) 108/50     Pulse Rate 80     Resp  18     Temp 98.5 F (36.9 C)     Temp Source Oral     SpO2 100 %     Weight 240 lb (108.9 kg)     Height 5\' 4"  (1.626 m)     Head Circumference      Peak Flow      Pain Score 10     Pain Loc      Pain Edu?      Excl. in GC?     Constitutional: Alert and oriented.  Anxious appearing and grimacing Eyes: Conjunctivae are normal. PERRL. EOMI. Head: Atraumatic. Nose: No congestion/rhinnorhea. Mouth/Throat: Mucous membranes are moist.   Neck: No stridor.   Cardiovascular: Normal rate, regular rhythm. Grossly normal heart sounds.  Good  peripheral circulation. Respiratory: Normal respiratory effort.  No retractions. Lungs CTAB. Gastrointestinal: Soft and nontender. No distention. No abdominal bruits. No CVA tenderness. Musculoskeletal: There is exquisite tenderness of the left lower extremity, most prominent inferior to the knee.  There is apparent swelling when compared to the contralateral side.  There is erythema on the posterior lower half of the calf that is macular.  There is no perceived warmth of the area as compared to the contralateral side.  Capillary refill is mildly delayed at approximately 5 seconds on the left compared to less than 3 seconds on the right.  The patient has intact motor capabilities of the left lower leg, these all significantly increased pain. Neurologic:  Normal speech and language. No gross focal neurologic deficits are appreciated. No gait instability. Skin:  Skin is warm, dry and intact. No rash noted. Psychiatric: Patient appears distressed. Speech and behavior are normal.  ____________________________________________   LABS (all labs ordered are listed, but only abnormal results are displayed)  Labs Reviewed  CBC WITH DIFFERENTIAL/PLATELET - Abnormal; Notable for the following components:      Result Value   WBC 11.1 (*)    All other components within normal limits  COMPREHENSIVE METABOLIC PANEL - Abnormal; Notable for the following components:    Glucose, Bld 111 (*)    Calcium 8.8 (*)    All other components within normal limits  RESPIRATORY PANEL BY RT PCR (FLU A&B, COVID)  POC URINE PREG, ED  POCT PREGNANCY, URINE   ____________________________________________  RADIOLOGY  Official radiology report(s): CT Angio Chest PE W and/or Wo Contrast  Result Date: 01/04/2020 CLINICAL DATA:  PE suspected, high pretest probability. Left leg redness, swelling and pain for 2 days, history of DVT, not taking anticoagulation for past 2 months, shortness of breath, negative COVID-19 testing EXAM: CT ANGIOGRAPHY CHEST WITH CONTRAST TECHNIQUE: Multidetector CT imaging of the chest was performed using the standard protocol during bolus administration of intravenous contrast. Multiplanar CT image reconstructions and MIPs were obtained to evaluate the vascular anatomy. CONTRAST:  OMNIPAQUE IOHEXOL 350 MG/ML SOLN COMPARISON:  CT 02/02/2016 FINDINGS: Cardiovascular: Satisfactory opacification the pulmonary arteries to the segmental level. No pulmonary artery filling defects are identified. Central pulmonary arteries are normal caliber. Normal heart size. No pericardial effusion. Small volume fluid in the pericardial recesses within physiologic normal. Suboptimal opacification of the thoracic aorta. The aortic root is suboptimally assessed given cardiac pulsation artifact. The aorta is normal caliber. No visible acute luminal abnormality of the imaged aorta. No periaortic stranding or hemorrhage. Normal 3 vessel branching of the aortic arch. Proximal great vessels are unremarkable. Mediastinum/Nodes: No mediastinal fluid or gas. Normal thyroid gland and thoracic inlet. No acute abnormality of the trachea or esophagus. No worrisome mediastinal, hilar or axillary adenopathy. Lungs/Pleura: Mild atelectatic changes posteriorly. No consolidation, features of edema, pneumothorax, or effusion. No suspicious pulmonary nodules or masses. Some geographic regions of  mosaic attenuation are similar to comparison study and favored to reflect some small airways disease related to history of asthma. Upper Abdomen: No acute abnormalities present in the visualized portions of the upper abdomen. Musculoskeletal: No acute osseous abnormality or suspicious osseous lesion. Mild discogenic changes in the thoracic spine. No worrisome chest wall lesions. Review of the MIP images confirms the above findings. IMPRESSION: 1. No evidence of pulmonary embolism. 2. No acute intrathoracic process. 3. Stable mosaic attenuation are favored to reflect small airways disease related to history of asthma. Electronically Signed   By: Kreg Shropshire  M.D.   On: 01/04/2020 17:43   CT PELVIS W CONTRAST  Result Date: 01/04/2020 CLINICAL DATA:  Left leg redness, swelling and pain for 2 days, history of DVT, not taking anticoagulation for past 2 months, shortness of breath, negative COVID-19 testing EXAM: CT PELVIS AND LEFT LOWER EXTREMITY WITH CONTRAST Multidetector CT imaging of the pelvis and left lower extremity was performed according to the standard protocol following bolus administration of intravenous contrast. Multiplanar CT image reconstructions were also generated. CONTRAST:  OMNIPAQUE IOHEXOL 350 MG/ML SOLN COMPARISON:  Ultrasound left lower extremity 01/04/2020. CT pelvis 04/23/2019. FINDINGS: CT PELVIS FINDINGS Urinary Tract: No abnormality visualized. There is no urothelial wall thickening and there are no filling defects in the opacified portions of the bilateral ureters and urinary bladder lumen. Bowel: Unremarkable visualized pelvic bowel loops. The appendix is normal. Vascular/Lymphatic: No pathologically enlarged lymph nodes. No significant vascular abnormality seen. Reproductive: No mass or other significant abnormality. Two corpus luteum cyst noted within the right ovary Other:  None. Musculoskeletal: No suspicious bone lesions identified. CT LEFT LOWER EXTREMITY FINDINGS  Bones/Joint/Cartilage No suspicious lytic or blastic osseous lesions. No cortical destruction or erosion. No acute displaced fracture or dislocation of the visualized bones. Ligaments Suboptimally assessed by CT. Muscles and Tendons Unremarkable. Soft tissues Mild subcutaneus soft tissue edema of the leg. No mass lesion. No organized fluid collection. No subcutaneus soft tissue emphysema. No joint effusion. No popliteal cyst identified. Vasculature: Varicose veins within the left lower extremity. Otherwise, no significant vascular abnormality seen. No findings to suggest deep venous thrombosis. No aneurysmal dilatation of the arterial system on this venous contrast study. IMPRESSION: 1. Mild subcutaneus soft tissue edema of the left leg. 2. Varicose veins of the left lower extremity. 3. Otherwise unremarkable CT pelvis and left lower extremity evaluation. Electronically Signed   By: Tish Frederickson M.D.   On: 01/04/2020 19:52   CT FEMUR LEFT W CONTRAST  Result Date: 01/04/2020 CLINICAL DATA:  Left leg redness, swelling and pain for 2 days, history of DVT, not taking anticoagulation for past 2 months, shortness of breath, negative COVID-19 testing EXAM: CT PELVIS AND LEFT LOWER EXTREMITY WITH CONTRAST Multidetector CT imaging of the pelvis and left lower extremity was performed according to the standard protocol following bolus administration of intravenous contrast. Multiplanar CT image reconstructions were also generated. CONTRAST:  OMNIPAQUE IOHEXOL 350 MG/ML SOLN COMPARISON:  Ultrasound left lower extremity 01/04/2020. CT pelvis 04/23/2019. FINDINGS: CT PELVIS FINDINGS Urinary Tract: No abnormality visualized. There is no urothelial wall thickening and there are no filling defects in the opacified portions of the bilateral ureters and urinary bladder lumen. Bowel: Unremarkable visualized pelvic bowel loops. The appendix is normal. Vascular/Lymphatic: No pathologically enlarged lymph nodes. No  significant vascular abnormality seen. Reproductive: No mass or other significant abnormality. Two corpus luteum cyst noted within the right ovary Other:  None. Musculoskeletal: No suspicious bone lesions identified. CT LEFT LOWER EXTREMITY FINDINGS Bones/Joint/Cartilage No suspicious lytic or blastic osseous lesions. No cortical destruction or erosion. No acute displaced fracture or dislocation of the visualized bones. Ligaments Suboptimally assessed by CT. Muscles and Tendons Unremarkable. Soft tissues Mild subcutaneus soft tissue edema of the leg. No mass lesion. No organized fluid collection. No subcutaneus soft tissue emphysema. No joint effusion. No popliteal cyst identified. Vasculature: Varicose veins within the left lower extremity. Otherwise, no significant vascular abnormality seen. No findings to suggest deep venous thrombosis. No aneurysmal dilatation of the arterial system on this venous contrast study. IMPRESSION: 1.  Mild subcutaneus soft tissue edema of the left leg. 2. Varicose veins of the left lower extremity. 3. Otherwise unremarkable CT pelvis and left lower extremity evaluation. Electronically Signed   By: Tish Frederickson M.D.   On: 01/04/2020 19:52   CT Tibia Fibula Left Wo Contrast  Result Date: 01/04/2020 CLINICAL DATA:  Left leg redness, swelling and pain for 2 days, history of DVT, not taking anticoagulation for past 2 months, shortness of breath, negative COVID-19 testing EXAM: CT PELVIS AND LEFT LOWER EXTREMITY WITH CONTRAST Multidetector CT imaging of the pelvis and left lower extremity was performed according to the standard protocol following bolus administration of intravenous contrast. Multiplanar CT image reconstructions were also generated. CONTRAST:  OMNIPAQUE IOHEXOL 350 MG/ML SOLN COMPARISON:  Ultrasound left lower extremity 01/04/2020. CT pelvis 04/23/2019. FINDINGS: CT PELVIS FINDINGS Urinary Tract: No abnormality visualized. There is no urothelial wall thickening  and there are no filling defects in the opacified portions of the bilateral ureters and urinary bladder lumen. Bowel: Unremarkable visualized pelvic bowel loops. The appendix is normal. Vascular/Lymphatic: No pathologically enlarged lymph nodes. No significant vascular abnormality seen. Reproductive: No mass or other significant abnormality. Two corpus luteum cyst noted within the right ovary Other:  None. Musculoskeletal: No suspicious bone lesions identified. CT LEFT LOWER EXTREMITY FINDINGS Bones/Joint/Cartilage No suspicious lytic or blastic osseous lesions. No cortical destruction or erosion. No acute displaced fracture or dislocation of the visualized bones. Ligaments Suboptimally assessed by CT. Muscles and Tendons Unremarkable. Soft tissues Mild subcutaneus soft tissue edema of the leg. No mass lesion. No organized fluid collection. No subcutaneus soft tissue emphysema. No joint effusion. No popliteal cyst identified. Vasculature: Varicose veins within the left lower extremity. Otherwise, no significant vascular abnormality seen. No findings to suggest deep venous thrombosis. No aneurysmal dilatation of the arterial system on this venous contrast study. IMPRESSION: 1. Mild subcutaneus soft tissue edema of the left leg. 2. Varicose veins of the left lower extremity. 3. Otherwise unremarkable CT pelvis and left lower extremity evaluation. Electronically Signed   By: Tish Frederickson M.D.   On: 01/04/2020 19:52   CT FOOT LEFT W CONTRAST  Result Date: 01/04/2020 CLINICAL DATA:  Left leg redness, swelling and pain for 2 days, history of DVT, not taking anticoagulation for past 2 months, shortness of breath, negative COVID-19 testing EXAM: CT PELVIS AND LEFT LOWER EXTREMITY WITH CONTRAST Multidetector CT imaging of the pelvis and left lower extremity was performed according to the standard protocol following bolus administration of intravenous contrast. Multiplanar CT image reconstructions were also generated.  CONTRAST:  OMNIPAQUE IOHEXOL 350 MG/ML SOLN COMPARISON:  Ultrasound left lower extremity 01/04/2020. CT pelvis 04/23/2019. FINDINGS: CT PELVIS FINDINGS Urinary Tract: No abnormality visualized. There is no urothelial wall thickening and there are no filling defects in the opacified portions of the bilateral ureters and urinary bladder lumen. Bowel: Unremarkable visualized pelvic bowel loops. The appendix is normal. Vascular/Lymphatic: No pathologically enlarged lymph nodes. No significant vascular abnormality seen. Reproductive: No mass or other significant abnormality. Two corpus luteum cyst noted within the right ovary Other:  None. Musculoskeletal: No suspicious bone lesions identified. CT LEFT LOWER EXTREMITY FINDINGS Bones/Joint/Cartilage No suspicious lytic or blastic osseous lesions. No cortical destruction or erosion. No acute displaced fracture or dislocation of the visualized bones. Ligaments Suboptimally assessed by CT. Muscles and Tendons Unremarkable. Soft tissues Mild subcutaneus soft tissue edema of the leg. No mass lesion. No organized fluid collection. No subcutaneus soft tissue emphysema. No joint effusion. No popliteal  cyst identified. Vasculature: Varicose veins within the left lower extremity. Otherwise, no significant vascular abnormality seen. No findings to suggest deep venous thrombosis. No aneurysmal dilatation of the arterial system on this venous contrast study. IMPRESSION: 1. Mild subcutaneus soft tissue edema of the left leg. 2. Varicose veins of the left lower extremity. 3. Otherwise unremarkable CT pelvis and left lower extremity evaluation. Electronically Signed   By: Tish Frederickson M.D.   On: 01/04/2020 19:52   US Venous Img Lower Unilateral Left  Result Date: 01/04/2020 CLINICAL DATA:  Left leg swelling and pain 2 days. EXAM: Left LOWER EXTREMITY VENOUS DOPPLER ULTRASOUND TECHNIQUE: Gray-scale sonography with compression, as well as color and duplex ultrasound, were  performed to evaluate the deep venous system(s) from the level of the common femoral vein through the popliteal and proximal calf veins. COMPARISON:  None. FINDINGS: VENOUS Normal compressibility of the common femoral, superficial femoral, and popliteal veins, as well as the visualized calf veins. Visualized portions of profunda femoral vein and great saphenous vein unremarkable. No filling defects to suggest DVT on grayscale or color Doppler imaging. Doppler waveforms show normal direction of venous flow, normal respiratory plasticity and response to augmentation. Limited views of the contralateral common femoral vein are unremarkable. OTHER None. Limitations: none IMPRESSION: Negative. Electronically Signed   By: Marlan Palau M.D.   On: 01/04/2020 16:28    ____________________________________________   INITIAL IMPRESSION / ASSESSMENT AND PLAN / ED COURSE  As part of my medical decision making, I reviewed the following data within the electronic MEDICAL RECORD NUMBER Nursing notes reviewed and incorporated, Labs reviewed, Old chart reviewed, Discussed with radiologist, Evaluated by EM attending Dr. Erma Heritage and Notes from prior ED visits        Patient is a 34 year old female who presents to the emergency department for evaluation of 3 days of severe left leg pain with associated swelling and redness as well as shortness of breath.  She has a history of multiple blood clots before with factor V Leiden disorder and has been off of her Eliquis for the last 2 months.  Physical exam does reveal a mildly swollen left lower extremity that is exquisitely tender to even soft touch and has some erythema on the posterior aspect, concerning for possible DVT.  Evaluation in the emergency department began with a ultrasound DVT study which was negative.  The patient also received a CT angio due to her complaints associated with shortness of breath and this was negative for any acute PE.  After still having somewhat of  a strong clinical suspicion for DVT, the case was discussed with Dr. Elvera Maria of radiology to determine if there are any other appropriate next tests.  He recommended performing a CT of the left lower extremity with contrast given that her ultrasound study was limited due to her not tolerating much compressibility of the veins.  The CT exams of the left lower extremity were also negative for any acute clot.  The case was discussed with Dr. Erma Heritage, who also came and evaluated the patient.  There is most likely given her negative imaging today that she is suffering from reflex sympathetic dystrophy caused by prior clots.  She will be restarted on her Eliquis with a starter pack and she was given a cost savings card for the first 30 days.  She was also given the information for medication management for follow-up to determine other ways to afford her medications.  She also was given a very short course of  Norco for her pain.  The work-up was explained to the patient as well as detailed discussion regarding her diagnosis.  The patient is understanding and is amenable at this time with outpatient therapy.  She will follow-up with medication management as well as her primary care for further monitoring.  The patient is stable at this time for discharge.      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 She was evaluated in the context of the global COVID-19 pandemic, which necessitated consideration that the patient might be at  risk for infection with the SARS-CoV-2 virus that causes COVID-19. Institutional protocols and algorithms that pertain to the evaluation of patients at risk for COVID-19 are in a state of rapid change based on information released by regulatory bodies including the CDC and federal and state organizations. These policies and algorithms were followed during the patient's care in the ED.  Some ED evaluations and interventions may be delayed as a result of limited staffing during and the pandemic.*   Note:  This document was prepared using Dragon voice recognition software and may include unintentional dictation errors.      Lucy Chris, PA 01/05/20 1707    Shaune Pollack, MD 01/10/20 [id number]

## 2020-03-26 ENCOUNTER — Emergency Department
Admission: EM | Admit: 2020-03-26 | Discharge: 2020-03-26 | Disposition: A | Discharge status: Home / Self Care | Payer: Self-pay | Attending: Emergency Medicine | Admitting: Emergency Medicine

## 2020-03-26 ENCOUNTER — Other Ambulatory Visit: Payer: Self-pay

## 2020-03-26 ENCOUNTER — Emergency Department: Payer: Self-pay

## 2020-03-26 DIAGNOSIS — Y92410 Unspecified street and highway as the place of occurrence of the external cause: Secondary | ICD-10-CM | POA: Insufficient documentation

## 2020-03-26 DIAGNOSIS — S29012A Strain of muscle and tendon of back wall of thorax, initial encounter: Secondary | ICD-10-CM | POA: Diagnosis not present

## 2020-03-26 DIAGNOSIS — S161XXA Strain of muscle, fascia and tendon at neck level, initial encounter: Secondary | ICD-10-CM | POA: Insufficient documentation

## 2020-03-26 DIAGNOSIS — Z7901 Long term (current) use of anticoagulants: Secondary | ICD-10-CM | POA: Diagnosis not present

## 2020-03-26 DIAGNOSIS — S29019A Strain of muscle and tendon of unspecified wall of thorax, initial encounter: Secondary | ICD-10-CM

## 2020-03-26 DIAGNOSIS — F17210 Nicotine dependence, cigarettes, uncomplicated: Secondary | ICD-10-CM | POA: Diagnosis not present

## 2020-03-26 DIAGNOSIS — Z79899 Other long term (current) drug therapy: Secondary | ICD-10-CM | POA: Diagnosis not present

## 2020-03-26 DIAGNOSIS — S199XXA Unspecified injury of neck, initial encounter: Secondary | ICD-10-CM | POA: Diagnosis present

## 2020-03-26 DIAGNOSIS — J45909 Unspecified asthma, uncomplicated: Secondary | ICD-10-CM | POA: Insufficient documentation

## 2020-03-26 MED ORDER — NAPROXEN 500 MG PO TABS
500.0000 mg | ORAL_TABLET | Freq: Once | ORAL | Status: AC
  Administered 2020-03-26: 500 mg via ORAL
  Filled 2020-03-26: qty 1

## 2020-03-26 MED ORDER — CYCLOBENZAPRINE HCL 10 MG PO TABS
10.0000 mg | ORAL_TABLET | Freq: Once | ORAL | Status: AC
  Administered 2020-03-26: 10 mg via ORAL
  Filled 2020-03-26: qty 1

## 2020-03-26 MED ORDER — METHOCARBAMOL 500 MG PO TABS: 500.0000 mg | ORAL_TABLET | Freq: Four times a day (QID) | ORAL | 0 refills | Status: DC | PRN

## 2020-03-26 MED ORDER — NAPROXEN 500 MG PO TABS: 500.0000 mg | ORAL_TABLET | Freq: Two times a day (BID) | ORAL | 0 refills | Status: DC

## 2020-03-26 MED ORDER — OXYCODONE HCL 5 MG PO TABS
5.0000 mg | ORAL_TABLET | Freq: Once | ORAL | Status: AC
  Administered 2020-03-26: 5 mg via ORAL
  Filled 2020-03-26: qty 1
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
## 2020-03-26 NOTE — Discharge Instructions (Signed)
Please take the muscle relaxer as prescribed. You may also take tylenol.  Follow up with primary care or return to the ER for symptoms of concern.
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
## 2020-03-31 ENCOUNTER — Other Ambulatory Visit: Payer: Self-pay

## 2020-03-31 ENCOUNTER — Emergency Department
Admission: EM | Admit: 2020-03-31 | Discharge: 2020-03-31 | Disposition: A | Discharge status: Home / Self Care | Payer: No Typology Code available for payment source | Attending: Emergency Medicine | Admitting: Emergency Medicine

## 2020-03-31 ENCOUNTER — Emergency Department: Payer: No Typology Code available for payment source

## 2020-03-31 DIAGNOSIS — N10 Acute tubulo-interstitial nephritis: Secondary | ICD-10-CM | POA: Insufficient documentation

## 2020-03-31 DIAGNOSIS — R109 Unspecified abdominal pain: Secondary | ICD-10-CM

## 2020-03-31 DIAGNOSIS — J45909 Unspecified asthma, uncomplicated: Secondary | ICD-10-CM | POA: Insufficient documentation

## 2020-03-31 DIAGNOSIS — F17210 Nicotine dependence, cigarettes, uncomplicated: Secondary | ICD-10-CM | POA: Insufficient documentation

## 2020-03-31 DIAGNOSIS — N12 Tubulo-interstitial nephritis, not specified as acute or chronic: Secondary | ICD-10-CM

## 2020-03-31 DIAGNOSIS — R1030 Lower abdominal pain, unspecified: Secondary | ICD-10-CM | POA: Diagnosis present

## 2020-03-31 DIAGNOSIS — Z7901 Long term (current) use of anticoagulants: Secondary | ICD-10-CM | POA: Diagnosis not present

## 2020-03-31 DIAGNOSIS — Z7952 Long term (current) use of systemic steroids: Secondary | ICD-10-CM | POA: Diagnosis not present

## 2020-03-31 LAB — COMPREHENSIVE METABOLIC PANEL
ALT: 10 U/L (ref 0–44)
AST: 14 U/L — ABNORMAL LOW (ref 15–41)
Albumin: 4.1 g/dL (ref 3.5–5.0)
Alkaline Phosphatase: 66 U/L (ref 38–126)
Anion gap: 9 (ref 5–15)
BUN: 14 mg/dL (ref 6–20)
CO2: 22 mmol/L (ref 22–32)
Calcium: 9.4 mg/dL (ref 8.9–10.3)
Chloride: 106 mmol/L (ref 98–111)
Creatinine, Ser: 0.81 mg/dL (ref 0.44–1.00)
GFR, Estimated: >60 mL/min (ref >60)
Glucose, Bld: 114 mg/dL — ABNORMAL HIGH (ref 70–99)
Potassium: 4.3 mmol/L (ref 3.5–5.1)
Sodium: 137 mmol/L (ref 135–145)
Total Bilirubin: 0.6 mg/dL (ref 0.3–1.2)
Total Protein: 7.5 g/dL (ref 6.5–8.1)

## 2020-03-31 LAB — URINALYSIS, COMPLETE (UACMP) WITH MICROSCOPIC
Bilirubin Urine: NEGATIVE
Glucose, UA: NEGATIVE mg/dL
Hgb urine dipstick: NEGATIVE
Ketones, ur: NEGATIVE mg/dL
Nitrite: NEGATIVE
Protein, ur: 100 mg/dL — AB
Specific Gravity, Urine: 1.032 — ABNORMAL HIGH (ref 1.005–1.030)
pH: 5 (ref 5.0–8.0)

## 2020-03-31 LAB — CBC
HCT: 38.6 % (ref 36.0–46.0)
Hemoglobin: 13.1 g/dL (ref 12.0–15.0)
MCH: 28.7 pg (ref 26.0–34.0)
MCHC: 33.9 g/dL (ref 30.0–36.0)
MCV: 84.6 fL (ref 80.0–100.0)
Platelets: 413 10*3/uL — ABNORMAL HIGH (ref 150–400)
RBC: 4.56 MIL/uL (ref 3.87–5.11)
RDW: 14 % (ref 11.5–15.5)
WBC: 14.1 10*3/uL — ABNORMAL HIGH (ref 4.0–10.5)
nRBC: 0 % (ref 0.0–0.2)

## 2020-03-31 LAB — LIPASE, BLOOD: Lipase: 32 U/L (ref 11–51)

## 2020-03-31 MED ORDER — ONDANSETRON 4 MG PO TBDP
4.0000 mg | ORAL_TABLET | Freq: Once | ORAL | Status: AC
  Administered 2020-03-31: 4 mg via ORAL
  Filled 2020-03-31: qty 1

## 2020-03-31 MED ORDER — LEVOFLOXACIN 750 MG PO TABS
750.0000 mg | ORAL_TABLET | Freq: Once | ORAL | Status: AC
  Administered 2020-03-31: 750 mg via ORAL
  Filled 2020-03-31: qty 1

## 2020-03-31 MED ORDER — MORPHINE SULFATE (PF) 4 MG/ML IV SOLN
4.0000 mg | Freq: Once | INTRAVENOUS | Status: AC
  Administered 2020-03-31: 4 mg via INTRAVENOUS
  Filled 2020-03-31: qty 1

## 2020-03-31 MED ORDER — IOHEXOL 300 MG/ML  SOLN
100.0000 mL | Freq: Once | INTRAMUSCULAR | Status: AC | PRN
  Administered 2020-03-31: 100 mL via INTRAVENOUS
  Filled 2020-03-31: qty 100

## 2020-03-31 MED ORDER — LEVOFLOXACIN 750 MG PO TABS: 750.0000 mg | ORAL_TABLET | Freq: Every day | ORAL | 0 refills | Status: AC

## 2020-03-31 NOTE — ED Notes (Signed)
Attempt x 2 PIV unsuccessful.

## 2020-03-31 NOTE — Discharge Instructions (Addendum)
Take Levaquin once daily for the next five days.
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
## 2020-04-01 ENCOUNTER — Telehealth: Payer: Self-pay | Admitting: Emergency Medicine

## 2020-04-01 MED ORDER — SULFAMETHOXAZOLE-TRIMETHOPRIM 800-160 MG PO TABS: 1.0000 | ORAL_TABLET | Freq: Two times a day (BID) | ORAL | 0 refills | Status: AC

## 2020-04-01 NOTE — Telephone Encounter (Cosign Needed)
Patient requesting medication adjustment due to cost.

## 2020-04-04 ENCOUNTER — Other Ambulatory Visit: Payer: Self-pay

## 2020-06-13 ENCOUNTER — Ambulatory Visit: Payer: Self-pay | Attending: Family Medicine

## 2020-06-18 ENCOUNTER — Ambulatory Visit: Payer: Self-pay

## 2020-07-14 ENCOUNTER — Other Ambulatory Visit: Payer: Self-pay

## 2020-07-14 ENCOUNTER — Emergency Department: Payer: Self-pay

## 2020-07-14 ENCOUNTER — Emergency Department
Admission: EM | Admit: 2020-07-14 | Discharge: 2020-07-14 | Disposition: A | Discharge status: Home / Self Care | Payer: Self-pay | Attending: Emergency Medicine | Admitting: Emergency Medicine

## 2020-07-14 DIAGNOSIS — M79605 Pain in left leg: Secondary | ICD-10-CM | POA: Insufficient documentation

## 2020-07-14 DIAGNOSIS — Z7901 Long term (current) use of anticoagulants: Secondary | ICD-10-CM | POA: Insufficient documentation

## 2020-07-14 DIAGNOSIS — J45909 Unspecified asthma, uncomplicated: Secondary | ICD-10-CM | POA: Insufficient documentation

## 2020-07-14 DIAGNOSIS — R600 Localized edema: Secondary | ICD-10-CM | POA: Insufficient documentation

## 2020-07-14 DIAGNOSIS — K612 Anorectal abscess: Secondary | ICD-10-CM

## 2020-07-14 DIAGNOSIS — F17210 Nicotine dependence, cigarettes, uncomplicated: Secondary | ICD-10-CM | POA: Insufficient documentation

## 2020-07-14 LAB — CBC WITH DIFFERENTIAL/PLATELET
Abs Immature Granulocytes: 0.03 10*3/uL (ref 0.00–0.07)
Basophils Absolute: 0.1 10*3/uL (ref 0.0–0.1)
Basophils Relative: 1 %
Eosinophils Absolute: 0.4 10*3/uL (ref 0.0–0.5)
Eosinophils Relative: 3 %
HCT: 36.9 % (ref 36.0–46.0)
Hemoglobin: 12.1 g/dL (ref 12.0–15.0)
Immature Granulocytes: 0 %
Lymphocytes Relative: 27 %
Lymphs Abs: 3.7 10*3/uL (ref 0.7–4.0)
MCH: 28.5 pg (ref 26.0–34.0)
MCHC: 32.8 g/dL (ref 30.0–36.0)
MCV: 86.8 fL (ref 80.0–100.0)
Monocytes Absolute: 1 10*3/uL (ref 0.1–1.0)
Monocytes Relative: 8 %
Neutro Abs: 8.4 10*3/uL — ABNORMAL HIGH (ref 1.7–7.7)
Neutrophils Relative %: 61 %
Platelets: 324 10*3/uL (ref 150–400)
RBC: 4.25 MIL/uL (ref 3.87–5.11)
RDW: 14.5 % (ref 11.5–15.5)
WBC: 13.5 10*3/uL — ABNORMAL HIGH (ref 4.0–10.5)
nRBC: 0 % (ref 0.0–0.2)

## 2020-07-14 LAB — COMPREHENSIVE METABOLIC PANEL
ALT: 14 U/L (ref 0–44)
AST: 18 U/L (ref 15–41)
Albumin: 3.8 g/dL (ref 3.5–5.0)
Alkaline Phosphatase: 86 U/L (ref 38–126)
Anion gap: 8 (ref 5–15)
BUN: 14 mg/dL (ref 6–20)
CO2: 22 mmol/L (ref 22–32)
Calcium: 9.2 mg/dL (ref 8.9–10.3)
Chloride: 106 mmol/L (ref 98–111)
Creatinine, Ser: 0.88 mg/dL (ref 0.44–1.00)
GFR, Estimated: >60 mL/min (ref >60)
Glucose, Bld: 100 mg/dL — ABNORMAL HIGH (ref 70–99)
Potassium: 4.1 mmol/L (ref 3.5–5.1)
Sodium: 136 mmol/L (ref 135–145)
Total Bilirubin: 0.5 mg/dL (ref 0.3–1.2)
Total Protein: 7.2 g/dL (ref 6.5–8.1)

## 2020-07-14 MED ORDER — MORPHINE SULFATE (PF) 2 MG/ML IV SOLN
2.0000 mg | Freq: Once | INTRAVENOUS | Status: AC
  Administered 2020-07-14: 2 mg via INTRAVENOUS
  Filled 2020-07-14: qty 1

## 2020-07-14 MED ORDER — KETOROLAC TROMETHAMINE 10 MG PO TABS: 10.0000 mg | ORAL_TABLET | Freq: Four times a day (QID) | ORAL | 0 refills | Status: DC | PRN

## 2020-07-14 MED ORDER — SODIUM CHLORIDE 0.9 % IV BOLUS
1000.0000 mL | Freq: Once | INTRAVENOUS | Status: AC
  Administered 2020-07-14: 1000 mL via INTRAVENOUS

## 2020-07-14 MED ORDER — VANCOMYCIN HCL IN DEXTROSE 1-5 GM/200ML-% IV SOLN: 1000.0000 mg | Freq: Once | INTRAVENOUS | Status: DC

## 2020-07-14 MED ORDER — AMOXICILLIN-POT CLAVULANATE 875-125 MG PO TABS: 1.0000 | ORAL_TABLET | Freq: Two times a day (BID) | ORAL | 0 refills | Status: AC

## 2020-07-14 MED ORDER — VANCOMYCIN HCL IN DEXTROSE 1-5 GM/200ML-% IV SOLN
1000.0000 mg | Freq: Once | INTRAVENOUS | Status: AC
  Administered 2020-07-14: 1000 mg via INTRAVENOUS
  Filled 2020-07-14: qty 200

## 2020-07-14 MED ORDER — SODIUM CHLORIDE 0.9 % IV SOLN
2.0000 g | Freq: Once | INTRAVENOUS | Status: AC
  Administered 2020-07-14: 2 g via INTRAVENOUS
  Filled 2020-07-14: qty 2

## 2020-07-14 MED ORDER — LACTATED RINGERS IV SOLN: INTRAVENOUS | Status: DC

## 2020-07-14 MED ORDER — KETOROLAC TROMETHAMINE 30 MG/ML IJ SOLN
30.0000 mg | Freq: Once | INTRAMUSCULAR | Status: AC
  Administered 2020-07-14: 30 mg via INTRAVENOUS
  Filled 2020-07-14: qty 1

## 2020-07-14 MED ORDER — IOHEXOL 300 MG/ML  SOLN
100.0000 mL | Freq: Once | INTRAMUSCULAR | Status: AC | PRN
  Administered 2020-07-14: 100 mL via INTRAVENOUS
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
## 2020-07-14 NOTE — ED Notes (Signed)
CT made aware of patient hx of tubal ligation 10 years ago. Dr. Vicente Males notified as well. CT to come get patient asap.

## 2020-07-14 NOTE — ED Provider Notes (Signed)
[facility] Emergency Department Provider Note   ____________________________________________   Event Date/Time   First MD Initiated Contact with Patient 07/14/20 0015     (approximate)  I have reviewed the triage vital signs and the nursing notes.   HISTORY  Chief Complaint Leg Pain (Pt arrives to ED from home c/o L leg pain & swelling x 2 days & pain on R buttocks, w/difficulty ambulating. Denies fever. Hx of seizures, blood clots. Pt states she takes gabapentin & eliquis. )    HPI Brianna Ashley is a 35 y.o. female with the below stated past medical history presents via EMS for left lower extremity pain and perirectal pain that has been present over the last 2 days and is described as 10/10, nonradiating, has no exacerbating or relieving factors, and is associated with left lower extremity swelling.  Patient states that she is currently taking Eliquis for history of blood clots and has missed a few doses because she "lost the medicine".  Patient currently denies any vision changes, tinnitus, difficulty speaking, facial droop, sore throat, chest pain, shortness of breath, abdominal pain, nausea/vomiting/diarrhea, dysuria, or weakness/numbness/paresthesias in any extremity         Past Medical History:  Diagnosis Date  . Asthma   . Carpal tunnel syndrome   . DVT of lower extremity (deep venous thrombosis) (HCC)   . Factor 5 Leiden mutation, heterozygous (HCC)   . Migraine   . Seizures [facility])     Patient Active Problem List   Diagnosis Date Noted  . Factor V Leiden (HCC) 03/28/2016  . Acute deep vein thrombosis (DVT) of femoral vein of left lower extremity (HCC) 03/28/2016  . Lupus anticoagulant positive 03/28/2016  . DVT (deep venous thrombosis) (HCC) 02/02/2016    Past Surgical History:  Procedure Laterality Date  . ABSCESS DRAINAGE    . CESAREAN SECTION    . DILATION AND CURETTAGE OF UTERUS    . TUBAL LIGATION  "2010"    Prior to  Admission medications   Medication Sig Start Date End Date Taking? Authorizing Provider  amoxicillin-clavulanate (AUGMENTIN) 875-125 MG tablet Take 1 tablet by mouth 2 (two) times daily for 7 days. 07/14/20 07/21/20 Yes Alijah Hyde, Clent Jacks, MD  ketorolac (TORADOL) 10 MG tablet Take 1 tablet (10 mg total) by mouth every 6 (six) hours as needed. 07/14/20  Yes Merwyn Katos, MD  albuterol (PROVENTIL HFA;VENTOLIN HFA) 108 (90 Base) MCG/ACT inhaler Inhale 2 puffs every 6 (six) hours as needed into the lungs for wheezing or shortness of breath. 02/03/17   Menshew, Charlesetta Ivory, PA-C  albuterol (PROVENTIL HFA;VENTOLIN HFA) 108 (90 Base) MCG/ACT inhaler Inhale 2 puffs into the lungs every 6 (six) hours as needed for wheezing or shortness of breath. 06/23/17   Emily Filbert, MD  APIXABAN Everlene Balls) VTE STARTER PACK (10MG  AND 5MG ) Take as directed on package: start with two-5mg  tablets twice daily for 7 days. On day 8, switch to one-5mg  tablet twice daily. 01/04/20   , PA  benzonatate (TESSALON PERLES) 100 MG capsule Take 1-2 tabs TID prn cough 02/03/17   Menshew, Lucy Chris, PA-C  citalopram (CELEXA) 40 MG tablet Take 1 tablet (40 mg total) by mouth daily. 02/04/16   Gouru, Charlesetta Ivory, MD  clonazePAM (KLONOPIN) 1 MG tablet Take 1 tablet (1 mg total) by mouth 3 (three) times daily. 02/03/16   Deanna Artis, MD  docusate sodium (COLACE) 100 MG capsule Take 1 capsule (100 mg total) by  mouth 2 (two) times daily as needed for mild constipation. 02/03/16   Gouru, Deanna Artis, MD  fluticasone (FLONASE) 50 MCG/ACT nasal spray Place 2 sprays daily into both nostrils. 02/03/17   Menshew, Charlesetta Ivory, PA-C  gabapentin (NEURONTIN) 100 MG capsule Take 1 capsule (100 mg total) by mouth 3 (three) times daily. 06/23/17 06/23/18  Emily Filbert, MD  methocarbamol (ROBAXIN) 500 MG tablet Take 1 tablet (500 mg total) by mouth every 6 (six) hours as needed for muscle spasms. 03/26/20   Triplett, Rulon Eisenmenger B, FNP   metroNIDAZOLE (FLAGYL) 500 MG tablet Take 1 tablet (500 mg total) by mouth 2 (two) times daily. 10/14/16   Cuthriell, Delorise Royals, PA-C  ondansetron (ZOFRAN ODT) 4 MG disintegrating tablet Take 1 tablet (4 mg total) by mouth every 8 (eight) hours as needed for nausea or vomiting. 06/23/17   Emily Filbert, MD  pantoprazole (PROTONIX) 40 MG tablet Take 1 tablet (40 mg total) by mouth daily. 06/23/17 06/23/18  Emily Filbert, MD  penicillin v potassium (VEETID) 250 MG tablet Take 1 tablet (250 mg total) by mouth 4 (four) times daily. 03/02/18   Jene Every, MD  predniSONE (STERAPRED UNI-PAK 21 TAB) 10 MG (21) TBPK tablet 6-day taper as directed. 02/03/17   Menshew, Charlesetta Ivory, PA-C  QUEtiapine (SEROQUEL) 25 MG tablet Take 1 tablet (25 mg total) by mouth 2 (two) times daily. 02/03/16   Ramonita Lab, MD    Allergies Ambien [zolpidem tartrate], Ambien [zolpidem tartrate], Clindamycin/lincomycin, Keflex [cephalexin], Methylphenidate, Ritalin [methylphenidate hcl], Ritalin [methylphenidate hcl], and Zolpidem  Family History  Problem Relation Age of Onset  . Factor V Leiden deficiency Mother        deceased of drug overdose    Social History Social History   Tobacco Use  . Smoking status: Current Every Day Smoker    Packs/day: 1.00    Years: 20.00    Pack years: 20.00    Types: Cigarettes  . Smokeless tobacco: Never Used  Substance Use Topics  . Alcohol use: No  . Drug use: No    Comment: last use New Year's eve.    Review of Systems Constitutional: No fever/chills Eyes: No visual changes. ENT: No sore throat. Cardiovascular: Denies chest pain. Respiratory: Denies shortness of breath. Gastrointestinal: No abdominal pain.  No nausea, no vomiting.  No diarrhea. Genitourinary: Negative for dysuria.  Endorses perirectal pain Musculoskeletal: Positive for left lower extremity pain and swelling Skin: Negative for rash. Neurological: Negative for headaches,  weakness/numbness/paresthesias in any extremity Psychiatric: Negative for suicidal ideation/homicidal ideation   ____________________________________________   PHYSICAL EXAM:  VITAL SIGNS: ED Triage Vitals  Enc Vitals Group     BP      Pulse      Resp      Temp      Temp src      SpO2      Weight      Height      Head Circumference      Peak Flow      Pain Score      Pain Loc      Pain Edu?      Excl. in GC?    Constitutional: Alert and oriented. Well appearing obese Caucasian middle-aged female in mild distress secondary to pain Eyes: Conjunctivae are normal. PERRL. Head: Atraumatic. Nose: No congestion/rhinnorhea. Mouth/Throat: Mucous membranes are moist. Neck: No stridor Cardiovascular: Grossly normal heart sounds.  Good peripheral circulation. Respiratory: Normal respiratory effort.  No retractions.  Gastrointestinal: Soft and nontender. No distention. Rectal: Erythema and tenderness to palpation at the 1 o'clock position of the perirectal area Musculoskeletal: No obvious deformities Neurologic:  Normal speech and language. No gross focal neurologic deficits are appreciated. Skin:  Skin is warm and dry. No rash noted.  No appreciable edema noted between left and right leg Psychiatric: Mood and affect are normal. Speech and behavior are normal.  ____________________________________________   LABS (all labs ordered are listed, but only abnormal results are displayed)  Labs Reviewed  COMPREHENSIVE METABOLIC PANEL - Abnormal; Notable for the following components:      Result Value   Glucose, Bld 100 (*)    All other components within normal limits  CBC WITH DIFFERENTIAL/PLATELET - Abnormal; Notable for the following components:   WBC 13.5 (*)    Neutro Abs 8.4 (*)    All other components within normal limits   RADIOLOGY  ED MD interpretation: CT of the pelvis with IV contrast shows a 3 cm posterior slightly right of midline anorectal abscess with mild  surrounding cellulitis  Doppler ultrasound of the left lower extremity shows no evidence of acute abnormalities including no acute DVTs  Official radiology report(s): CT PELVIS W CONTRAST  Result Date: 07/14/2020 CLINICAL DATA:  Abscess, anal or rectal. EXAM: CT PELVIS WITH CONTRAST TECHNIQUE: Multidetector CT imaging of the pelvis was performed using the standard protocol following the bolus administration of intravenous contrast. CONTRAST:  OMNIPAQUE IOHEXOL 300 MG/ML  SOLN COMPARISON:  CT pelvis 04/23/2019 FINDINGS: Urinary Tract:  No abnormality visualized. Bowel: Unremarkable visualized pelvic bowel loops. The appendix is normal. Vascular/Lymphatic: No pathologically enlarged lymph nodes. No significant vascular abnormality seen. Reproductive:  No mass or other significant abnormality Other: No intraperitoneal free fluid. No intraperitoneal free gas. There is a 2.7 x 2.1 x 2 cm fluid collection posterior to the anorectal junction (2:46, 5:74) that is slightly to the right of midline. Associated peripheral enhancement. In its largest dimension, the collection measures up to 4 cm. Musculoskeletal: No suspicious bone lesions identified. Mild medial right gluteal subcutaneus soft tissue edema in trace dermal thickening. No emphysema. IMPRESSION: An at least 3 cm posterior, slightly to the right of midline, anorectal abscess. Associated mild cellulitis of the right gluteal crease. Electronically Signed   By: Tish Frederickson M.D.   On: 07/14/2020 03:18   US Venous Img Lower Unilateral Left  Result Date: 07/14/2020 CLINICAL DATA:  Left lower extremity swelling and pain. History of DVT. EXAM: LEFT LOWER EXTREMITY VENOUS DOPPLER ULTRASOUND TECHNIQUE: Gray-scale sonography with compression, as well as color and duplex ultrasound, were performed to evaluate the deep venous system(s) from the level of the common femoral vein through the popliteal and proximal calf veins. COMPARISON:  None. FINDINGS: VENOUS  Normal compressibility of the common femoral, superficial femoral, and popliteal veins, as well as the visualized calf veins. Visualized portions of profunda femoral vein and great saphenous vein unremarkable. No filling defects to suggest DVT on grayscale or color Doppler imaging. Doppler waveforms show normal direction of venous flow, normal respiratory plasticity and response to augmentation. Limited views of the contralateral common femoral vein are unremarkable. OTHER None. Limitations: none IMPRESSION: Negative. Electronically Signed   By: Deatra Robinson M.D.   On: 07/14/2020 01:10    ____________________________________________   PROCEDURES  Procedure(s) performed (including Critical Care):  Procedures   ____________________________________________   INITIAL IMPRESSION / ASSESSMENT AND PLAN / ED COURSE  As part of my medical decision making, I reviewed the following  data within the electronic MEDICAL RECORD NUMBER Nursing notes reviewed and incorporated, Labs reviewed, EKG interpreted, Old chart reviewed, Radiograph reviewed and Notes from prior ED visits reviewed and incorporated        Patient is a 35 year old female who presents for left lower extremity and perirectal pain.  Differential diagnosis includes but is not limited to: DVT, perirectal abscess, anal rectal abscess, external hemorrhoids  Laboratory evaluation significant for: WBC 13  Radiologic evaluation significant for: 3 cm anorectal abscess  Consult: General surgery-Dr. Aleen CampiPiscoya recommended follow-up in his clinic in 2 days and p.o. antibiotics if patient's pain can be controlled adequately.  The patient has been reexamined and is ready to be discharged.  All diagnostic results have been reviewed and discussed with the patient/family.  Care plan has been outlined and the patient/family understands all current diagnoses, results, and treatment plans.  There are no new complaints, changes, or physical findings at this  time.  All questions have been addressed and answered.  [All medications, if any, that were given while in the emergency department or any that are being prescribed have been reviewed with the patient/family.  All side effects and adverse reactions have been explained.]  Patient was instructed to, and agrees to follow-up with their primary care physician as well as return to the emergency department if any new or worsening symptoms develop.  Dispo: Discharge home with general surgery follow-up in 2 days     ____________________________________________   FINAL CLINICAL IMPRESSION(S) / ED DIAGNOSES  Final diagnoses:  Anorectal abscess     ED Discharge Orders         Ordered    amoxicillin-clavulanate (AUGMENTIN) 875-125 MG tablet  2 times daily        07/14/20 0540    ketorolac (TORADOL) 10 MG tablet  Every 6 hours PRN       Note to Pharmacy: Patient given an IM/IV loading dose in emergency department   07/14/20 0540           Note:  This document was prepared using Dragon voice recognition software and may include unintentional dictation errors.   Merwyn KatosBradler, Leticia Coletta K, MD 07/14/20 [phone]

## 2020-07-14 NOTE — ED Notes (Signed)
Patient notified that a urine sample was ordered by provider for urine preg. Patient reports "It ain't gone be accurate, they never work for me." Patient did verbalize understanding of still pending order.

## 2020-07-14 NOTE — Progress Notes (Signed)
PHARMACY -  BRIEF ANTIBIOTIC NOTE   Pharmacy has received consult(s) for Aztreonam and Vancomycin from an ED provider.  The patient's profile has been reviewed for ht/wt/allergies/indication/available labs.    One time order(s) placed for Aztreonam 2 gm and Vancocin 2 gm per pt wt:  110 kg and possible cross sensitivity with Vancoready per pt allergies to Ambien and Ritalin causing "Death."  Further antibiotics/pharmacy consults should be ordered by admitting physician if indicated.                       Otelia Sergeant, PharmD, [facility] 07/14/2020 4:10 AM

## 2020-07-14 NOTE — ED Notes (Signed)
Patient transported to CT 

## 2020-07-14 NOTE — ED Notes (Signed)
Patient asking for water. Patient aware of pending CT scan prior to water/food. Patient verbalized understanding.

## 2020-07-14 NOTE — Progress Notes (Signed)
CODE SEPSIS - PHARMACY COMMUNICATION  **Broad Spectrum Antibiotics should be administered within 1 hour of Sepsis diagnosis**  Time Code Sepsis Called/Page Received: 8381    Antibiotics Ordered: Aztreonam and Vancomycin   Time of 1st antibiotic administration: 0416  Otelia Sergeant, PharmD, [facility] 07/14/2020 3:58 AM

## 2020-07-14 NOTE — ED Notes (Signed)
Entered room to find pt lying on R lateral side-- flat affect with frequent questioning.  Pt initially to have blood cultures and covid/flu swab however prior to sticking patient for blood cultures Dr Vicente Males back to bedside to discuss options for pt to have surgery as inpt 07/15/20 or outpt in general surgery office 07/16/20.  After long discussion pt has opted for outpt tx.  Dr Vicente Males has now cleared pt to eat - pt provided sandwich meal and cola to drink --pt also received an extra blanket.  Due to hypotension no new pain meds ordered- pt educated that when bp improves from additional bolus provider will order additional pain med; pt acknowledges verbal understanding.

## 2020-07-14 NOTE — ED Notes (Signed)
Pt aroused from resting state to review d/c instructions prior to Morphine administration - per Dr Vicente Males pt to be monitored after Morphine administration and ok to d/c home via bus.  Pt now oob to in room commode- ambulating independently with steady gait; no acute changes - VSS

## 2020-07-16 ENCOUNTER — Telehealth: Payer: Self-pay

## 2020-07-16 ENCOUNTER — Encounter: Payer: Self-pay | Admitting: Surgery

## 2020-07-16 ENCOUNTER — Other Ambulatory Visit: Payer: Self-pay

## 2020-07-16 ENCOUNTER — Ambulatory Visit (INDEPENDENT_AMBULATORY_CARE_PROVIDER_SITE_OTHER): Payer: Self-pay | Admitting: Surgery

## 2020-07-16 VITALS — BP 95/61 | HR 75 | Temp 98.9°F | Wt 229.0 lb

## 2020-07-16 DIAGNOSIS — K610 Anal abscess: Secondary | ICD-10-CM

## 2020-07-16 DIAGNOSIS — K611 Rectal abscess: Secondary | ICD-10-CM

## 2020-07-16 MED ORDER — OXYCODONE HCL 5 MG PO TABS
5.0000 mg | ORAL_TABLET | ORAL | 0 refills | Status: DC | PRN
Start: 2020-07-16 — End: 2021-09-22

## 2020-07-16 MED ORDER — IBUPROFEN 800 MG PO TABS: 800.0000 mg | ORAL_TABLET | Freq: Three times a day (TID) | ORAL | 1 refills | Status: DC | PRN

## 2020-07-16 NOTE — Telephone Encounter (Signed)
Message left for the patient to call the office back. Dr Aleen Campi would like to see her in office late this morning for an ED follow up for a perianal abscess.

## 2020-07-16 NOTE — Patient Instructions (Addendum)
You will need to change your packing once a day. You may change the top dressing more often as needed.  You will have a lot of drainage the first 2-3 days. Please pick up your medication at the pharmacy today.

## 2020-07-17 ENCOUNTER — Encounter: Payer: Self-pay | Admitting: Surgery

## 2020-07-17 NOTE — Progress Notes (Signed)
07/16/2020  Reason for Visit:  Perianal abscess  History of Present Illness: Brianna Ashley is a 35 y.o. female presenting for evaluation of a perianal abscess.  The patient has prior history of perianal abscess, once that required formal I&D x 2 in the OR in 2015.  She also has had one episode last year that was treated with antibiotics alone.  She presented to the ED on 4/23 with the same.  She reports that she started having pain and swelling of the right buttocks for two days prior to presenting to ED.  She has history of Factor V Leiden and is on Eliquis.  CT scan obtained showed a 2.7 x 2.1 x 2 cm abscess.  We offered admission to hospital for IV abx, hold Eliquis, and take to OR afterwards, but she preferred to go home instead.  She was given a prescription for antibiotics, but she did not fill the prescription because she did not have money.  She presents today, now two days after her ER visit, with worsening pain, worsening swelling of the abscess.  No spontaneous drainage, fevers, or chills.  Reports pain when she has a bowel movement, when she wipes because of the inflammation/swelling.  Past Medical History: Past Medical History:  Diagnosis Date  . Asthma   . Carpal tunnel syndrome   . DVT of lower extremity (deep venous thrombosis) (HCC)   . Factor 5 Leiden mutation, heterozygous (HCC)   . Migraine   . Seizures (HCC)      Past Surgical History: Past Surgical History:  Procedure Laterality Date  . ABSCESS DRAINAGE    . CESAREAN SECTION    . DILATION AND CURETTAGE OF UTERUS    . TUBAL LIGATION  "2010"    Home Medications: Prior to Admission medications   Medication Sig Start Date End Date Taking? Authorizing Provider  albuterol (PROVENTIL HFA;VENTOLIN HFA) 108 (90 Base) MCG/ACT inhaler Inhale 2 puffs every 6 (six) hours as needed into the lungs for wheezing or shortness of breath. 02/03/17  Yes Menshew, Charlesetta Ivory, PA-C  APIXABAN (ELIQUIS) VTE STARTER PACK (10MG  AND  5MG ) Take as directed on package: start with two-5mg  tablets twice daily for 7 days. On day 8, switch to one-5mg  tablet twice daily. 01/04/20  Yes , PA  cyclobenzaprine (FLEXERIL) 10 MG tablet Take 1 tablet by mouth at bedtime as needed for back pain 06/05/20  Yes [provider]  docusate sodium (COLACE) 100 MG capsule Take 1 capsule (100 mg total) by mouth 2 (two) times daily as needed for mild constipation. 02/03/16  Yes Gouru, 06/07/20, MD  ibuprofen (ADVIL) 800 MG tablet Take 1 tablet (800 mg total) by mouth every 8 (eight) hours as needed for moderate pain. 07/16/20  Yes Phoenyx Paulsen, Deanna Artis, MD  oxyCODONE (ROXICODONE) 5 MG immediate release tablet Take 1 tablet (5 mg total) by mouth every 4 (four) hours as needed for severe pain. 07/16/20  Yes Jamillia Closson, Elita Quick, MD  amoxicillin-clavulanate (AUGMENTIN) 875-125 MG tablet Take 1 tablet by mouth 2 (two) times daily for 7 days. Patient not taking: Reported on 07/16/2020 07/14/20 07/21/20  07/16/20, MD  clonazePAM (KLONOPIN) 1 MG tablet Take 1 tablet (1 mg total) by mouth 3 (three) times daily. Patient not taking: Reported on 07/16/2020 02/03/16   07/18/2020, MD  gabapentin (NEURONTIN) 100 MG capsule Take 1 capsule (100 mg total) by mouth 3 (three) times daily. 06/23/17 06/23/18  08/23/17, MD  ketorolac (TORADOL) 10 MG tablet  Take 1 tablet (10 mg total) by mouth every 6 (six) hours as needed. Patient not taking: Reported on 07/16/2020 07/14/20   Merwyn Katos, MD    Allergies: Allergies  Allergen Reactions  . Ambien [Zolpidem Tartrate] Other (See Comments)    death  . Ambien [Zolpidem Tartrate]   . Clindamycin/Lincomycin Nausea And Vomiting  . Keflex [Cephalexin] Nausea And Vomiting  . Methylphenidate Other (See Comments)    "I pretty much died in my dad's arms"  . Ritalin [Methylphenidate Hcl] Other (See Comments)    death  . Ritalin [Methylphenidate Hcl]   . Zolpidem Other (See Comments)    "I pretty much died a  few times"    Social History:  reports that she has been smoking cigarettes. She has a 20.00 pack-year smoking history. She has never used smokeless tobacco. She reports that she does not drink alcohol and does not use drugs.   Family History: Family History  Problem Relation Age of Onset  . Factor V Leiden deficiency Mother        deceased of drug overdose    Review of Systems: Review of Systems  Constitutional: Negative for chills and fever.  HENT: Negative for hearing loss.   Respiratory: Negative for shortness of breath.   Cardiovascular: Negative for chest pain.  Gastrointestinal: Negative for abdominal pain, nausea and vomiting.  Genitourinary: Negative for dysuria.  Musculoskeletal: Negative for myalgias.  Skin:       Perianal abscess  Neurological: Negative for dizziness.  Psychiatric/Behavioral: Negative for depression.    Physical Exam BP 95/61   Pulse 75   Temp 98.9 F (37.2 C)   Wt 229 lb (103.9 kg)   LMP 07/13/2020 (Exact Date) Comment: Patient reports tubal ligation  SpO2 93%   BMI 38.11 kg/m  CONSTITUTIONAL: No acute distress HEENT:  Normocephalic, atraumatic, extraocular motion intact. NECK: Trachea is midline, and there is no jugular venous distension.  RESPIRATORY:  Normal respiratory effort without pathologic use of accessory muscles. CARDIOVASCULAR: Heart is regular without murmurs, gallops, or rubs. GI: The abdomen is soft, non-distended, obese, non-tender.  RECTAL:  External exam reveals a 5 x 2.5 cm abscess in the right perianal area.  There is no drainage, but it is very tender to palpation.  There's surrounding induration, particularly lateral.   MUSCULOSKELETAL:  Normal muscle strength and tone in all four extremities.  No peripheral edema or cyanosis. SKIN: Skin turgor is normal. There are no pathologic skin lesions.  NEUROLOGIC:  Motor and sensation is grossly normal.  Cranial nerves are grossly intact. PSYCH:  Alert and oriented to person,  place and time. Affect is normal.  Laboratory Analysis: WBC 13.5, Hgb 12.1, Hct 36.9, Plt 324.  Na 136, K 4.1, Cl 106, CO2 22, BUN 14, Cr 0.88.  LFTs within normal.  Imaging: CT pelvis 07/14/20: IMPRESSION: An at least 3 cm posterior, slightly to the right of midline, anorectal abscess. Associated mild cellulitis of the right gluteal crease.   Assessment and Plan: This is a 36 y.o. female with worsening right perianal abscess.  --Discussed with the patient that she needs I&D procedure.  She also needs to fill her antibiotics so this can continue to get treated.  Have given her a GoodRx card so she can find the best price for her antibiotic.  Reviewed with her the risks of the I&D procedure, including bleeding, injury to surrounding structures, need for further procedures, and she's willing to proceed.   Procedure Date:  07/16/2020  Pre-operative  Diagnosis:  Right perianal abscess  Post-operative Diagnosis:  Right perianal abscess  Procedure:  Incision and Drainage of right perianal abscess  Surgeon:  Howie Ill, MD  Anesthesia:  15 ml of 1% lidocaine with epi and bicarb.  Estimated Blood Loss:  5 ml  Specimens:  Culture swab  Complications:  None  Indications for Procedure:  This is a 35 y.o. female with diagnosis of right perianal abscess, requiring drainage procedure.  The risks of bleeding, abscess or infection, injury to surrounding structures, and need for further procedures were all discussed with the patient and was willing to proceed.  Description of Procedure: The patient was correctly identified at bedside.  Appropriate time-outs were performed prior to procedure.  The patient's right perianal area was prepped and draped in usual sterile fashion.  Local anesthetic was infused intradermally.  A cruciate 1.5 cm incision was made over the abscess, revealing large amount of purulent fluid.  This fluid was swabbed for culture and sent to micro.  Small Kelly forceps  were used to dissect around the abscess tissue to open any remaining pockets of purulent fluid.  After drainage was completed, the cavity was irrigated and cleaned.  The wound was packed with 1/2 inch gauze and covered with dry gauze and tape.  The patient tolerated the procedure well and all sharps were appropriately disposed of at the end of the case.  --Urged patient to get her antibiotic prescription filled so this can help treat the abscess. --Instructed on how to do daily packing dressing changes --Will give prescription for Ibuprofen and Oxycodone. --Follow up in one week.  Face-to-face time spent with the patient and care providers was 45 minutes, with more than 50% of the time spent counseling, educating, and coordinating care of the patient.     Howie Ill, MD [facility]

## 2020-07-18 ENCOUNTER — Ambulatory Visit: Payer: Self-pay | Admitting: Surgery

## 2020-07-20 ENCOUNTER — Telehealth: Payer: Self-pay | Admitting: Surgery

## 2020-07-20 LAB — ANAEROBIC AND AEROBIC CULTURE

## 2020-07-20 NOTE — Telephone Encounter (Signed)
Spoke with the patient and the wound has closed some and is more narrow. Instructed to use 1/4 inch packing instead and may change her top dressing as needed. She will come in Monday morning to be seen by Ian Malkin as she does not have transportation until then.

## 2020-07-20 NOTE — Telephone Encounter (Signed)
Patient is calling and has some questions about her wound being packed she said her boyfriend isn't able to pack it anymore and said the oxycodone. Please call patient and advise.

## 2020-07-23 ENCOUNTER — Encounter: Payer: Self-pay | Admitting: Physician Assistant

## 2020-07-23 ENCOUNTER — Encounter: Payer: Self-pay | Admitting: Surgery

## 2020-07-25 ENCOUNTER — Other Ambulatory Visit: Payer: Self-pay

## 2020-07-25 ENCOUNTER — Ambulatory Visit (INDEPENDENT_AMBULATORY_CARE_PROVIDER_SITE_OTHER): Payer: Self-pay | Admitting: Surgery

## 2020-07-25 ENCOUNTER — Encounter: Payer: Self-pay | Admitting: Surgery

## 2020-07-25 VITALS — BP 119/74 | HR 79 | Temp 98.3°F | Ht 65.0 in | Wt 234.4 lb

## 2020-07-25 DIAGNOSIS — Z09 Encounter for follow-up examination after completed treatment for conditions other than malignant neoplasm: Secondary | ICD-10-CM

## 2020-07-25 DIAGNOSIS — K610 Anal abscess: Secondary | ICD-10-CM

## 2020-07-25 NOTE — Patient Instructions (Addendum)
You can take a shower and put a dry gauze over the affected area. Keep area clean from germs.   If you have any concerns or questions, please feel free to call our office. See follow up appointment below.     Incision and Drainage, Care After This sheet gives you information about how to care for yourself after your procedure. Your health care provider may also give you more specific instructions. If you have problems or questions, contact your health care provider. What can I expect after the procedure? After the procedure, it is common to have:  Pain or discomfort around the incision site.  Blood, fluid, or pus (drainage) from the incision.  Redness and firm skin around the incision site. Follow these instructions at home: Medicines  Take over-the-counter and prescription medicines only as told by your health care provider.  If you were prescribed an antibiotic medicine, use or take it as told by your health care provider. Do not stop using the antibiotic even if you start to feel better. Wound care Follow instructions from your health care provider about how to take care of your wound. Make sure you:  Wash your hands with soap and water before and after you change your bandage (dressing). If soap and water are not available, use hand sanitizer.  Change your dressing and packing as told by your health care provider. ? If your dressing is dry or stuck when you try to remove it, moisten or wet the dressing with saline or water so that it can be removed without harming your skin or tissues. ? If your wound is packed, leave it in place until your health care provider tells you to remove it. To remove the packing, moisten or wet the packing with saline or water so that it can be removed without harming your skin or tissues.  Leave stitches (sutures), skin glue, or adhesive strips in place. These skin closures may need to stay in place for 2 weeks or longer. If adhesive strip edges start  to loosen and curl up, you may trim the loose edges. Do not remove adhesive strips completely unless your health care provider tells you to do that. Check your wound every day for signs of infection. Check for:  More redness, swelling, or pain.  More fluid or blood.  Warmth.  Pus or a bad smell. If you were sent home with a drain tube in place, follow instructions from your health care provider about:  How to empty it.  How to care for it at home.   General instructions  Rest the affected area.  Do not take baths, swim, or use a hot tub until your health care provider approves. Ask your health care provider if you may take showers. You may only be allowed to take sponge baths.  Return to your normal activities as told by your health care provider. Ask your health care provider what activities are safe for you. Your health care provider may put you on activity or lifting restrictions.  The incision will continue to drain. It is normal to have some clear or slightly bloody drainage. The amount of drainage should lessen each day.  Do not apply any creams, ointments, or liquids unless you have been told to by your health care provider.  Keep all follow-up visits as told by your health care provider. This is important. Contact a health care provider if:  Your cyst or abscess returns.  You have a fever or chills.  You  have more redness, swelling, or pain around your incision.  You have more fluid or blood coming from your incision.  Your incision feels warm to the touch.  You have pus or a bad smell coming from your incision.  You have red streaks above or below the incision site. Get help right away if:  You have severe pain or bleeding.  You cannot eat or drink without vomiting.  You have decreased urine output.  You become short of breath.  You have chest pain.  You cough up blood.  The affected area becomes numb or starts to tingle. These symptoms may represent  a serious problem that is an emergency. Do not wait to see if the symptoms will go away. Get medical help right away. Call your local emergency services (911 in the [place] Do not drive yourself to the hospital. Summary  After this procedure, it is common to have fluid, blood, or pus coming from the surgery site.  Follow all home care instructions. You will be told how to take care of your incision, how to check for infection, and how to take medicines.  If you were prescribed an antibiotic medicine, take it as told by your health care provider. Do not stop taking the antibiotic even if you start to feel better.  Contact a health care provider if you have increased redness, swelling, or pain around your incision. Get help right away if you have chest pain, you vomit, you cough up blood, or you have shortness of breath.  Keep all follow-up visits as told by your health care provider. This is important. This information is not intended to replace advice given to you by your health care provider. Make sure you discuss any questions you have with your health care provider. Document Revised: 02/08/2018 Document Reviewed: 02/08/2018 Elsevier Patient Education  2021 [employer].

## 2020-07-25 NOTE — Progress Notes (Signed)
07/25/2020  HPI: Brianna Ashley is a 35 y.o. female s/p I&D of perianal abscess on 4/25.  Presents for follow up.  Reports still having pain in the perianal area.  Has been doing dressing changes, and incision is small that 1/2 inch gauze does not fit well.    Vital signs: BP 119/74   Pulse 79   Temp 98.3 F (36.8 C) (Oral)   Ht 5\' 5"  (1.651 m)   Wt 234 lb 6.4 oz (106.3 kg)   LMP 07/13/2020 (Exact Date) Comment: Patient reports tubal ligation  SpO2 98%   BMI 39.01 kg/m    Physical Exam: Constitutional:  No acute distress Rectal:  External exam reveals much improved I&D site.  No further erythema, induration, or firmness.  The wound is small, with packing currently in place.  No purulence noted.  Assessment/Plan: This is a 35 y.o. female s/p I&D of perianal abscess  --Discussed with the patient that the pain that she may be having a could be residual inflammation from this infection and the procedure.  This should continue to improve.  The wound appears healthy with no further erythema or induration.  Instructed to continue packing dressing changes daily and now taken use 1/4 inch gauze instead. - Follow-up in 3 weeks to assess healing.   33, MD [facility]

## 2020-08-17 ENCOUNTER — Ambulatory Visit: Payer: Self-pay | Admitting: Surgery

## 2021-06-11 NOTE — Congregational Nurse Program (Signed)
?  Dept: [phone] ? ? ?Congregational Nurse Program Note ? ?Date of Encounter: 06/11/2021 ?Client to clinic today with complaints of nasal congestion/allergies. Chest rub applied. ?Client also ready for Rn to assist with [employer] application. Application completed and will be dropped off today. Client aware and in agreement. Emotional support given. ?Past Medical History: ?Past Medical History:  ?Diagnosis Date  ? Asthma   ? Carpal tunnel syndrome   ? DVT of lower extremity (deep venous thrombosis) (HCC)   ? Factor 5 Leiden mutation, heterozygous (HCC)   ? Migraine   ? Seizures (HCC)   ? ? ?Encounter Details: ? CNP Questionnaire - 06/11/21 1141   ? ?  ? Questionnaire  ? Do you give verbal consent to treat you today? Yes   ? Location Patient Served  [facility]   ? Visit Setting Church or Organization   ? Patient Status Homeless   ? Insurance Uninsured ([employer]/Self-Pay)   ? Insurance Referral N/A   ? Medication Have Medication Insecurities   ? Medical Provider No   application filled out for [employer]  ? Screening Referrals N/A   ? Medical Referral [facility] PCP/[facility]   [facility]  ? Medical Appointment Made N/A   ? Food Have Food Insecurities   ? Transportation N/A   client uses the [place] bus system  ? Housing/Utilities N/A   ? Interpersonal Safety N/A   ? Intervention Case Management;Support   ? ED Visit Averted N/A   ? Life-Saving Intervention Made N/A   ? ?  ?  ? ?  ? ? ? ? ?

## 2021-06-12 ENCOUNTER — Ambulatory Visit: Payer: Self-pay

## 2021-08-20 ENCOUNTER — Ambulatory Visit: Payer: Self-pay | Admitting: Gerontology

## 2021-09-18 ENCOUNTER — Other Ambulatory Visit: Payer: Self-pay

## 2021-09-18 ENCOUNTER — Encounter
Admission: EM | Disposition: A | Discharge status: Home Health | Payer: Self-pay | Source: Home / Self Care | Attending: Surgery

## 2021-09-18 ENCOUNTER — Observation Stay: Payer: Medicaid Other | Admitting: Anesthesiology

## 2021-09-18 ENCOUNTER — Inpatient Hospital Stay
Admission: EM | Admit: 2021-09-18 | Discharge: 2021-09-22 | DRG: 348 | Disposition: A | Discharge status: Home Health | Payer: Medicaid Other | Attending: Surgery | Admitting: Surgery

## 2021-09-18 ENCOUNTER — Encounter: Payer: Self-pay | Admitting: Surgery

## 2021-09-18 ENCOUNTER — Emergency Department: Payer: Medicaid Other

## 2021-09-18 DIAGNOSIS — K612 Anorectal abscess: Principal | ICD-10-CM | POA: Diagnosis present

## 2021-09-18 DIAGNOSIS — K610 Anal abscess: Secondary | ICD-10-CM

## 2021-09-18 DIAGNOSIS — Z86718 Personal history of other venous thrombosis and embolism: Secondary | ICD-10-CM

## 2021-09-18 DIAGNOSIS — K611 Rectal abscess: Secondary | ICD-10-CM | POA: Diagnosis present

## 2021-09-18 DIAGNOSIS — F17210 Nicotine dependence, cigarettes, uncomplicated: Secondary | ICD-10-CM | POA: Diagnosis present

## 2021-09-18 DIAGNOSIS — Z888 Allergy status to other drugs, medicaments and biological substances status: Secondary | ICD-10-CM

## 2021-09-18 DIAGNOSIS — Z881 Allergy status to other antibiotic agents status: Secondary | ICD-10-CM

## 2021-09-18 DIAGNOSIS — D72829 Elevated white blood cell count, unspecified: Secondary | ICD-10-CM | POA: Diagnosis present

## 2021-09-18 DIAGNOSIS — Z5900 Homelessness unspecified: Secondary | ICD-10-CM

## 2021-09-18 DIAGNOSIS — D6851 Activated protein C resistance: Secondary | ICD-10-CM | POA: Diagnosis present

## 2021-09-18 HISTORY — PX: INCISION AND DRAINAGE PERIRECTAL ABSCESS: SHX1804

## 2021-09-18 LAB — CBC WITH DIFFERENTIAL/PLATELET
Abs Immature Granulocytes: 0.03 10*3/uL (ref 0.00–0.07)
Basophils Absolute: 0.1 10*3/uL (ref 0.0–0.1)
Basophils Relative: 1 %
Eosinophils Absolute: 0.3 10*3/uL (ref 0.0–0.5)
Eosinophils Relative: 3 %
HCT: 39 % (ref 36.0–46.0)
Hemoglobin: 13 g/dL (ref 12.0–15.0)
Immature Granulocytes: 0 %
Lymphocytes Relative: 23 %
Lymphs Abs: 2.4 10*3/uL (ref 0.7–4.0)
MCH: 29.1 pg (ref 26.0–34.0)
MCHC: 33.3 g/dL (ref 30.0–36.0)
MCV: 87.4 fL (ref 80.0–100.0)
Monocytes Absolute: 0.8 10*3/uL (ref 0.1–1.0)
Monocytes Relative: 8 %
Neutro Abs: 7.2 10*3/uL (ref 1.7–7.7)
Neutrophils Relative %: 65 %
Platelets: 305 10*3/uL (ref 150–400)
RBC: 4.46 MIL/uL (ref 3.87–5.11)
RDW: 13.8 % (ref 11.5–15.5)
WBC: 10.8 10*3/uL — ABNORMAL HIGH (ref 4.0–10.5)
nRBC: 0 % (ref 0.0–0.2)

## 2021-09-18 LAB — COMPREHENSIVE METABOLIC PANEL
ALT: 9 U/L (ref 0–44)
AST: 12 U/L — ABNORMAL LOW (ref 15–41)
Albumin: 4.1 g/dL (ref 3.5–5.0)
Alkaline Phosphatase: 77 U/L (ref 38–126)
Anion gap: 5 (ref 5–15)
BUN: 13 mg/dL (ref 6–20)
CO2: 24 mmol/L (ref 22–32)
Calcium: 9 mg/dL (ref 8.9–10.3)
Chloride: 109 mmol/L (ref 98–111)
Creatinine, Ser: 0.94 mg/dL (ref 0.44–1.00)
GFR, Estimated: >60 mL/min (ref >60)
Glucose, Bld: 99 mg/dL (ref 70–99)
Potassium: 3.9 mmol/L (ref 3.5–5.1)
Sodium: 138 mmol/L (ref 135–145)
Total Bilirubin: 0.7 mg/dL (ref 0.3–1.2)
Total Protein: 7.4 g/dL (ref 6.5–8.1)

## 2021-09-18 LAB — LIPASE, BLOOD: Lipase: 38 U/L (ref 11–51)

## 2021-09-18 LAB — POC URINE PREG, ED: Preg Test, Ur: NEGATIVE

## 2021-09-18 SURGERY — INCISION AND DRAINAGE, ABSCESS, PERIRECTAL
Anesthesia: General | Site: Rectum | Laterality: Right

## 2021-09-18 MED ORDER — FENTANYL CITRATE (PF) 100 MCG/2ML IJ SOLN
INTRAMUSCULAR | Status: AC
  Filled 2021-09-18: qty 2

## 2021-09-18 MED ORDER — 0.9 % SODIUM CHLORIDE (POUR BTL) OPTIME
TOPICAL | Status: DC | PRN
  Administered 2021-09-18: 200 mL

## 2021-09-18 MED ORDER — MIDAZOLAM HCL 2 MG/2ML IJ SOLN
INTRAMUSCULAR | Status: AC
  Filled 2021-09-18: qty 2

## 2021-09-18 MED ORDER — ONDANSETRON HCL 4 MG/2ML IJ SOLN
4.0000 mg | Freq: Once | INTRAMUSCULAR | Status: AC | PRN
  Administered 2021-09-18: 4 mg via INTRAVENOUS

## 2021-09-18 MED ORDER — EPHEDRINE 5 MG/ML INJ
INTRAVENOUS | Status: AC
Start: 2021-09-18 — End: ?
  Filled 2021-09-18: qty 5

## 2021-09-18 MED ORDER — SODIUM CHLORIDE 0.9 % IV SOLN
3.0000 g | Freq: Four times a day (QID) | INTRAVENOUS | Status: DC
  Administered 2021-09-18 – 2021-09-22 (×14): 3 g via INTRAVENOUS
  Filled 2021-09-18 (×6): qty 8
  Filled 2021-09-18: qty 3
  Filled 2021-09-18 (×3): qty 8
  Filled 2021-09-18: qty 3
  Filled 2021-09-18 (×2): qty 8
  Filled 2021-09-18: qty 3
  Filled 2021-09-18 (×2): qty 8

## 2021-09-18 MED ORDER — LACTATED RINGERS IV SOLN: INTRAVENOUS | Status: DC | PRN

## 2021-09-18 MED ORDER — HYDROCODONE-ACETAMINOPHEN 5-325 MG PO TABS
1.0000 | ORAL_TABLET | ORAL | Status: DC | PRN
  Administered 2021-09-19 – 2021-09-21 (×13): 2 via ORAL
  Filled 2021-09-18 (×12): qty 2

## 2021-09-18 MED ORDER — BUPIVACAINE HCL 0.25 % IJ SOLN
INTRAMUSCULAR | Status: DC | PRN
  Administered 2021-09-18: 30 mL

## 2021-09-18 MED ORDER — DEXMEDETOMIDINE HCL IN NACL 200 MCG/50ML IV SOLN
INTRAVENOUS | Status: DC | PRN
  Administered 2021-09-18: 20 ug via INTRAVENOUS

## 2021-09-18 MED ORDER — MORPHINE SULFATE (PF) 4 MG/ML IV SOLN: 4.0000 mg | Freq: Once | INTRAVENOUS | Status: DC

## 2021-09-18 MED ORDER — MIDAZOLAM HCL 2 MG/2ML IJ SOLN
INTRAMUSCULAR | Status: DC | PRN
  Administered 2021-09-18: 2 mg via INTRAVENOUS

## 2021-09-18 MED ORDER — MORPHINE SULFATE (PF) 4 MG/ML IV SOLN
4.0000 mg | Freq: Once | INTRAVENOUS | Status: AC
  Administered 2021-09-18: 4 mg via INTRAVENOUS
  Filled 2021-09-18: qty 1

## 2021-09-18 MED ORDER — PROPOFOL 10 MG/ML IV BOLUS
INTRAVENOUS | Status: AC
  Filled 2021-09-18: qty 20

## 2021-09-18 MED ORDER — DEXAMETHASONE SODIUM PHOSPHATE 10 MG/ML IJ SOLN
INTRAMUSCULAR | Status: DC | PRN
  Administered 2021-09-18: 10 mg via INTRAVENOUS

## 2021-09-18 MED ORDER — ONDANSETRON HCL 4 MG/2ML IJ SOLN
INTRAMUSCULAR | Status: DC | PRN
  Administered 2021-09-18: 4 mg via INTRAVENOUS

## 2021-09-18 MED ORDER — IOHEXOL 300 MG/ML  SOLN
100.0000 mL | Freq: Once | INTRAMUSCULAR | Status: AC | PRN
  Administered 2021-09-18: 100 mL via INTRAVENOUS

## 2021-09-18 MED ORDER — DEXAMETHASONE SODIUM PHOSPHATE 10 MG/ML IJ SOLN
INTRAMUSCULAR | Status: AC
  Filled 2021-09-18: qty 1

## 2021-09-18 MED ORDER — LIDOCAINE HCL (CARDIAC) PF 100 MG/5ML IV SOSY
PREFILLED_SYRINGE | INTRAVENOUS | Status: DC | PRN
  Administered 2021-09-18: 60 mg via INTRAVENOUS

## 2021-09-18 MED ORDER — EPHEDRINE SULFATE (PRESSORS) 50 MG/ML IJ SOLN
INTRAMUSCULAR | Status: DC | PRN
  Administered 2021-09-18: 10 mg via INTRAVENOUS

## 2021-09-18 MED ORDER — PROPOFOL 10 MG/ML IV BOLUS
INTRAVENOUS | Status: DC | PRN
  Administered 2021-09-18: 200 mg via INTRAVENOUS
  Administered 2021-09-18: 100 mg via INTRAVENOUS

## 2021-09-18 MED ORDER — METRONIDAZOLE 500 MG/100ML IV SOLN: 500.0000 mg | Freq: Two times a day (BID) | INTRAVENOUS | Status: DC

## 2021-09-18 MED ORDER — ONDANSETRON HCL 4 MG/2ML IJ SOLN
INTRAMUSCULAR | Status: AC
  Filled 2021-09-18: qty 2

## 2021-09-18 MED ORDER — FENTANYL CITRATE (PF) 100 MCG/2ML IJ SOLN
25.0000 ug | INTRAMUSCULAR | Status: DC | PRN
  Administered 2021-09-18 – 2021-09-19 (×4): 25 ug via INTRAVENOUS

## 2021-09-18 MED ORDER — BUPIVACAINE-EPINEPHRINE (PF) 0.25% -1:200000 IJ SOLN
INTRAMUSCULAR | Status: AC
  Filled 2021-09-18: qty 30

## 2021-09-18 MED ORDER — FENTANYL CITRATE (PF) 100 MCG/2ML IJ SOLN
INTRAMUSCULAR | Status: DC | PRN
  Administered 2021-09-18 (×2): 50 ug via INTRAVENOUS

## 2021-09-18 MED ORDER — SEVOFLURANE IN SOLN
RESPIRATORY_TRACT | Status: AC
  Filled 2021-09-18: qty 250

## 2021-09-18 SURGICAL SUPPLY — 23 items
BLADE SURG 15 STRL LF DISP TIS (BLADE) IMPLANT
BLADE SURG 15 STRL SS (BLADE) ×2
DRAPE LAPAROTOMY 77X122 PED (DRAPES) ×1 IMPLANT
ELECT CAUTERY BLADE 6.4 (BLADE) ×1 IMPLANT
ELECT REM PT RETURN 9FT ADLT (ELECTROSURGICAL) ×2
ELECTRODE REM PT RTRN 9FT ADLT (ELECTROSURGICAL) IMPLANT
GAUZE PACKING IODOFORM 1X5 (PACKING) ×1 IMPLANT
GAUZE SPONGE 4X4 12PLY STRL (GAUZE/BANDAGES/DRESSINGS) ×1 IMPLANT
GLOVE ORTHO TXT STRL SZ7.5 (GLOVE) ×1 IMPLANT
GOWN STRL REUS W/ TWL LRG LVL3 (GOWN DISPOSABLE) IMPLANT
GOWN STRL REUS W/ TWL XL LVL3 (GOWN DISPOSABLE) IMPLANT
GOWN STRL REUS W/TWL LRG LVL3 (GOWN DISPOSABLE) ×2
GOWN STRL REUS W/TWL XL LVL3 (GOWN DISPOSABLE) ×2
KIT TURNOVER KIT A (KITS) ×1 IMPLANT
MANIFOLD NEPTUNE II (INSTRUMENTS) ×1 IMPLANT
NEEDLE HYPO 22GX1.5 SAFETY (NEEDLE) ×1 IMPLANT
NS IRRIG 1000ML POUR BTL (IV SOLUTION) ×1 IMPLANT
PACK BASIN MINOR ARMC (MISCELLANEOUS) ×1 IMPLANT
PAD ABD DERMACEA PRESS 5X9 (GAUZE/BANDAGES/DRESSINGS) ×2 IMPLANT
SOL PREP PVP 2OZ (MISCELLANEOUS) ×2
SOLUTION PREP PVP 2OZ (MISCELLANEOUS) IMPLANT
SPONGE T-LAP 18X18 ~~LOC~~+RFID (SPONGE) ×1 IMPLANT
SYR 10ML LL (SYRINGE) ×1 IMPLANT

## 2021-09-18 NOTE — Anesthesia Preprocedure Evaluation (Signed)
Anesthesia Evaluation  Patient identified by MRN, date of birth, ID band Patient awake    Reviewed: Allergy & Precautions, H&P , NPO status , Patient's Chart, lab work & pertinent test results, reviewed documented beta blocker date and time   Airway Mallampati: III  TM Distance: >3 FB Neck ROM: full    Dental  (+) Teeth Intact   Pulmonary asthma ,    Pulmonary exam normal        Cardiovascular Exercise Tolerance: Poor negative cardio ROS Normal cardiovascular exam Rhythm:regular Rate:Normal     Neuro/Psych  Headaches, Seizures -,   Neuromuscular disease negative psych ROS   GI/Hepatic negative GI ROS, Neg liver ROS,   Endo/Other  negative endocrine ROS  Renal/GU negative Renal ROS  negative genitourinary   Musculoskeletal   Abdominal   Peds  Hematology negative hematology ROS (+)   Anesthesia Other Findings Past Medical History: No date: Asthma No date: Carpal tunnel syndrome No date: DVT of lower extremity (deep venous thrombosis) (HCC) No date: Factor 5 Leiden mutation, heterozygous (HCC) No date: Migraine No date: Seizures (HCC) Past Surgical History: No date: ABSCESS DRAINAGE No date: CESAREAN SECTION No date: DILATION AND CURETTAGE OF UTERUS "2010": TUBAL LIGATION BMI    Body Mass Index: 36.16 kg/m     Reproductive/Obstetrics negative OB ROS                             Anesthesia Physical Anesthesia Plan  ASA: 2 and emergent  Anesthesia Plan: General ETT   Post-op Pain Management:    Induction:   PONV Risk Score and Plan:   Airway Management Planned:   Additional Equipment:   Intra-op Plan:   Post-operative Plan:   Informed Consent: I have reviewed the patients History and Physical, chart, labs and discussed the procedure including the risks, benefits and alternatives for the proposed anesthesia with the patient or authorized representative who has indicated  his/her understanding and acceptance.     Dental Advisory Given  Plan Discussed with: CRNA  Anesthesia Plan Comments:         Anesthesia Quick Evaluation

## 2021-09-18 NOTE — Anesthesia Procedure Notes (Signed)
Procedure Name: LMA Insertion Date/Time: 09/18/2021 10:49 PM  Performed by: Omer Jack, CRNAPre-anesthesia Checklist: Patient identified, Patient being monitored, Timeout performed, Emergency Drugs available and Suction available Patient Re-evaluated:Patient Re-evaluated prior to induction Oxygen Delivery Method: Circle system utilized Preoxygenation: Pre-oxygenation with 100% oxygen Induction Type: IV induction Ventilation: Mask ventilation without difficulty LMA: LMA inserted LMA Size: 4.0 Tube type: Oral Number of attempts: 1 Placement Confirmation: positive ETCO2 and breath sounds checked- equal and bilateral Tube secured with: Tape Dental Injury: Teeth and Oropharynx as per pre-operative assessment

## 2021-09-18 NOTE — Transfer of Care (Signed)
Immediate Anesthesia Transfer of Care Note  Patient: Brianna Ashley  Procedure(s) Performed: IRRIGATION AND DEBRIDEMENT PERIRECTAL ABSCESS (Right: Rectum)  Patient Location: PACU  Anesthesia Type:General  Level of Consciousness: drowsy and patient cooperative  Airway & Oxygen Therapy: Patient Spontanous Breathing and Patient connected to nasal cannula oxygen  Post-op Assessment: Report given to RN and Post -op Vital signs reviewed and stable  Post vital signs: Reviewed and stable  Last Vitals:  Vitals Value Taken Time  BP 106/58 09/18/21 2323  Temp    Pulse 67 09/18/21 2323  Resp 17 09/18/21 2323  SpO2 99 % 09/18/21 2323  Vitals shown include unvalidated device data.  Last Pain:  Vitals:   09/18/21 2200  TempSrc:   PainSc: 10-Worst pain ever         Complications: No notable events documented.

## 2021-09-18 NOTE — ED Triage Notes (Signed)
Pt reports that she has had an abscess on her intragluteal cleft in the past from a surg of her tailbone puncturing her, pt report for the past 2 days she has became increasingly sore there and thinks she has an abscess there again, pt reports that she may need to be admitted due to not having a place where she can bathe properly or have someone who can help her with the dressings

## 2021-09-18 NOTE — Op Note (Signed)
Incision and drainage perirectal abscess  Pre-operative Diagnosis: Perianal abscess  Post-operative Diagnosis: Perirectal abscess  Surgeon: Campbell Lerner, M.D., FACS  Anesthesia: General  Findings: Abscess extending from the subcutaneous tissues of the perianal area, running parallel to the rectum for 4 to 5 cm.   Estimated Blood Loss: 10 mL         Specimens: Purulent drainage for culture and sensitivity          Complications: none              Procedure Details  The patient was seen again in the Holding Room. The benefits, complications, treatment options, and expected outcomes were discussed with the patient. The risks of bleeding, infection, recurrence of symptoms, failure to resolve symptoms, unanticipated injury, prosthetic placement, prosthetic infection, any of which could require further surgery were reviewed with the patient. The likelihood of improving the patient's symptoms with return to their baseline status is expected.  The patient and/or family concurred with the proposed plan, giving informed consent.  The patient was taken to Operating Room, identified and the procedure verified.    Prior to the induction of general anesthesia, antibiotic prophylaxis was administered. VTE prophylaxis was in place.  General anesthesia was then administered and tolerated well. After the induction, the patient was positioned in the lithotomy position and the perianal area was prepped with Betadine and draped in the sterile fashion.  A Time Out was held and the above information confirmed.  I utilized the local anesthetic needle to confirm localization of the abscess by aspirating pus on the first pass.  I then proceeded with making the incision at that point with a 15 blade.  I then aspirated large volume of purulent drainage obtaining specimen for culture.  I then proceeded to digitally explore the cavity ensuring adequate drainage and adequate opening for drainage.  Once this was  completed I then irrigated the cavity with saline solution till clear.  I then placed some 1 inch iodoform packing strip to keep the wound open and wick out what ever residual drainage.  4 x 4's and ABD pads and mesh panties applied. Patient tolerated procedure well, she is transferred recovery room in stable condition.      Campbell Lerner M.D., [facility] 09/18/2021 11:15 PM

## 2021-09-18 NOTE — H&P (Signed)
Patient ID: Brianna Ashley, female   DOB: 10/04/1985, 36 y.o.   MRN: [id number]  Chief Complaint: Perianal pain  History of Present Illness Brianna Ashley is a 36 y.o. female with prior history of incision and drainage of perianal abscesses, the last 1 a little over a year ago.  She reports progressive pain in the perianal area, exacerbated by bowel activity and direct pressure.  She denies any drainage.  She denies any fevers or chills.  She currently reports her social situation is intolerable and is begging for admission perioperatively.  She reports being homeless and unable to address her hygienic needs.  She also states that she is recently requalified for Medicaid and has an appointment later mid July 2 resume her medical management, and her anticoagulation.  Past Medical History Past Medical History:  Diagnosis Date   Asthma    Carpal tunnel syndrome    DVT of lower extremity (deep venous thrombosis) (HCC)    Factor 5 Leiden mutation, heterozygous (HCC)    Migraine    Seizures (HCC)       Past Surgical History:  Procedure Laterality Date   ABSCESS DRAINAGE     CESAREAN SECTION     DILATION AND CURETTAGE OF UTERUS     TUBAL LIGATION  "2010"    Allergies  Allergen Reactions   Ambien [Zolpidem Tartrate] Other (See Comments)    death   Ambien [Zolpidem Tartrate]    Clindamycin/Lincomycin Nausea And Vomiting   Keflex [Cephalexin] Nausea And Vomiting   Methylphenidate Other (See Comments)    "I pretty much died in my dad's arms"   Ritalin [Methylphenidate Hcl] Other (See Comments)    death   Ritalin [Methylphenidate Hcl]    Zolpidem Other (See Comments)    "I pretty much died a few times"    Current Facility-Administered Medications  Medication Dose Route Frequency Provider Last Rate Last Admin   metroNIDAZOLE (FLAGYL) IVPB 500 mg  500 mg Intravenous Q12H Ronny Bacon, MD       Current Outpatient Medications  Medication Sig Dispense Refill   albuterol  (PROVENTIL HFA;VENTOLIN HFA) 108 (90 Base) MCG/ACT inhaler Inhale 2 puffs every 6 (six) hours as needed into the lungs for wheezing or shortness of breath. (Patient not taking: Reported on 09/18/2021) 1 Inhaler 0   APIXABAN (ELIQUIS) VTE STARTER PACK (10MG  AND 5MG ) Take as directed on package: start with two-5mg  tablets twice daily for 7 days. On day 8, switch to one-5mg  tablet twice daily. (Patient not taking: Reported on 09/18/2021) 1 each 0   clonazePAM (KLONOPIN) 1 MG tablet Take 1 tablet (1 mg total) by mouth 3 (three) times daily. (Patient not taking: Reported on 07/16/2020) 15 tablet 0   cyclobenzaprine (FLEXERIL) 10 MG tablet Take 1 tablet by mouth at bedtime as needed for back pain (Patient not taking: Reported on 09/18/2021)     gabapentin (NEURONTIN) 100 MG capsule Take 1 capsule (100 mg total) by mouth 3 (three) times daily. 90 capsule 2   ibuprofen (ADVIL) 800 MG tablet Take 1 tablet (800 mg total) by mouth every 8 (eight) hours as needed for moderate pain. 60 tablet 1   ketorolac (TORADOL) 10 MG tablet Take 1 tablet (10 mg total) by mouth every 6 (six) hours as needed. (Patient not taking: Reported on 09/18/2021) 20 tablet 0   oxyCODONE (ROXICODONE) 5 MG immediate release tablet Take 1 tablet (5 mg total) by mouth every 4 (four) hours as needed for severe pain. (Patient not taking:  Reported on 09/18/2021) 30 tablet 0    Family History Family History  Problem Relation Age of Onset   Factor V Leiden deficiency Mother        deceased of drug overdose      Social History Social History   Tobacco Use   Smoking status: Every Day    Packs/day: 1.00    Years: 20.00    Total pack years: 20.00    Types: Cigarettes   Smokeless tobacco: Never  Vaping Use   Vaping Use: Some days  Substance Use Topics   Alcohol use: No   Drug use: No    Comment: last use New Year's eve.          Physical Exam Blood pressure (!) 146/81, pulse 80, temperature 99.1 F (37.3 C), temperature source  Oral, resp. rate (!) 22, height 5\' 5"  (1.651 m), weight 98.6 kg, SpO2 100 %. Last Weight  Most recent update: 09/18/2021  3:50 PM    Weight  98.6 kg (217 lb 4.8 oz)             CONSTITUTIONAL: Well developed, and nourished, appropriately responsive and aware without distress.  Teary-eyed, emotionally distressed. EYES: Sclera non-icteric.   EARS, NOSE, MOUTH AND THROAT:  The oropharynx is clear. Oral mucosa is pink and moist.  Dentition: Poor   hearing is intact to voice.  NECK: Trachea is midline, and there is no jugular venous distension.  LYMPH NODES:  Lymph nodes in the neck are not enlarged. RESPIRATORY:  Normal respiratory effort without pathologic use of accessory muscles. CARDIOVASCULAR: Heart is regular in rate and rhythm. GI: The abdomen is soft, nontender, and nondistended. There were no palpable masses. I did not appreciate hepatosplenomegaly.  GU: ED nurse as chaperone.  There is a well-defined scar at 1:00 of the posterior right perianal area markedly tender, correlating with the CT findings. MUSCULOSKELETAL:  Symmetrical muscle tone appreciated in all four extremities.    SKIN: Skin turgor is normal. No pathologic skin lesions appreciated.  NEUROLOGIC:  Motor and sensation appear grossly normal.  Cranial nerves are grossly without defect. PSYCH:  Alert and oriented to person, place and time. Affect is appropriate for situation.  Data Reviewed I have personally reviewed what is currently available of the patient's imaging, recent labs and medical records.   Labs:     Latest Ref Rng & Units 09/18/2021    5:44 PM 07/14/2020   12:27 AM 03/31/2020    2:04 PM  CBC  WBC 4.0 - 10.5 K/uL 10.8  13.5  14.1   Hemoglobin 12.0 - 15.0 g/dL 05/29/2020  93.8  18.2   Hematocrit 36.0 - 46.0 % 39.0  36.9  38.6   Platelets 150 - 400 K/uL 305  324  413       Latest Ref Rng & Units 09/18/2021    5:44 PM 07/14/2020   12:27 AM 03/31/2020    2:04 PM  CMP  Glucose 70 - 99 mg/dL 99  05/29/2020  716   BUN 6  - 20 mg/dL 13  14  14    Creatinine 0.44 - 1.00 mg/dL 967   8.93   Sodium 135 - 145 mmol/L 138  136  137   Potassium 3.5 - 5.1 mmol/L 3.9  4.1  4.3   Chloride 98 - 111 mmol/L 109  106  106   CO2 22 - 32 mmol/L 24  22  22    Calcium 8.9 - 10.3 mg/dL 9.0  9.2  9.4   Total Protein 6.5 - 8.1 g/dL 7.4  7.2  7.5   Total Bilirubin 0.3 - 1.2 mg/dL 0.7  0.5  0.6   Alkaline Phos 38 - 126 U/L 77  86  66   AST 15 - 41 U/L 12  18  14    ALT 0 - 44 U/L 9  14  10        Imaging:  Within last 24 hrs: CT ABDOMEN PELVIS W CONTRAST  Result Date: 09/18/2021 CLINICAL DATA:  Anorectal abscess. EXAM: CT ABDOMEN AND PELVIS WITH CONTRAST TECHNIQUE: Multidetector CT imaging of the abdomen and pelvis was performed using the standard protocol following bolus administration of intravenous contrast. RADIATION DOSE REDUCTION: This exam was performed according to the departmental dose-optimization program which includes automated exposure control, adjustment of the mA and/or kV according to patient size and/or use of iterative reconstruction technique. CONTRAST:  OMNIPAQUE IOHEXOL 300 MG/ML  SOLN COMPARISON:  CT pelvis dated July 14, 2020. FINDINGS: Lower chest: No acute abnormality. Hepatobiliary: No focal liver abnormality is seen. No gallstones, gallbladder wall thickening, or biliary dilatation. Gallbladder adenomyomatosis. Pancreas: Unremarkable. No pancreatic ductal dilatation or surrounding inflammatory changes. Spleen: Normal in size without focal abnormality. Adrenals/Urinary Tract: Adrenal glands are unremarkable. Multifocal cortical scarring of the left kidney. No renal calculi, solid lesion, or hydronephrosis. Bladder is decompressed. Stomach/Bowel: Stomach is within normal limits. Appendix appears normal. No evidence of bowel wall thickening, distention, or inflammatory changes. Vascular/Lymphatic: No significant vascular findings are present. No enlarged abdominal or pelvic lymph nodes. Reproductive:  Uterus and bilateral adnexa are unremarkable. Other: 3.4 x 2.0 x 3.4 cm rim enhancing fluid collection in the medial right buttock near the anus (series 2, image 94), similar location to prior. Trace free fluid in the pelvis is likely physiologic. No pneumoperitoneum. Musculoskeletal: No acute or significant osseous findings. IMPRESSION: 1. 3.4 cm perianal abscess in the medial right buttock, similar location to prior. Electronically Signed   By: M.D.   On: 09/18/2021 18:41    Assessment    PeriAnal Abscess Patient Active Problem List   Diagnosis Date Noted   Factor V Leiden (HCC) 03/28/2016   Acute deep vein thrombosis (DVT) of femoral vein of left lower extremity (HCC) 03/28/2016   Lupus anticoagulant positive 03/28/2016   DVT (deep venous thrombosis) (HCC) 02/02/2016    Plan    Incision and drainage of perianal abscess. IV antibiotics. Risk discussed with the patient which include but not limited to anesthesia, bleeding, DVT/pulmonary embolism, recurrence, infection, anal fistula, etc.  I believe she understands or not all inclusive and desires to proceed.  Questions answered, no guarantees ever expressed or implied.  Face-to-face time spent with the patient and accompanying care providers(if present) was 40 minutes, with more than 50% of the time spent counseling, educating, and coordinating care of the patient.    These notes generated with voice recognition software. I apologize for typographical errors.  05/26/2016 M.D., FACS 09/18/2021, 8:47 PM

## 2021-09-18 NOTE — ED Provider Notes (Signed)
[facility] Provider Note  Patient Contact: 4:48 PM (approximate)   History   Abscess   HPI  Brianna Ashley is a 36 y.o. female with history of perianal abscesses managed by general surgery, presents to the emergency department with pain near prior incision site.  Patient states it is difficult for her to sit.  She denies any drainage from the affected area.  No fever or chills.  Patient states that her current symptoms are similar to when she is required drainage in the past.      Physical Exam   Triage Vital Signs: ED Triage Vitals  Enc Vitals Group     BP 09/18/21 1546 (!) 146/81     Pulse Rate 09/18/21 1546 80     Resp 09/18/21 1546 (!) 22     Temp 09/18/21 1546 99.1 F (37.3 C)     Temp Source 09/18/21 1546 Oral     SpO2 09/18/21 1546 100 %     Weight 09/18/21 1546 217 lb 4.8 oz (98.6 kg)     Height 09/18/21 1546 5\' 5"  (1.651 m)     Head Circumference --      Peak Flow --      Pain Score 09/18/21 1555 10     Pain Loc --      Pain Edu? --      Excl. in GC? --     Most recent vital signs: Vitals:   09/18/21 1546  BP: (!) 146/81  Pulse: 80  Resp: (!) 22  Temp: 99.1 F (37.3 C)  SpO2: 100%     General: Alert and in no acute distress. Eyes:  PERRL. EOMI. Head: No acute traumatic findings ENT:      Nose: No congestion/rhinnorhea.      Mouth/Throat: Mucous membranes are moist. Neck: No stridor. No cervical spine tenderness to palpation. Cardiovascular:  Good peripheral perfusion Respiratory: Normal respiratory effort without tachypnea or retractions. Lungs CTAB. Good air entry to the bases with no decreased or absent breath sounds. Gastrointestinal: Bowel sounds 4 quadrants. Soft and nontender to palpation. No guarding or rigidity. No palpable masses. No distention. No CVA tenderness. Musculoskeletal: Full range of motion to all extremities.  Neurologic:  No gross focal neurologic deficits are appreciated.  Skin: No significant  pain with palpation along the superior aspect of the intergluteal crease.  Patient has scarring along the superior aspect of the right buttocks.  Patient has diffuse pain to palpation but no induration or erythema around the perianal region or around the rectum. Other:   ED Results / Procedures / Treatments   Labs (all labs ordered are listed, but only abnormal results are displayed) Labs Reviewed  CBC WITH DIFFERENTIAL/PLATELET - Abnormal; Notable for the following components:      Result Value   WBC 10.8 (*)    All other components within normal limits  COMPREHENSIVE METABOLIC PANEL - Abnormal; Notable for the following components:   AST 12 (*)    All other components within normal limits  LIPASE, BLOOD  POC URINE PREG, ED        RADIOLOGY  I personally viewed and evaluated these images as part of my medical decision making, as well as reviewing the written report by the radiologist.  ED Provider Interpretation: Patient has a 3.4 cm x 3.4 cm perianal abscess near prior surgical site.   PROCEDURES:  Critical Care performed: No  Procedures   MEDICATIONS ORDERED IN ED: Medications  metroNIDAZOLE (FLAGYL) IVPB  500 mg (has no administration in time range)  morphine (PF) 4 MG/ML injection 4 mg (4 mg Intravenous Given 09/18/21 1741)  iohexol (OMNIPAQUE) 300 MG/ML solution 100 mL (100 mLs Intravenous Contrast Given 09/18/21 1821)     IMPRESSION / MDM / ASSESSMENT AND PLAN / ED COURSE  I reviewed the triage vital signs and the nursing notes.                              Assessment and plan Abscess:  36 year old female presents to the emergency department with pain near her incision site from prior perianal abscess I&D.  Patient was hypertensive at triage but vital signs were otherwise reassuring.  Patient was alert, active and nontoxic-appearing.  She did have tenderness and pain to palpation over prior incision and drainage scars but no perianal or perirectal  tenderness.  Will obtain labs and dedicated CT and will reassess.  Patient has very mild elevation white blood cell count.  CMP within range.  Urine pregnancy test negative.  CT abdomen pelvis shows a 3.4 x 3.4 perianal abscess near prior surgical site.  I reached out to general surgeon on-call, Dr. Claudine Mouton.  Very much appreciate time and consult.  Dr. Claudine Mouton personally evaluated patient at bedside and plans to admit for anticipated surgical I&D.   FINAL CLINICAL IMPRESSION(S) / ED DIAGNOSES   Final diagnoses:  Perianal abscess     Rx / DC Orders   ED Discharge Orders     None        Note:  This document was prepared using Dragon voice recognition software and may include unintentional dictation errors.   Pia Mau Eustis, PA-C 09/18/21 2101    Shaune Pollack, MD 09/21/21 1118

## 2021-09-19 ENCOUNTER — Encounter: Payer: Self-pay | Admitting: Surgery

## 2021-09-19 LAB — CBC
HCT: 39.3 % (ref 36.0–46.0)
Hemoglobin: 12.8 g/dL (ref 12.0–15.0)
MCH: 28.6 pg (ref 26.0–34.0)
MCHC: 32.6 g/dL (ref 30.0–36.0)
MCV: 87.9 fL (ref 80.0–100.0)
Platelets: 311 10*3/uL (ref 150–400)
RBC: 4.47 MIL/uL (ref 3.87–5.11)
RDW: 13.7 % (ref 11.5–15.5)
WBC: 13.9 10*3/uL — ABNORMAL HIGH (ref 4.0–10.5)
nRBC: 0 % (ref 0.0–0.2)

## 2021-09-19 MED ORDER — KETOROLAC TROMETHAMINE 30 MG/ML IJ SOLN: 30.0000 mg | Freq: Four times a day (QID) | INTRAMUSCULAR | Status: DC | PRN

## 2021-09-19 MED ORDER — DOCUSATE SODIUM 100 MG PO CAPS
100.0000 mg | ORAL_CAPSULE | Freq: Every day | ORAL | Status: DC
  Administered 2021-09-19 – 2021-09-22 (×4): 100 mg via ORAL
  Filled 2021-09-19 (×4): qty 1

## 2021-09-19 MED ORDER — MORPHINE SULFATE (PF) 2 MG/ML IV SOLN
2.0000 mg | INTRAVENOUS | Status: DC | PRN
  Administered 2021-09-19 – 2021-09-20 (×8): 2 mg via INTRAVENOUS
  Filled 2021-09-19 (×10): qty 1

## 2021-09-19 MED ORDER — LACTATED RINGERS IV SOLN: INTRAVENOUS | Status: DC

## 2021-09-19 MED ORDER — ONDANSETRON HCL 4 MG/2ML IJ SOLN
4.0000 mg | Freq: Four times a day (QID) | INTRAMUSCULAR | Status: DC | PRN
  Filled 2021-09-19: qty 2

## 2021-09-19 MED ORDER — HYDROCODONE-ACETAMINOPHEN 5-325 MG PO TABS
ORAL_TABLET | ORAL | Status: AC
  Filled 2021-09-19: qty 2

## 2021-09-19 MED ORDER — ONDANSETRON 4 MG PO TBDP: 4.0000 mg | ORAL_TABLET | Freq: Four times a day (QID) | ORAL | Status: DC | PRN

## 2021-09-19 NOTE — Progress Notes (Signed)
End of shift note:  Pt is alert and oriented. C/o of peri-rectal abscess pain that has been being treated with prn IV morphine and PO norco. Peri-rectal dressing was been reinforce and external surgical pad has been changed throughout the day. VSS. Physical assessment done and schedule medications were given

## 2021-09-19 NOTE — Progress Notes (Signed)
[facility] SURGICAL PROGRESS NOTE  Hospital Day(s): 0.   Post op day(s): 1 Day Post-Op.   Interval History:  Patient seen and examined No acute events or new complaints overnight.  Patient reports she is very frustrated this morning; having significant pain, can not get comfortable No fever, chills Slight increase in leukocytosis this morning; to 13.9K; likely reactive from surgery Cx from OR growing GPC; continues on Unasyn On diet  Vital signs in last 24 hours: [min-max] current  Temp:  [97 F (36.1 C)-99.1 F (37.3 C)] 98.5 F (36.9 C) (06/29 0605) Pulse Rate:  [53-80] 53 (06/29 0605) Resp:  [10-22] 19 (06/29 0605) BP: (100-146)/(55-81) 109/63 (06/29 0605) SpO2:  [93 %-100 %] 93 % (06/29 0605) Weight:  [98.6 kg] 98.6 kg (06/28 1546)     Height: 5\' 5"  (165.1 cm) Weight: 98.6 kg BMI (Calculated): 36.16   Intake/Output last 2 shifts:  06/28 0701 - 06/29 0700 In: 635 [I.V.:535; IV Piggyback:100] Out: -    Physical Exam:  Constitutional: alert, cooperative and no distress  Respiratory: breathing non-labored at rest  Cardiovascular: regular rate and sinus rhythm  Integumentary: perirectal I&D; I deferred dressing change as she is only a few hours post-op  Labs:     Latest Ref Rng & Units 09/19/2021    4:19 AM 09/18/2021    5:44 PM 07/14/2020   12:27 AM  CBC  WBC 4.0 - 10.5 K/uL 13.9  10.8  13.5   Hemoglobin 12.0 - 15.0 g/dL 07/16/2020  21.3  08.6   Hematocrit 36.0 - 46.0 % 39.3  39.0  36.9   Platelets 150 - 400 K/uL 311  305  324       Latest Ref Rng & Units 09/18/2021    5:44 PM 07/14/2020   12:27 AM 03/31/2020    2:04 PM  CMP  Glucose 70 - 99 mg/dL 99  05/29/2020  469   BUN 6 - 20 mg/dL 13  14  14    Creatinine 0.44 - 1.00 mg/dL 629   5.28   Sodium 135 - 145 mmol/L 138  136  137   Potassium 3.5 - 5.1 mmol/L 3.9  4.1  4.3   Chloride 98 - 111 mmol/L 109  106  106   CO2 22 - 32 mmol/L 24  22  22    Calcium 8.9 - 10.3 mg/dL 9.0  9.2  9.4   Total Protein  6.5 - 8.1 g/dL 7.4  7.2  7.5   Total Bilirubin 0.3 - 1.2 mg/dL 0.7  0.5  0.6   Alkaline Phos 38 - 126 U/L 77  86  66   AST 15 - 41 U/L 12  18  14    ALT 0 - 44 U/L 9  14  10      Imaging studies: No new pertinent imaging studies   Assessment/Plan: 36 y.o. female 1 Day Post-Op s/p incision and drainage of perirectal abscess   - Wound Care: Pack perirectal I&D site with iodoform gauze, cover, secure. This should be done daily. May need to preform dressing changes with bowel movements as well.   - Continue IV Abx (Unasyn); follow up Cx; growing GPC   - Pain control prn - Monitor leukocytosis; morning labs  - Will order HH; she states "I will refuse to leave as I have no where to go." Suspect this will be biggest barrier to discharge.   All of the above findings and recommendations were discussed with the patient, and the medical  team, and all of patient's questions were answered to her expressed satisfaction.  -- Lynden Oxford, PA-C [facility] 09/19/2021, 7:12 AM M-F: 7am - 4pm

## 2021-09-20 LAB — CBC
HCT: 35.1 % — ABNORMAL LOW (ref 36.0–46.0)
Hemoglobin: 11.7 g/dL — ABNORMAL LOW (ref 12.0–15.0)
MCH: 29.2 pg (ref 26.0–34.0)
MCHC: 33.3 g/dL (ref 30.0–36.0)
MCV: 87.5 fL (ref 80.0–100.0)
Platelets: 290 10*3/uL (ref 150–400)
RBC: 4.01 MIL/uL (ref 3.87–5.11)
RDW: 13.7 % (ref 11.5–15.5)
WBC: 15.1 10*3/uL — ABNORMAL HIGH (ref 4.0–10.5)
nRBC: 0 % (ref 0.0–0.2)

## 2021-09-20 MED ORDER — MAGNESIUM HYDROXIDE 400 MG/5ML PO SUSP
30.0000 mL | Freq: Every day | ORAL | Status: DC
  Administered 2021-09-20 – 2021-09-21 (×2): 30 mL via ORAL
  Filled 2021-09-20 (×2): qty 30

## 2021-09-20 MED ORDER — MORPHINE SULFATE (PF) 2 MG/ML IV SOLN: 1.0000 mg | INTRAVENOUS | Status: DC | PRN

## 2021-09-20 MED ORDER — KETOROLAC TROMETHAMINE 30 MG/ML IJ SOLN
30.0000 mg | Freq: Four times a day (QID) | INTRAMUSCULAR | Status: DC
  Administered 2021-09-20 – 2021-09-21 (×3): 30 mg via INTRAVENOUS
  Filled 2021-09-20 (×3): qty 1

## 2021-09-20 MED ORDER — MORPHINE SULFATE (PF) 2 MG/ML IV SOLN
2.0000 mg | Freq: Once | INTRAVENOUS | Status: AC
  Administered 2021-09-20: 2 mg via INTRAVENOUS

## 2021-09-20 MED ORDER — SODIUM CHLORIDE 0.9 % IV SOLN: INTRAVENOUS | Status: DC | PRN

## 2021-09-20 NOTE — Progress Notes (Addendum)
Educated patient how to change external surgical pad independently per the request of the PA. Patient got up to [facility], voided and then changed pad independently under the supervision of this RN. All questions were answered. Patient can now ambulate, void, and change external pad without assistance. Level 6 mobility.

## 2021-09-20 NOTE — Progress Notes (Addendum)
Spoke with the patient and the Chiropodist about ways to help the patient's pain stay under control. Plan is to write the two medications on the board with the times that the patient can have them, alternating the two so that she is getting one every two hours. Medications written down in patient's eye sight and morphine given. Pain 10/10. Will continue to monitor.

## 2021-09-20 NOTE — Progress Notes (Addendum)
[facility] SURGICAL PROGRESS NOTE  Hospital Day(s): 1.   Post op day(s): 2 Days Post-Op.   Interval History:  Patient seen and examined No acute events or new complaints overnight.  Patient reports she is still sore in her perianal area; needed outer dressing changed yesterday Voiced frustrations from yesterday  No fever, chills Leukocytosis slightly worse again this morning; 15.1K Cx from OR growing GPC; continues on Unasyn On diet; tolerating   Vital signs in last 24 hours: [min-max] current  Temp:  [98 F (36.7 C)-98.4 F (36.9 C)] 98 F (36.7 C) (06/30 0446) Pulse Rate:  [51-69] 52 (06/30 0446) Resp:  [16-18] 17 (06/30 0446) BP: (98-121)/(47-61) 98/47 (06/30 0446) SpO2:  [98 %-100 %] 99 % (06/30 0446)     Height: 5\' 5"  (165.1 cm) Weight: 98.6 kg BMI (Calculated): 36.16   Intake/Output last 2 shifts:  06/29 0701 - 06/30 0700 In: 249.6 [I.V.:49.6; IV Piggyback:200] Out: 400 [Urine:400]   Physical Exam:  Constitutional: alert, cooperative and no distress  Respiratory: breathing non-labored at rest  Cardiovascular: regular rate and sinus rhythm  Integumentary: perirectal I&D superior to the rectum on the right, no erythema, no appreciable drainage at time of dressing change, expectedly tender. Packing replaced.   Labs:     Latest Ref Rng & Units 09/20/2021    4:32 AM 09/19/2021    4:19 AM 09/18/2021    5:44 PM  CBC  WBC 4.0 - 10.5 K/uL 15.1  13.9  10.8   Hemoglobin 12.0 - 15.0 g/dL 09/20/2021  16.1  09.6   Hematocrit 36.0 - 46.0 % 35.1  39.3  39.0   Platelets 150 - 400 K/uL 290  311  305       Latest Ref Rng & Units 09/18/2021    5:44 PM 07/14/2020   12:27 AM 03/31/2020    2:04 PM  CMP  Glucose 70 - 99 mg/dL 99  05/29/2020  409   BUN 6 - 20 mg/dL 13  14  14    Creatinine 0.44 - 1.00 mg/dL 811   9.14   Sodium 135 - 145 mmol/L 138  136  137   Potassium 3.5 - 5.1 mmol/L 3.9  4.1  4.3   Chloride 98 - 111 mmol/L 109  106  106   CO2 22 - 32 mmol/L 24  22  22     Calcium 8.9 - 10.3 mg/dL 9.0  9.2  9.4   Total Protein 6.5 - 8.1 g/dL 7.4  7.2  7.5   Total Bilirubin 0.3 - 1.2 mg/dL 0.7  0.5  0.6   Alkaline Phos 38 - 126 U/L 77  86  66   AST 15 - 41 U/L 12  18  14    ALT 0 - 44 U/L 9  14  10      Imaging studies: No new pertinent imaging studies   Assessment/Plan: 36 y.o. female 2 Days Post-Op s/p incision and drainage of perirectal abscess   - Wound Care: Pack perirectal I&D site with iodoform gauze, cover, secure with tape +/- mesh underwear. This should be done daily. May need to preform dressing changes with bowel movements as well. Start today (06/30); I preformed this today  - Continue IV Abx (Unasyn); follow up Cx; growing GPC   - Pain control prn - Monitor leukocytosis; morning labs  - Will order HH; she states "I will refuse to leave as I have no where to go." Suspect this will be biggest barrier to discharge.  All of the above findings and recommendations were discussed with the patient, and the medical team, and all of patient's questions were answered to her expressed satisfaction.  -- Lynden Oxford, PA-C [facility] 09/20/2021, 7:14 AM M-F: 7am - 4pm

## 2021-09-20 NOTE — Progress Notes (Signed)
Came to room at the request of orienting nurse. Walked into room and patient started yelling at this RN about being disrespected. This RN said she was sorry the patient felt that way and asked patient what had happened to make patient feel this way. Patient stated that this RN came into her room to help the orienting nurse with the IV pump and that this RN did not address the patient. During that time patient was on the phone and this RN felt like it would be disrespectful to interrupt her conversation. While trying to explain that to the patient, patient kept interrupting and yelling at this nurse about how she was trying to get this RN's attention but that she was purposefully ignored. RN apologized and stated that she did not see or hear that patient trying to get her attention and that she would never be purposefully ignored. Patient kept on yelling about it and would not listen to this RN.  Patient then started yelling about how surgical packing had not been changed all day. RN explained that there were no orders to change the dressing and per the PA the dressing would be changed early tomorrow morning. Patient stated that the infection was leaking and that it would get worse if it was not changed. This RN said she would ask the PA but that it could not be changed without orders, only the surgical pad could be replaced like it had been done multiple times through out the day. Patient proceeded to yell at RN and stated that she was our priority, not the doctor, and we had to listen to her. Patient stated it did not matter what the doctor said or wanted. RN stated that she could not do that. Patient kept interrupting and yelling at RN. RN said that she would go get her charge nurse to explain this to patient. Patient stated that RN was ignoring patient again and that RN could not leave the room. RN said she would be back but was going to get the charge nurse. Patient started yelling louder and cussing at RN. RN  walked out and asked the charge nurse to go speak with patient.

## 2021-09-20 NOTE — Progress Notes (Signed)
Informed by charge nurse that patient did not want this RN back in her room.

## 2021-09-20 NOTE — Progress Notes (Signed)
Patient requested this RN to change her diet order to say that she could have [facility] on her trays with meals. When told that we could not do that due to the fact that that drink is not included in the patient menu and is charged to the unit, patient became agitated. Requested that this RN is not her nurse anymore and said that she could not eat her meal if she didn't have mountain dew. Chiropodist was ask to come into room to assist with speaking to the patient.

## 2021-09-20 NOTE — TOC Progression Note (Signed)
Transition of Care [facility]) - Progression Note    Patient Details  Name: Brianna Ashley MRN: [id number] Date of Birth: 1985-06-21  Transition of Care [facility]) CM/SW [place], RN Phone Number: 09/20/2021, 1:39 PM  Clinical Narrative:   Met with the patient in the room at the bedside, she reports that she uses public transportation called [employer], She has been approvd for Medicaid I explained to her if she can not afford the copay for her medication, she can notify the pharmacy where she gets her medication and they will waive the copay.  I provided her with the [facility], she stated appreciation.          Expected Discharge Plan and Services                                                 Social Determinants of Health (SDOH) Interventions    Readmission Risk Interventions     No data to display

## 2021-09-20 NOTE — Progress Notes (Addendum)
Patient became agitated with RN due to Norco being given at 1657 instead of 1630. RN explained to patient that an admission came to the floor and this RN was in that room. This RN told patient that she came as soon as she could. Explained to patient the pain medications are PRN so she needs to call out if she is in pain and needs them. Explained that this RN has 4 other patient and would try to make it in right when patient calls but might be in another room and would come as soon as able.

## 2021-09-21 LAB — AEROBIC/ANAEROBIC CULTURE W GRAM STAIN (SURGICAL/DEEP WOUND): Gram Stain: NONE SEEN

## 2021-09-21 LAB — CBC
HCT: 33.9 % — ABNORMAL LOW (ref 36.0–46.0)
Hemoglobin: 11.1 g/dL — ABNORMAL LOW (ref 12.0–15.0)
MCH: 28.8 pg (ref 26.0–34.0)
MCHC: 32.7 g/dL (ref 30.0–36.0)
MCV: 87.8 fL (ref 80.0–100.0)
Platelets: 263 10*3/uL (ref 150–400)
RBC: 3.86 MIL/uL — ABNORMAL LOW (ref 3.87–5.11)
RDW: 14 % (ref 11.5–15.5)
WBC: 10.7 10*3/uL — ABNORMAL HIGH (ref 4.0–10.5)
nRBC: 0 % (ref 0.0–0.2)

## 2021-09-21 MED ORDER — GABAPENTIN 300 MG PO CAPS
300.0000 mg | ORAL_CAPSULE | Freq: Two times a day (BID) | ORAL | Status: DC
Start: 2021-09-21 — End: 2021-09-22
  Administered 2021-09-21 – 2021-09-22 (×3): 300 mg via ORAL
  Filled 2021-09-21 (×3): qty 1

## 2021-09-21 MED ORDER — OXYCODONE-ACETAMINOPHEN 5-325 MG PO TABS
1.0000 | ORAL_TABLET | ORAL | Status: DC | PRN
  Administered 2021-09-21 – 2021-09-22 (×3): 2 via ORAL
  Filled 2021-09-21 (×3): qty 2

## 2021-09-21 MED ORDER — OXYCODONE-ACETAMINOPHEN 5-325 MG PO TABS
1.0000 | ORAL_TABLET | Freq: Four times a day (QID) | ORAL | Status: DC | PRN
  Administered 2021-09-21 (×2): 2 via ORAL
  Filled 2021-09-21 (×2): qty 2

## 2021-09-21 MED ORDER — CELECOXIB 200 MG PO CAPS
200.0000 mg | ORAL_CAPSULE | Freq: Two times a day (BID) | ORAL | Status: DC
Start: 2021-09-21 — End: 2021-09-22
  Administered 2021-09-21 – 2021-09-22 (×3): 200 mg via ORAL
  Filled 2021-09-21 (×4): qty 1

## 2021-09-21 NOTE — Progress Notes (Signed)
Subjective:  CC: Brianna Ashley is a 36 y.o. female  Hospital stay day 2, 3 Days Post-Op perirectal abscess I&D  HPI: No new complaints overnight.  States she still has pain around the area  ROS:  General: Denies weight loss, weight gain, fatigue, fevers, chills, and night sweats. Heart: Denies chest pain, palpitations, racing heart, irregular heartbeat, leg pain or swelling, and decreased activity tolerance. Respiratory: Denies breathing difficulty, shortness of breath, wheezing, cough, and sputum. GI: Denies change in appetite, heartburn, nausea, vomiting, constipation, diarrhea, and blood in stool. GU: Denies difficulty urinating, pain with urinating, urgency, frequency, blood in urine.   Objective:   Temp:  [97.6 F (36.4 C)-98.3 F (36.8 C)] 97.7 F (36.5 C) (07/01 0807) Pulse Rate:  [51-57] 57 (07/01 0807) Resp:  [16-19] 19 (07/01 0807) BP: (97-117)/(42-56) 97/42 (07/01 0807) SpO2:  [97 %-100 %] 100 % (07/01 0807)     Height: 5\' 5"  (165.1 cm) Weight: 98.6 kg BMI (Calculated): 36.16   Intake/Output this shift:   Intake/Output Summary (Last 24 hours) at 09/21/2021 1252 Last data filed at 09/21/2021 0352 Gross per 24 hour  Intake 1660.62 ml  Output 0 ml  Net 1660.62 ml    Constitutional :  alert, cooperative, appears stated age, and mild distress  Respiratory:  clear to auscultation bilaterally  Cardiovascular:  regular rate and rhythm  Gastrointestinal: soft, non-tender; bowel sounds normal; no masses,  no organomegaly.   Skin: Cool and moist. Chaperone present for exam. Perirectal wound with pink healthy granluation tissue, no drainage.  Psychiatric: Normal affect, non-agitated, not confused       LABS:     Latest Ref Rng & Units 09/18/2021    5:44 PM 07/14/2020   12:27 AM 03/31/2020    2:04 PM  CMP  Glucose 70 - 99 mg/dL 99  05/29/2020  517   BUN 6 - 20 mg/dL 13  14  14    Creatinine 0.44 - 1.00 mg/dL 616   0.73   Sodium 135 - 145 mmol/L 138  136  137   Potassium  3.5 - 5.1 mmol/L 3.9  4.1  4.3   Chloride 98 - 111 mmol/L 109  106  106   CO2 22 - 32 mmol/L 24  22  22    Calcium 8.9 - 10.3 mg/dL 9.0  9.2  9.4   Total Protein 6.5 - 8.1 g/dL 7.4  7.2  7.5   Total Bilirubin 0.3 - 1.2 mg/dL 0.7  0.5  0.6   Alkaline Phos 38 - 126 U/L 77  86  66   AST 15 - 41 U/L 12  18  14    ALT 0 - 44 U/L 9  14  10        Latest Ref Rng & Units 09/21/2021    6:46 AM 09/20/2021    4:32 AM 09/19/2021    4:19 AM  CBC  WBC 4.0 - 10.5 K/uL 10.7  15.1  13.9   Hemoglobin 12.0 - 15.0 g/dL   11/22/2021   Hematocrit 36.0 - 46.0 % 33.9  35.1  39.3   Platelets 150 - 400 K/uL 263  290  311     RADS: N/a Assessment:   Status post I&D of perirectal abscess.  Attempted dressing change initially at bedside but patient refused stating she must have her pain medications prior.  I explained to patient that part of the assessment is to see if she can tolerate dressing changes by de-escalating pain medications off  of IV.  Patient then started getting extremely irate, repeatedly stating that she has gone through this numerous times and she knows exactly what she needs.  She then started requesting to go outside to have a cigarette because that helps with her irritation.  I calmly explained to her that specific hospital policies are in place and that I cannot condone any behaviors going again such policies.  She then stated that she was previously told that she was allowed to have the specific privileges.  Again I reemphasized to her that I am in no position to go against the hospital policies, but I will be more than happy to facilitate communication between the specific individual that provided her these policies so that she can step off the hospital grounds to have a cigarette.  She did NOT request to see the specific individual afterwards, despite my explanation.  I explained to her that I will start de-escalating the pain medications and start transitioning to oral medications in preparation  for discharge. I will return to the room for a dressing change after these medications are administered.  Before I was done with our conversation, a visitor stop by the room and started to declare he was the individual responsible for all her care and that all issues related to the patient is to "come through me".  I asked for the individual's name but he refused to provide it.    When I asked the patient specifically if all medical decisions need to be deferred to this visitor of unknown significance, patient did NOT specifically state all medical decisions are to be approved by him. Before I could inquire further, the visitor started mentioning how patient was receiving negligent care here and that lawsuits are pending.  I asked for further details and if there is anything that I could potentially provide for an explanation, specifically when he mentioned how the nurses on the floor were not following physician orders.  The visitor will not elaborate, stating "he knows what is best for the patient".    I then asked him what his expectations are for the patient's care moving forward in order to clarify any possible misunderstandings or unrealistic expectations for her care moving forward.  The visitor then became irate, stating that we should NOT be meeting his expectations and we should be making the medical decisions ourselves since "you[pointing to me] are the professionals".    At this point the visitor and patient stated that they do not have anything further to discuss and asked me to leave so they can have some privacy.  I obliged to the request and reported my interaction to the charge nurse.  Prior to my departure, I did specifically mention to patient and the visitor that our conversation will be documented in the medical records.  They did not voice any specific objections to this statement.  I returned after meds administered and changed dressing by placing iodoform packing back into wound.   Chaperone present.  Pt had moderate pain but was able to complete change.  Will likely leave packing out for tomorrow's dressing change and hopefully d/c.       labs/images/medications/previous chart entries reviewed personally and relevant changes/updates noted above.

## 2021-09-22 MED ORDER — OXYCODONE-ACETAMINOPHEN 5-325 MG PO TABS: 1.0000 | ORAL_TABLET | ORAL | 0 refills | Status: AC | PRN

## 2021-09-22 MED ORDER — AMOXICILLIN-POT CLAVULANATE 875-125 MG PO TABS: 1.0000 | ORAL_TABLET | Freq: Two times a day (BID) | ORAL | 0 refills | Status: AC

## 2021-09-22 MED ORDER — DOCUSATE SODIUM 100 MG PO CAPS: 100.0000 mg | ORAL_CAPSULE | Freq: Two times a day (BID) | ORAL | 0 refills | Status: AC | PRN

## 2021-09-22 NOTE — Plan of Care (Signed)
  Problem: Activity: Goal: Risk for activity intolerance will decrease Outcome: Progressing   Problem: Pain Managment: Goal: General experience of comfort will improve Outcome: Progressing   Problem: Skin Integrity: Goal: Risk for impaired skin integrity will decrease Outcome: Progressing   

## 2021-09-22 NOTE — Progress Notes (Signed)
PT discharged home in stable condition. Discharge instructions given. Pt verbalized understanding. Scripts sent to pharmacy of choice. No immediate questions or concerns at this time.

## 2021-09-22 NOTE — Plan of Care (Signed)
  Problem: Clinical Measurements: Goal: Will remain free from infection Outcome: Adequate for Discharge Goal: Diagnostic test results will improve Outcome: Adequate for Discharge   Problem: Education: Goal: Knowledge of General Education information will improve Description: Including pain rating scale, medication(s)/side effects and non-pharmacologic comfort measures Outcome: Completed/Met   Problem: Health Behavior/Discharge Planning: Goal: Ability to manage health-related needs will improve Outcome: Completed/Met   Problem: Clinical Measurements: Goal: Ability to maintain clinical measurements within normal limits will improve Outcome: Completed/Met Goal: Respiratory complications will improve Outcome: Completed/Met Goal: Cardiovascular complication will be avoided Outcome: Completed/Met   Problem: Activity: Goal: Risk for activity intolerance will decrease Outcome: Completed/Met   Problem: Nutrition: Goal: Adequate nutrition will be maintained Outcome: Completed/Met   Problem: Coping: Goal: Level of anxiety will decrease Outcome: Completed/Met   Problem: Elimination: Goal: Will not experience complications related to bowel motility Outcome: Completed/Met Goal: Will not experience complications related to urinary retention Outcome: Completed/Met   Problem: Pain Managment: Goal: General experience of comfort will improve Outcome: Completed/Met   Problem: Safety: Goal: Ability to remain free from injury will improve Outcome: Completed/Met   Problem: Skin Integrity: Goal: Risk for impaired skin integrity will decrease Outcome: Completed/Met

## 2021-09-22 NOTE — Progress Notes (Signed)
Patient does not have access to the bathroom.  A 3in1 BSC will alleviate this problem

## 2021-09-22 NOTE — TOC Transition Note (Addendum)
Transition of Care [facility]) - CM/SW Discharge Note   Patient Details  Name: Brianna Ashley MRN: [id number] Date of Birth: [birth year]  Transition of Care [facility]) CM/SW Contact:  Liliana Cline, LCSW Phone Number: 09/22/2021, 9:44 AM   Clinical Narrative:    Patient to DC home today. Resources provided by [facility] on 6/30.  Spoke to Surgeon who stated patient does not need HH, that patient can do daily changes of outer gauze independently and is being sent home with extra supplies. Per RN, patient requesting BSC since she lives in a shed. BSC ordered through [employer] to be delivered to bedside today prior to DC.   10:07- Patient ready to DC and does not want to wait for [facility] to be delivered to her room.  Patient agreeable to [facility] being delivered to her home, she is aware it would not be today.  Patient confirmed home address and requested a call before [facility] is delivered to the home tomorrow, CSW notified [place] with [employer]. Plan for home deliery tomorrow 7/3.  10:13- Call from [facility] with [employer] who stated patient has [facility] which does not cover DME. Per [place] they are not able to provide a charity [facility] since patient does have insurance coverage. Patient has option to private pay for $38. Informed patient who declined.    Final next level of care: Home/Self Care Barriers to Discharge: Barriers Resolved   Patient Goals and CMS Choice        Discharge Placement                       Discharge Plan and Services   Discharge Planning Services: CM Consult, Medication Assistance            DME Arranged: N/A DME Agency: NA       HH Arranged: NA HH Agency: NA        Social Determinants of Health (SDOH) Interventions     Readmission Risk Interventions     No data to display

## 2021-09-22 NOTE — Progress Notes (Signed)
Mobility Specialist - Progress Note   09/22/21 1000  Mobility  Activity Ambulated independently in hallway  Level of Assistance Independent  Assistive Device None  Distance Ambulated (ft) 160 ft  Activity Response Tolerated well  $Mobility charge 1 Mobility    Clarisa Schools Mobility Specialist 09/22/21, 10:03 AM

## 2021-09-25 NOTE — Discharge Summary (Signed)
Physician Discharge Summary  Patient ID: Brianna Ashley MRN: [id number] DOB/AGE: 35-Apr-1987 35 y.o.  Admit date: 09/18/2021 Discharge date: 09/22/21  Admission Diagnoses: perirectal abscess  Discharge Diagnoses:  Same as above  Discharged Condition: good  Hospital Course: admitted for above. Underwent surgery.  Please see op note for details.  Post op, recovered as expected.  At time of d/c, tolerating diet and pain controlled. Packing removed and advised to keep area covered with daily dressing changes until healed completely.  Sent home with meds as below.  Bedside commode also ordered to facilitate better hygiene as patient states she is homeless.  Resources provided for additional aid if needed by case manager prior to d/c as well.  Work not provided keeping patient out of work until followup visit with dr. Claudine Mouton  Consults: None  Discharge Exam: Blood pressure 98/60, pulse (!) 58, temperature 98.3 F (36.8 C), resp. rate 18, height 5\' 5"  (1.651 m), weight 98.6 kg, SpO2 93 %. General appearance: alert, cooperative, and no distress Skin: chaperone present for exam.  I*D Area noted to have minimal serous drainage, with clean, pink granulation tissue.   Disposition:  Discharge disposition: 01-Home or Self Care       Discharge Instructions     DME Bedside commode   Complete by: As directed    Patient needs a bedside commode to treat with the following condition: Perirectal abscess   Discharge patient   Complete by: As directed    Discharge disposition: 01-Home or Self Care   Discharge patient date: 09/22/2021      Allergies as of 09/22/2021       Reactions   Ambien [zolpidem Tartrate] Other (See Comments)   death   Ambien [zolpidem Tartrate]    Clindamycin/lincomycin Nausea And Vomiting   Keflex [cephalexin] Nausea And Vomiting   Methylphenidate Other (See Comments)   "I pretty much died in my dad's arms"   Ritalin [methylphenidate Hcl] Other (See Comments)    death   Ritalin [methylphenidate Hcl]    Zolpidem Other (See Comments)   "I pretty much died a few times"        Medication List     STOP taking these medications    albuterol 108 (90 Base) MCG/ACT inhaler Commonly known as: VENTOLIN HFA   Apixaban Starter Pack (10mg  and 5mg ) Commonly known as: ELIQUIS STARTER PACK   clonazePAM 1 MG tablet Commonly known as: KLONOPIN   cyclobenzaprine 10 MG tablet Commonly known as: FLEXERIL   ibuprofen 800 MG tablet Commonly known as: ADVIL   ketorolac 10 MG tablet Commonly known as: TORADOL   oxyCODONE 5 MG immediate release tablet Commonly known as: Roxicodone       TAKE these medications    amoxicillin-clavulanate 875-125 MG tablet Commonly known as: AUGMENTIN Take 1 tablet by mouth 2 (two) times daily for 10 days.   docusate sodium 100 MG capsule Commonly known as: Colace Take 1 capsule (100 mg total) by mouth 2 (two) times daily as needed for up to 10 days for mild constipation.   gabapentin 100 MG capsule Commonly known as: Neurontin Take 1 capsule (100 mg total) by mouth 3 (three) times daily.   oxyCODONE-acetaminophen 5-325 MG tablet Commonly known as: Percocet Take 1 tablet by mouth every 4 (four) hours as needed for up to 5 days for severe pain.               Durable Medical Equipment  (From admission, onward)  Start     Ordered   09/22/21 0000  DME Bedside commode       Question:  Patient needs a bedside commode to treat with the following condition  Answer:  Perirectal abscess   09/22/21 0623            Follow-up Information     Campbell Lerner, MD. Schedule an appointment as soon as possible for a visit in 2 week(s).   Specialty: General Surgery Contact information: [address] [place] [address] [phone]                  Total time spent arranging discharge was >51min. Signed: Sung Amabile 09/25/2021, 11:19 AM

## 2021-09-25 NOTE — Anesthesia Postprocedure Evaluation (Signed)
Anesthesia Post Note  Patient: Brianna Ashley  Procedure(s) Performed: IRRIGATION AND DEBRIDEMENT PERIRECTAL ABSCESS (Right: Rectum)  Patient location during evaluation: PACU Anesthesia Type: General Level of consciousness: awake and alert Pain management: pain level controlled Vital Signs Assessment: post-procedure vital signs reviewed and stable Respiratory status: spontaneous breathing, nonlabored ventilation, respiratory function stable and patient connected to nasal cannula oxygen Cardiovascular status: blood pressure returned to baseline and stable Postop Assessment: no apparent nausea or vomiting Anesthetic complications: no   No notable events documented.   Last Vitals:  Vitals:   09/22/21 0517 09/22/21 0751  BP: 115/63 98/60  Pulse: 60 (!) 58  Resp: 18 18  Temp: 37.1 C 36.8 C  SpO2: 100% 93%    Last Pain:  Vitals:   09/22/21 0935  TempSrc:   PainSc: 4                  Yevette Edwards

## 2021-12-16 ENCOUNTER — Emergency Department
Admission: EM | Admit: 2021-12-16 | Discharge: 2021-12-16 | Disposition: A | Discharge status: Home / Self Care | Payer: Medicaid Other | Attending: Emergency Medicine | Admitting: Emergency Medicine

## 2021-12-16 ENCOUNTER — Other Ambulatory Visit: Payer: Self-pay

## 2021-12-16 ENCOUNTER — Encounter: Payer: Self-pay | Admitting: Medical Oncology

## 2021-12-16 ENCOUNTER — Emergency Department: Payer: Medicaid Other

## 2021-12-16 DIAGNOSIS — I808 Phlebitis and thrombophlebitis of other sites: Secondary | ICD-10-CM | POA: Insufficient documentation

## 2021-12-16 MED ORDER — APIXABAN 5 MG PO TABS
5.0000 mg | ORAL_TABLET | Freq: Two times a day (BID) | ORAL | 1 refills | Status: DC
  Filled 2021-12-16: qty 60, 30d supply, fill #0
  Filled 2022-02-03: qty 60, 30d supply, fill #1

## 2021-12-16 MED ORDER — HYDROCODONE-ACETAMINOPHEN 5-325 MG PO TABS
1.0000 | ORAL_TABLET | Freq: Three times a day (TID) | ORAL | 0 refills | Status: AC | PRN
Start: 2021-12-16 — End: 2021-12-19

## 2021-12-16 MED ORDER — HYDROCODONE-ACETAMINOPHEN 5-325 MG PO TABS
1.0000 | ORAL_TABLET | Freq: Once | ORAL | Status: AC
  Administered 2021-12-16: 1 via ORAL
  Filled 2021-12-16: qty 1

## 2021-12-16 NOTE — Discharge Instructions (Addendum)
Take the prescription pain medicine as needed. Follow-up with your provider as needed. Restart your Eliquis.

## 2021-12-16 NOTE — ED Notes (Signed)
See triage note  Presents with pain and swelling to left upper arm  States woke up with this yesterday  Having increased pain today

## 2021-12-16 NOTE — ED Triage Notes (Signed)
Pt ambulatory to triage with reports that she began having left arm pain yesterday, right in the bend of arm area with swelling. Pt denies injury. States that she has a clotting disorder and that she is supposed to take eliquis but has been off of it for months bc insurance doesn't cover it. Pt thinks that she has a blood clot.

## 2021-12-16 NOTE — ED Provider Notes (Signed)
[facility] Emergency Department Provider Note     Event Date/Time   First MD Initiated Contact with Patient 12/16/21 1741     (approximate)   History   Arm Pain   HPI  Brianna Ashley is a 36 y.o. female history of DVT and factor V [place], presents to the ED for evaluation of antecubital left arm pain patient reports onset of symptoms yesterday denies any preceding injury, trauma, recent venipuncture.  Patient admits that she has been unable to take her Eliquis due to lack of insurance coverage.  She denies any fevers, chills, sweats, chest pain, or shortness of breath.     Physical Exam   Triage Vital Signs: ED Triage Vitals [12/16/21 1533]  Enc Vitals Group     BP (!) 145/54     Pulse Rate 67     Resp 16     Temp 98.1 F (36.7 C)     Temp Source Oral     SpO2 100 %     Weight 216 lb 0.8 oz (98 kg)     Height 5\' 5"  (1.651 m)     Head Circumference      Peak Flow      Pain Score 10     Pain Loc      Pain Edu?      Excl. in GC?     Most recent vital signs: Vitals:   12/16/21 1533  BP: (!) 145/54  Pulse: 67  Resp: 16  Temp: 98.1 F (36.7 C)  SpO2: 100%    General Awake, no distress. NAD CV:  Good peripheral perfusion.  RESP:  Normal effort.  ABD:  No distention.  SKIN:  Left antecubital region with a medial area of erythema, warmth, or tenderness.  Patient exquisitely tender to palpation which limits the ability to evaluate the area.  No skin breakdown, pointing, fluctuance, or induration noted.   ED Results / Procedures / Treatments   Labs (all labs ordered are listed, but only abnormal results are displayed) Labs Reviewed - No data to display   EKG    RADIOLOGY  I personally viewed and evaluated these images as part of my medical decision making, as well as reviewing the written report by the radiologist.  ED Provider Interpretation: superficial thrombophlebitis  12/18/21 Venous Img Upper Uni Left  Result Date:  12/16/2021 CLINICAL DATA:  Left upper extremity pain.  History of blood clots. EXAM: LEFT UPPER EXTREMITY VENOUS DOPPLER ULTRASOUND TECHNIQUE: Gray-scale sonography with graded compression, as well as color Doppler and duplex ultrasound were performed to evaluate the upper extremity deep venous system from the level of the subclavian vein and including the jugular, axillary, basilic, radial, ulnar and upper cephalic vein. Spectral Doppler was utilized to evaluate flow at rest and with distal augmentation maneuvers. COMPARISON:  Left upper extremity venous Doppler ultrasound-06/21/2018 FINDINGS: Contralateral Subclavian Vein: Respiratory phasicity is normal and symmetric with the symptomatic side. No evidence of thrombus. Normal compressibility. Internal Jugular Vein: No evidence of thrombus. Normal compressibility, respiratory phasicity and response to augmentation. Subclavian Vein: No evidence of thrombus. Normal compressibility, respiratory phasicity and response to augmentation. Axillary Vein: No evidence of thrombus. Normal compressibility, respiratory phasicity and response to augmentation. Cephalic Vein: No evidence of acute or chronic thrombus. Normal compressibility, respiratory phasicity and response to augmentation. Basilic Vein: There is hypoechoic occlusive thrombus involving the left basilic vein (images 17 through 21). Brachial Veins: No evidence of thrombus. Normal compressibility, respiratory phasicity and  response to augmentation. Radial Veins: No evidence of thrombus. Normal compressibility, respiratory phasicity and response to augmentation. Ulnar Veins: No evidence of thrombus. Normal compressibility, respiratory phasicity and response to augmentation. Other Findings:  None visualized. IMPRESSION: 1. No evidence of DVT within the left upper extremity. 2. Examination is positive for occlusive superficial thrombophlebitis involving the left basilic vein. 3. Interval resolution of previously noted  occlusive SVT involving the left cephalic vein. Electronically Signed   By: Sandi Mariscal M.D.   On: 12/16/2021 16:35     PROCEDURES:  Critical Care performed: No  Procedures   MEDICATIONS ORDERED IN ED: Medications  HYDROcodone-acetaminophen (NORCO/VICODIN) 5-325 MG per tablet 1 tablet (1 tablet Oral Given 12/16/21 1846)     IMPRESSION / MDM / ASSESSMENT AND PLAN / ED COURSE  I reviewed the triage vital signs and the nursing notes.                              Differential diagnosis includes, but is not limited to, DVT, SVT, cellulitis, abscess, abrasion, hematoma  Patient's presentation is most consistent with acute complicated illness / injury requiring diagnostic workup.  Patient's diagnosis is consistent with superficial venous thrombosis.  Patient to the ED with a history of DVT not currently on anticoagulation therapy, with some acute pain to the left antecubital region.  Evaluated for complaints in the ED, and found to have evidence of a superficial cephalic thrombophlebitis.  Patient will be discharged home with prescriptions for hydrocodone as well as a courtesy prescription of Eliquis being sent to the medication management program. Patient is to follow up with primary provider as needed or otherwise directed. Patient is given ED precautions to return to the ED for any worsening or new symptoms.     FINAL CLINICAL IMPRESSION(S) / ED DIAGNOSES   Final diagnoses:  Thrombophlebitis arm     Rx / DC Orders   ED Discharge Orders          Ordered    HYDROcodone-acetaminophen (NORCO) 5-325 MG tablet  3 times daily PRN        12/16/21 1836    apixaban (ELIQUIS) 5 MG TABS tablet  2 times daily        12/16/21 1838             Note:  This document was prepared using Dragon voice recognition software and may include unintentional dictation errors.    Melvenia Needles, PA-C 12/17/21 0011    Blake Divine, MD 12/25/21 0730

## 2021-12-17 ENCOUNTER — Other Ambulatory Visit: Payer: Self-pay

## 2021-12-17 NOTE — Congregational Nurse Program (Signed)
  Dept: [phone]   Congregational Nurse Program Note  Date of Encounter: 12/17/2021 Client to [facility] for assistance with obtaining her Eliquis prescription. Per client her prescription was sent to Medication management by the ED MD. RN contacted [facility], they do have a prescription ready, client will need to have their application completed to pick up the prescription. Rn assisted client with application, she will take the application with her today to pick up her medication. RN also spoke with client about getting set up at the [facility]. This has been discussed in the past. No other needs at this time. Past Medical History: Past Medical History:  Diagnosis Date   Asthma    Carpal tunnel syndrome    DVT of lower extremity (deep venous thrombosis) (HCC)    Factor 5 Leiden mutation, heterozygous ([place])    Migraine    Seizures ([place])     Encounter Details:  CNP Questionnaire - 12/17/21 1058       Questionnaire   Do you give verbal consent to treat you today? Yes    Location Patient [place] or Organization    Patient Status Homeless    Insurance Uninsured ([employer]/Self-Pay)    Insurance Referral N/A    Medication Have Medication Insecurities    Medical Provider No   application filled out for [facility]   Screening Referrals N/A    Medical Referral [facility]   [facility]   Medical Appointment Made N/A    Food Have Food Insecurities    Transportation N/A   client uses the [place] bus system   Housing/Utilities N/A    Interpersonal Safety N/A    Intervention Case Management;Support    ED Visit Averted N/A    Life-Saving Intervention Made N/A

## 2021-12-20 ENCOUNTER — Other Ambulatory Visit: Payer: Self-pay

## 2022-02-02 ENCOUNTER — Encounter: Payer: Self-pay | Admitting: Emergency Medicine

## 2022-02-02 ENCOUNTER — Emergency Department
Admission: EM | Admit: 2022-02-02 | Discharge: 2022-02-02 | Disposition: A | Discharge status: Home / Self Care | Payer: Medicaid Other | Attending: Emergency Medicine | Admitting: Emergency Medicine

## 2022-02-02 ENCOUNTER — Other Ambulatory Visit: Payer: Self-pay

## 2022-02-02 ENCOUNTER — Emergency Department: Payer: Medicaid Other

## 2022-02-02 DIAGNOSIS — J45909 Unspecified asthma, uncomplicated: Secondary | ICD-10-CM | POA: Insufficient documentation

## 2022-02-02 DIAGNOSIS — J020 Streptococcal pharyngitis: Secondary | ICD-10-CM | POA: Insufficient documentation

## 2022-02-02 DIAGNOSIS — Z20822 Contact with and (suspected) exposure to covid-19: Secondary | ICD-10-CM | POA: Insufficient documentation

## 2022-02-02 LAB — CBC WITH DIFFERENTIAL/PLATELET
Abs Immature Granulocytes: 0.13 10*3/uL — ABNORMAL HIGH (ref 0.00–0.07)
Basophils Absolute: 0.1 10*3/uL (ref 0.0–0.1)
Basophils Relative: 0 %
Eosinophils Absolute: 0 10*3/uL (ref 0.0–0.5)
Eosinophils Relative: 0 %
HCT: 36.4 % (ref 36.0–46.0)
Hemoglobin: 12.5 g/dL (ref 12.0–15.0)
Immature Granulocytes: 1 %
Lymphocytes Relative: 5 %
Lymphs Abs: 1 10*3/uL (ref 0.7–4.0)
MCH: 29.8 pg (ref 26.0–34.0)
MCHC: 34.3 g/dL (ref 30.0–36.0)
MCV: 86.7 fL (ref 80.0–100.0)
Monocytes Absolute: 1.6 10*3/uL — ABNORMAL HIGH (ref 0.1–1.0)
Monocytes Relative: 8 %
Neutro Abs: 17.6 10*3/uL — ABNORMAL HIGH (ref 1.7–7.7)
Neutrophils Relative %: 86 %
Platelets: 241 10*3/uL (ref 150–400)
RBC: 4.2 MIL/uL (ref 3.87–5.11)
RDW: 13.7 % (ref 11.5–15.5)
WBC: 20.3 10*3/uL — ABNORMAL HIGH (ref 4.0–10.5)
nRBC: 0 % (ref 0.0–0.2)

## 2022-02-02 LAB — BASIC METABOLIC PANEL
Anion gap: 5 (ref 5–15)
BUN: 10 mg/dL (ref 6–20)
CO2: 23 mmol/L (ref 22–32)
Calcium: 8.7 mg/dL — ABNORMAL LOW (ref 8.9–10.3)
Chloride: 107 mmol/L (ref 98–111)
Creatinine, Ser: 1.08 mg/dL — ABNORMAL HIGH (ref 0.44–1.00)
GFR, Estimated: >60 mL/min (ref >60)
Glucose, Bld: 131 mg/dL — ABNORMAL HIGH (ref 70–99)
Potassium: 3.6 mmol/L (ref 3.5–5.1)
Sodium: 135 mmol/L (ref 135–145)

## 2022-02-02 LAB — RESP PANEL BY RT-PCR (FLU A&B, COVID) ARPGX2
Influenza A by PCR: NEGATIVE
Influenza B by PCR: NEGATIVE
SARS Coronavirus 2 by RT PCR: NEGATIVE

## 2022-02-02 LAB — GROUP A STREP BY PCR: Group A Strep by PCR: DETECTED — AB

## 2022-02-02 MED ORDER — KETOROLAC TROMETHAMINE 15 MG/ML IJ SOLN
15.0000 mg | Freq: Once | INTRAMUSCULAR | Status: AC
  Administered 2022-02-02: 15 mg via INTRAVENOUS
  Filled 2022-02-02: qty 1

## 2022-02-02 MED ORDER — SODIUM CHLORIDE 0.9 % IV BOLUS
1000.0000 mL | Freq: Once | INTRAVENOUS | Status: AC
  Administered 2022-02-02: 1000 mL via INTRAVENOUS

## 2022-02-02 MED ORDER — DEXAMETHASONE SODIUM PHOSPHATE 10 MG/ML IJ SOLN
10.0000 mg | Freq: Once | INTRAMUSCULAR | Status: AC
  Administered 2022-02-02: 10 mg via INTRAVENOUS
  Filled 2022-02-02: qty 1

## 2022-02-02 MED ORDER — AMOXICILLIN 500 MG PO CAPS
500.0000 mg | ORAL_CAPSULE | Freq: Two times a day (BID) | ORAL | 0 refills | Status: AC
  Filled 2022-02-02: qty 20, 10d supply, fill #0

## 2022-02-02 MED ORDER — ONDANSETRON HCL 4 MG/2ML IJ SOLN
4.0000 mg | Freq: Once | INTRAMUSCULAR | Status: AC
  Administered 2022-02-02: 4 mg via INTRAVENOUS
  Filled 2022-02-02: qty 2

## 2022-02-02 MED ORDER — PREDNISONE 10 MG PO TABS
ORAL_TABLET | ORAL | 0 refills | Status: AC
  Filled 2022-02-02: qty 30, 10d supply, fill #0

## 2022-02-02 MED ORDER — IOHEXOL 300 MG/ML  SOLN
75.0000 mL | Freq: Once | INTRAMUSCULAR | Status: AC | PRN
  Administered 2022-02-02: 75 mL via INTRAVENOUS

## 2022-02-02 NOTE — ED Triage Notes (Signed)
Pt reports yesterday started with bodyaches, sore throat, and ear pain and is concerned she has strep throat.

## 2022-02-02 NOTE — ED Provider Notes (Signed)
[facility] Provider Note    Event Date/Time   First MD Initiated Contact with Patient 02/02/22 1105     (approximate)   History   Chief Complaint Sore Throat, Otalgia, Generalized Body Aches, and Fever   HPI Brianna Ashley is a 36 y.o. female, history of DVT, factor V Leyden, asthma, migraine, seizures, presents to the emergency department for evaluation of sore throat.  Patient states that developed suddenly overnight.  Endorses sore throat, dysphagia, difficulty opening her mouth, and coughing up blood/"white stuff".  Endorsing body aches as well.  She states that she has had persistent problems with sore throat in the past.  Denies chest pain, shortness of breath, abdominal pain, flank pain, nausea/vomiting, diarrhea, dysuria, rash/lesions, or dizziness/lightheadedness.  History Limitations: No limitations.        Physical Exam  Triage Vital Signs: ED Triage Vitals  Enc Vitals Group     BP 02/02/22 1010 125/69     Pulse Rate 02/02/22 1010 (!) 109     Resp 02/02/22 1010 20     Temp 02/02/22 1010 99.1 F (37.3 C)     Temp Source 02/02/22 1010 Oral     SpO2 02/02/22 1010 98 %     Weight 02/02/22 1004 216 lb 0.8 oz (98 kg)     Height 02/02/22 1004 5\' 5"  (1.651 m)     Head Circumference --      Peak Flow --      Pain Score 02/02/22 1004 10     Pain Loc --      Pain Edu? --      Excl. in GC? --     Most recent vital signs: Vitals:   02/02/22 1010 02/02/22 1333  BP: 125/69 120/70  Pulse: (!) 109 90  Resp: 20 18  Temp: 99.1 F (37.3 C) 98.9 F (37.2 C)  SpO2: 98% 98%    General: Awake, appears uncomfortable. Skin: Warm, dry. No rashes or lesions.  Eyes: PERRL. Conjunctivae normal.  CV: Good peripheral perfusion.  Resp: Normal effort.  Abd: Soft, non-tender. No distention.  Neuro: At baseline. No gross neurological deficits.  Musculoskeletal: Normal ROM of all extremities.  Focused Exam: Throat is exquisitely erythematous with  bilateral tonsillar swelling and exudates.  Uvula midline.  Mild trismus.  Dysphonia present.  Patient does have mild erythema and tenderness along the soft tissue of the neck under the mandible.   Both TMs are notably erythematous, no significant effusions.  No ruptures.  No bleeding or drainage in the external auditory canal.  Physical Exam    ED Results / Procedures / Treatments  Labs (all labs ordered are listed, but only abnormal results are displayed) Labs Reviewed  GROUP A STREP BY PCR - Abnormal; Notable for the following components:      Result Value   Group A Strep by PCR DETECTED (*)    All other components within normal limits  CBC WITH DIFFERENTIAL/PLATELET - Abnormal; Notable for the following components:   WBC 20.3 (*)    Neutro Abs 17.6 (*)    Monocytes Absolute 1.6 (*)    Abs Immature Granulocytes 0.13 (*)    All other components within normal limits  BASIC METABOLIC PANEL - Abnormal; Notable for the following components:   Glucose, Bld 131 (*)    Creatinine, Ser 1.08 (*)    Calcium 8.7 (*)    All other components within normal limits  RESP PANEL BY RT-PCR (FLU A&B, COVID) [id number]  POC URINE PREG, ED     EKG N/A.    RADIOLOGY  ED Provider Interpretation: I personally reviewed and interpreted the CT scan, no evidence of obvious abscess.  CT Soft Tissue Neck W Contrast  Result Date: 02/02/2022 CLINICAL DATA:  Soft tissue swelling, infection suspected, neck xray done Concern for ludwig EXAM: CT NECK WITH CONTRAST TECHNIQUE: Multidetector CT imaging of the neck was performed using the standard protocol following the bolus administration of intravenous contrast. RADIATION DOSE REDUCTION: This exam was performed according to the departmental dose-optimization program which includes automated exposure control, adjustment of the mA and/or kV according to patient size and/or use of iterative reconstruction technique. CONTRAST:  28mL OMNIPAQUE IOHEXOL 300 MG/ML  SOLN  COMPARISON:  None Available. FINDINGS: Pharynx and larynx: Bilateral enlarged and edematous palatine tonsils with striated enhancement compatible with tonsillitis. Heterogeneous hypoenhancement within both tonsils is concerning for phlegmonous change/early abscess formation, but there is no discrete, peripherally enhancing abscess at this time. Unremarkable larynx and epiglottis. Salivary glands: No inflammation, mass, or stone. Thyroid: Normal. Lymph nodes: Enlarged upper cervical chain lymph nodes bilaterally, probably reactive given the above findings. Vascular: Negative. Limited intracranial: Negative. Visualized orbits: Negative. Mastoids and visualized paranasal sinuses: Clear. Skeleton: No acute findings. Upper chest: Visualized lung apices are clear. IMPRESSION: Findings compatible with bilateral tonsillitis. Heterogeneous hypoenhancement within both tonsils is concerning for phlegmonous change/early abscess formation, but there is no discrete, peripherally enhancing abscess at this time. Electronically Signed   By: Feliberto Harts M.D.   On: 02/02/2022 13:28    PROCEDURES:  Critical Care performed: N/A.  Procedures    MEDICATIONS ORDERED IN ED: Medications  sodium chloride 0.9 % bolus 1,000 mL (0 mLs Intravenous Stopped 02/02/22 1252)  dexamethasone (DECADRON) injection 10 mg (10 mg Intravenous Given 02/02/22 1149)  ketorolac (TORADOL) 15 MG/ML injection 15 mg (15 mg Intravenous Given 02/02/22 1149)  ondansetron (ZOFRAN) injection 4 mg (4 mg Intravenous Given 02/02/22 1252)  iohexol (OMNIPAQUE) 300 MG/ML solution 75 mL (75 mLs Intravenous Contrast Given 02/02/22 1256)     IMPRESSION / MDM / ASSESSMENT AND PLAN / ED COURSE  I reviewed the triage vital signs and the nursing notes.                              Differential diagnosis includes, but is not limited to, strep pharyngitis, tonsillitis, peritonsillar abscess, retropharyngeal abscess, Ludwig angina.  ED Course Patient  appears clinically stable, but is notably tachycardic at 109 with elevated temperature at 99.1 F.  Presentation concerning for strep pharyngitis versus Ludwig angina.  Will initiate IV fluids, dexamethasone, and ketorolac.  CBC shows notable leukocytosis at 20.3.  No anemia present.  No thrombocytopenia.  BMP shows elevated creatinine at 1.08, otherwise no significant electrolyte abnormalities.  Respiratory panel negative for COVID-19 or influenza.  Group A strep PCR positive.  Assessment/Plan Patient presents with sore throat x2 days.  Physical exam impressive for significant tonsillar swelling and exudates.  Mild trismus noted.  Lab work-up shows significant leukocytosis.  Fortunately, her CT scan does not show any discrete abscess, but does show a heterogenous hypoenhancement suggestive of possible early abscess formation.  She did test positive for strep.  Treated here with IV fluids and dexamethasone.  She states that she is ready to leave.  We will provide her with a prescription for prednisone and amoxicillin.  Advised her to monitor her symptoms closely and to return promptly if she  fails to improve.  Encouraged her to follow-up with her regular doctor within the next few days as needed.  Will discharge.  Considered admission for this patient, but given her stable presentation, she is unlikely to benefit from admission.  Provided the patient with anticipatory guidance, return precautions, and educational material. Encouraged the patient to return to the emergency department at any time if they begin to experience any new or worsening symptoms. Patient expressed understanding and agreed with the plan.   Patient's presentation is most consistent with acute complicated illness / injury requiring diagnostic workup.       FINAL CLINICAL IMPRESSION(S) / ED DIAGNOSES   Final diagnoses:  Strep pharyngitis     Rx / DC Orders   ED Discharge Orders          Ordered    amoxicillin  (AMOXIL) 500 MG tablet  2 times daily        02/02/22 1348    predniSONE (DELTASONE) 10 MG tablet  Q breakfast        02/02/22 1348             Note:  This document was prepared using Dragon voice recognition software and may include unintentional dictation errors.   Varney Daily, [place] 02/02/22 1404    Shaune Pollack, MD 02/02/22 2241

## 2022-02-02 NOTE — Discharge Instructions (Addendum)
-  Please take the full course of the amoxicillin and prednisone as prescribed.  -Follow-up with your regular doctor within the next 3 days if your symptoms fail to improve.  -You may take Tylenol/ibuprofen as needed for pain and discomfort.  -Return to the emergency department anytime if you begin to experience any new or worsening symptoms.

## 2022-02-02 NOTE — ED Notes (Signed)
See triage note.  Presents with sore throat,body aches and low grade temp  Increased pain with swallowing

## 2022-02-03 ENCOUNTER — Other Ambulatory Visit: Payer: Self-pay

## 2022-03-08 ENCOUNTER — Other Ambulatory Visit: Payer: Self-pay

## 2022-03-08 DIAGNOSIS — W503XXA Accidental bite by another person, initial encounter: Secondary | ICD-10-CM | POA: Insufficient documentation

## 2022-03-08 DIAGNOSIS — Z7901 Long term (current) use of anticoagulants: Secondary | ICD-10-CM | POA: Insufficient documentation

## 2022-03-08 DIAGNOSIS — Z23 Encounter for immunization: Secondary | ICD-10-CM | POA: Diagnosis not present

## 2022-03-08 DIAGNOSIS — S6991XA Unspecified injury of right wrist, hand and finger(s), initial encounter: Secondary | ICD-10-CM | POA: Diagnosis present

## 2022-03-08 DIAGNOSIS — J45909 Unspecified asthma, uncomplicated: Secondary | ICD-10-CM | POA: Insufficient documentation

## 2022-03-08 DIAGNOSIS — S61250A Open bite of right index finger without damage to nail, initial encounter: Secondary | ICD-10-CM | POA: Insufficient documentation

## 2022-03-08 LAB — CBC WITH DIFFERENTIAL/PLATELET
Abs Immature Granulocytes: 0.03 10*3/uL (ref 0.00–0.07)
Basophils Absolute: 0.1 10*3/uL (ref 0.0–0.1)
Basophils Relative: 0 %
Eosinophils Absolute: 0.1 10*3/uL (ref 0.0–0.5)
Eosinophils Relative: 1 %
HCT: 41 % (ref 36.0–46.0)
Hemoglobin: 13.6 g/dL (ref 12.0–15.0)
Immature Granulocytes: 0 %
Lymphocytes Relative: 25 %
Lymphs Abs: 3 10*3/uL (ref 0.7–4.0)
MCH: 30.1 pg (ref 26.0–34.0)
MCHC: 33.2 g/dL (ref 30.0–36.0)
MCV: 90.7 fL (ref 80.0–100.0)
Monocytes Absolute: 0.8 10*3/uL (ref 0.1–1.0)
Monocytes Relative: 7 %
Neutro Abs: 7.9 10*3/uL — ABNORMAL HIGH (ref 1.7–7.7)
Neutrophils Relative %: 67 %
Platelets: 309 10*3/uL (ref 150–400)
RBC: 4.52 MIL/uL (ref 3.87–5.11)
RDW: 14.5 % (ref 11.5–15.5)
WBC: 12 10*3/uL — ABNORMAL HIGH (ref 4.0–10.5)
nRBC: 0 % (ref 0.0–0.2)

## 2022-03-08 MED ORDER — AMOXICILLIN-POT CLAVULANATE 875-125 MG PO TABS
1.0000 | ORAL_TABLET | Freq: Once | ORAL | Status: AC
  Administered 2022-03-08: 1 via ORAL
  Filled 2022-03-08: qty 1

## 2022-03-08 MED ORDER — IBUPROFEN 800 MG PO TABS
800.0000 mg | ORAL_TABLET | Freq: Once | ORAL | Status: AC
  Administered 2022-03-08: 800 mg via ORAL
  Filled 2022-03-08: qty 1
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
## 2022-03-09 ENCOUNTER — Emergency Department
Admission: EM | Admit: 2022-03-09 | Discharge: 2022-03-09 | Disposition: A | Discharge status: Home / Self Care | Payer: Medicaid Other | Attending: Emergency Medicine | Admitting: Emergency Medicine

## 2022-03-09 ENCOUNTER — Other Ambulatory Visit: Payer: Self-pay

## 2022-03-09 ENCOUNTER — Emergency Department: Payer: Medicaid Other

## 2022-03-09 DIAGNOSIS — W503XXA Accidental bite by another person, initial encounter: Secondary | ICD-10-CM

## 2022-03-09 LAB — COMPREHENSIVE METABOLIC PANEL
ALT: 11 U/L (ref 0–44)
AST: 17 U/L (ref 15–41)
Albumin: 4.6 g/dL (ref 3.5–5.0)
Alkaline Phosphatase: 60 U/L (ref 38–126)
Anion gap: 10 (ref 5–15)
BUN: 14 mg/dL (ref 6–20)
CO2: 20 mmol/L — ABNORMAL LOW (ref 22–32)
Calcium: 9.4 mg/dL (ref 8.9–10.3)
Chloride: 107 mmol/L (ref 98–111)
Creatinine, Ser: 0.91 mg/dL (ref 0.44–1.00)
GFR, Estimated: >60 mL/min (ref >60)
Glucose, Bld: 103 mg/dL — ABNORMAL HIGH (ref 70–99)
Potassium: 3.7 mmol/L (ref 3.5–5.1)
Sodium: 137 mmol/L (ref 135–145)
Total Bilirubin: 0.8 mg/dL (ref 0.3–1.2)
Total Protein: 7.8 g/dL (ref 6.5–8.1)

## 2022-03-09 LAB — HIV ANTIBODY (ROUTINE TESTING W REFLEX): HIV Screen 4th Generation wRfx: NONREACTIVE

## 2022-03-09 MED ORDER — ONDANSETRON 4 MG PO TBDP
4.0000 mg | ORAL_TABLET | Freq: Once | ORAL | Status: AC
  Administered 2022-03-09: 4 mg via ORAL
  Filled 2022-03-09: qty 1

## 2022-03-09 MED ORDER — ONDANSETRON 4 MG PO TBDP
4.0000 mg | ORAL_TABLET | Freq: Four times a day (QID) | ORAL | 0 refills | Status: DC | PRN
  Filled 2022-03-09: qty 10, 3d supply, fill #0

## 2022-03-09 MED ORDER — OXYCODONE-ACETAMINOPHEN 5-325 MG PO TABS
1.0000 | ORAL_TABLET | Freq: Once | ORAL | Status: AC
  Administered 2022-03-09: 1 via ORAL
  Filled 2022-03-09: qty 1

## 2022-03-09 MED ORDER — AMOXICILLIN-POT CLAVULANATE 875-125 MG PO TABS: 1.0000 | ORAL_TABLET | Freq: Two times a day (BID) | ORAL | 0 refills | Status: DC

## 2022-03-09 MED ORDER — HYDROCODONE-ACETAMINOPHEN 5-325 MG PO TABS: 1.0000 | ORAL_TABLET | Freq: Four times a day (QID) | ORAL | 0 refills | Status: DC | PRN

## 2022-03-09 MED ORDER — ONDANSETRON 4 MG PO TBDP: 4.0000 mg | ORAL_TABLET | Freq: Four times a day (QID) | ORAL | 0 refills | Status: DC | PRN

## 2022-03-09 MED ORDER — TETANUS-DIPHTH-ACELL PERTUSSIS 5-2.5-18.5 LF-MCG/0.5 IM SUSY
0.5000 mL | PREFILLED_SYRINGE | Freq: Once | INTRAMUSCULAR | Status: AC
  Administered 2022-03-09: 0.5 mL via INTRAMUSCULAR
  Filled 2022-03-09: qty 0.5

## 2022-03-09 MED ORDER — AMOXICILLIN-POT CLAVULANATE 875-125 MG PO TABS
1.0000 | ORAL_TABLET | Freq: Two times a day (BID) | ORAL | 0 refills | Status: DC
  Filled 2022-03-09: qty 14, 7d supply, fill #0
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
## 2022-03-10 ENCOUNTER — Other Ambulatory Visit: Payer: Self-pay
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
## 2022-03-23 ENCOUNTER — Other Ambulatory Visit: Payer: Self-pay

## 2022-05-20 NOTE — Congregational Nurse Program (Signed)
  Dept: [phone]   Congregational Nurse Program Note  Date of Encounter: 05/20/2022 Client to [facility] Day center with request for evaluation of her left ankle. She reports falling "around Christmas time" and twisting it. She reports it still gives her some pain. Left ankle observed, no bruising or redness noted, some trace edema around the ankle bone noted, tender to touch. Client declined going to urgent care or [facility] ED for an xray, she is able to walk on it. Menthol pain relief applied to the area. Ankle supported with stretch wrap. Client satisfied with care provided. She was unable to stay at the center long enough for RN to assist with finding her a new PCP as she now has Well care Medicaid. Will attempt again next time client comes to the center. Past Medical History: Past Medical History:  Diagnosis Date   Asthma    Carpal tunnel syndrome    DVT of lower extremity (deep venous thrombosis) (HCC)    Factor 5 Leiden mutation, heterozygous ([place])    Migraine    Seizures ([place])     Encounter Details:  CNP Questionnaire - 05/20/22 1153       Questionnaire   Ask client: Do you give verbal consent for me to treat you today? Yes    Student Assistance N/A    Location Patient [place]    Visit Setting with Client Organization    Patient Status Unhoused   maybe in a "new place" today   Insurance Medicaid   wellcare   Insurance/Financial Assistance Referral N/A    Medication N/A    Medical Provider No   Client unable to stay to talk about finding a new PCP   Screening Referrals Made N/A    Medical Referrals Made N/A    Medical Appointment Made N/A    Recently w/o PCP, now 1st time PCP visit completed due to CNs referral or appointment made [place] Insecurities    Transportation N/A   client uses the [place] bus system   Housing/Utilities N/A    Interpersonal Safety N/A    Interventions Advocate/Support;Educate    Abnormal to Normal Screening Since  Last CN Visit N/A    Screenings CN Performed N/A    Sent Client to Lab for: N/A    Did client attend any of the following based off CNs referral or appointments made? N/A    ED Visit Averted N/A    Life-Saving Intervention Made N/A

## 2022-08-17 ENCOUNTER — Emergency Department: Payer: Medicaid Other

## 2022-08-17 ENCOUNTER — Other Ambulatory Visit: Payer: Self-pay

## 2022-08-17 ENCOUNTER — Encounter: Payer: Self-pay | Admitting: Internal Medicine

## 2022-08-17 ENCOUNTER — Inpatient Hospital Stay
Admission: EM | Admit: 2022-08-17 | Discharge: 2022-08-23 | DRG: 300 | Disposition: A | Discharge status: Home / Self Care | Payer: Medicaid Other | Attending: Internal Medicine | Admitting: Internal Medicine

## 2022-08-17 DIAGNOSIS — I82433 Acute embolism and thrombosis of popliteal vein, bilateral: Principal | ICD-10-CM | POA: Diagnosis present

## 2022-08-17 DIAGNOSIS — K029 Dental caries, unspecified: Secondary | ICD-10-CM | POA: Diagnosis present

## 2022-08-17 DIAGNOSIS — I824Y3 Acute embolism and thrombosis of unspecified deep veins of proximal lower extremity, bilateral: Secondary | ICD-10-CM | POA: Diagnosis not present

## 2022-08-17 DIAGNOSIS — I82441 Acute embolism and thrombosis of right tibial vein: Secondary | ICD-10-CM | POA: Diagnosis present

## 2022-08-17 DIAGNOSIS — F419 Anxiety disorder, unspecified: Secondary | ICD-10-CM | POA: Diagnosis present

## 2022-08-17 DIAGNOSIS — Z7901 Long term (current) use of anticoagulants: Secondary | ICD-10-CM

## 2022-08-17 DIAGNOSIS — I82409 Acute embolism and thrombosis of unspecified deep veins of unspecified lower extremity: Secondary | ICD-10-CM | POA: Diagnosis present

## 2022-08-17 DIAGNOSIS — Z5900 Homelessness unspecified: Secondary | ICD-10-CM

## 2022-08-17 DIAGNOSIS — Z751 Person awaiting admission to adequate facility elsewhere: Secondary | ICD-10-CM

## 2022-08-17 DIAGNOSIS — I82451 Acute embolism and thrombosis of right peroneal vein: Secondary | ICD-10-CM | POA: Diagnosis present

## 2022-08-17 DIAGNOSIS — F32A Depression, unspecified: Secondary | ICD-10-CM | POA: Diagnosis present

## 2022-08-17 DIAGNOSIS — Z832 Family history of diseases of the blood and blood-forming organs and certain disorders involving the immune mechanism: Secondary | ICD-10-CM

## 2022-08-17 DIAGNOSIS — R531 Weakness: Secondary | ICD-10-CM

## 2022-08-17 DIAGNOSIS — R262 Difficulty in walking, not elsewhere classified: Secondary | ICD-10-CM | POA: Diagnosis present

## 2022-08-17 DIAGNOSIS — R569 Unspecified convulsions: Secondary | ICD-10-CM

## 2022-08-17 DIAGNOSIS — Z888 Allergy status to other drugs, medicaments and biological substances status: Secondary | ICD-10-CM

## 2022-08-17 DIAGNOSIS — K047 Periapical abscess without sinus: Secondary | ICD-10-CM | POA: Insufficient documentation

## 2022-08-17 DIAGNOSIS — G40909 Epilepsy, unspecified, not intractable, without status epilepticus: Secondary | ICD-10-CM | POA: Diagnosis present

## 2022-08-17 DIAGNOSIS — D649 Anemia, unspecified: Secondary | ICD-10-CM | POA: Insufficient documentation

## 2022-08-17 DIAGNOSIS — E669 Obesity, unspecified: Secondary | ICD-10-CM | POA: Diagnosis present

## 2022-08-17 DIAGNOSIS — I82403 Acute embolism and thrombosis of unspecified deep veins of lower extremity, bilateral: Secondary | ICD-10-CM

## 2022-08-17 DIAGNOSIS — D6851 Activated protein C resistance: Secondary | ICD-10-CM | POA: Diagnosis present

## 2022-08-17 DIAGNOSIS — I82412 Acute embolism and thrombosis of left femoral vein: Secondary | ICD-10-CM | POA: Diagnosis present

## 2022-08-17 DIAGNOSIS — Z720 Tobacco use: Secondary | ICD-10-CM | POA: Diagnosis present

## 2022-08-17 LAB — BASIC METABOLIC PANEL
Anion gap: 7 (ref 5–15)
BUN: 14 mg/dL (ref 6–20)
CO2: 25 mmol/L (ref 22–32)
Calcium: 8.5 mg/dL — ABNORMAL LOW (ref 8.9–10.3)
Chloride: 104 mmol/L (ref 98–111)
Creatinine, Ser: 0.94 mg/dL (ref 0.44–1.00)
GFR, Estimated: >60 mL/min (ref >60)
Glucose, Bld: 120 mg/dL — ABNORMAL HIGH (ref 70–99)
Potassium: 3.8 mmol/L (ref 3.5–5.1)
Sodium: 136 mmol/L (ref 135–145)

## 2022-08-17 LAB — CBC WITH DIFFERENTIAL/PLATELET
Abs Immature Granulocytes: 0.03 10*3/uL (ref 0.00–0.07)
Basophils Absolute: 0 10*3/uL (ref 0.0–0.1)
Basophils Relative: 1 %
Eosinophils Absolute: 0.3 10*3/uL (ref 0.0–0.5)
Eosinophils Relative: 3 %
HCT: 35.7 % — ABNORMAL LOW (ref 36.0–46.0)
Hemoglobin: 11.6 g/dL — ABNORMAL LOW (ref 12.0–15.0)
Immature Granulocytes: 0 %
Lymphocytes Relative: 20 %
Lymphs Abs: 1.7 10*3/uL (ref 0.7–4.0)
MCH: 29.3 pg (ref 26.0–34.0)
MCHC: 32.5 g/dL (ref 30.0–36.0)
MCV: 90.2 fL (ref 80.0–100.0)
Monocytes Absolute: 0.6 10*3/uL (ref 0.1–1.0)
Monocytes Relative: 7 %
Neutro Abs: 5.9 10*3/uL (ref 1.7–7.7)
Neutrophils Relative %: 69 %
Platelets: 265 10*3/uL (ref 150–400)
RBC: 3.96 MIL/uL (ref 3.87–5.11)
RDW: 14.5 % (ref 11.5–15.5)
WBC: 8.5 10*3/uL (ref 4.0–10.5)
nRBC: 0 % (ref 0.0–0.2)

## 2022-08-17 LAB — HEPATIC FUNCTION PANEL
ALT: 11 U/L (ref 0–44)
AST: 20 U/L (ref 15–41)
Albumin: 3.4 g/dL — ABNORMAL LOW (ref 3.5–5.0)
Alkaline Phosphatase: 70 U/L (ref 38–126)
Bilirubin, Direct: <0.1 mg/dL (ref 0.0–0.2)
Total Bilirubin: 0.6 mg/dL (ref 0.3–1.2)
Total Protein: 6.1 g/dL — ABNORMAL LOW (ref 6.5–8.1)

## 2022-08-17 LAB — PROTIME-INR
INR: 1 (ref 0.8–1.2)
Prothrombin Time: 13.2 seconds (ref 11.4–15.2)

## 2022-08-17 LAB — APTT: aPTT: 23 seconds — ABNORMAL LOW (ref 24–36)

## 2022-08-17 MED ORDER — AMOXICILLIN-POT CLAVULANATE 875-125 MG PO TABS
1.0000 | ORAL_TABLET | Freq: Two times a day (BID) | ORAL | Status: DC
  Administered 2022-08-17 – 2022-08-23 (×12): 1 via ORAL
  Filled 2022-08-17 (×12): qty 1

## 2022-08-17 MED ORDER — NICOTINE 21 MG/24HR TD PT24
21.0000 mg | MEDICATED_PATCH | Freq: Every day | TRANSDERMAL | Status: DC
  Administered 2022-08-18 – 2022-08-22 (×6): 21 mg via TRANSDERMAL
  Filled 2022-08-17 (×7): qty 1

## 2022-08-17 MED ORDER — HYDROCODONE-ACETAMINOPHEN 5-325 MG PO TABS
1.0000 | ORAL_TABLET | ORAL | Status: DC | PRN
  Administered 2022-08-18 – 2022-08-23 (×15): 1 via ORAL
  Filled 2022-08-17 (×16): qty 1

## 2022-08-17 MED ORDER — SODIUM CHLORIDE 0.9 % IV SOLN: INTRAVENOUS | Status: AC

## 2022-08-17 MED ORDER — ENOXAPARIN SODIUM 100 MG/ML IJ SOSY
1.0000 mg/kg | PREFILLED_SYRINGE | Freq: Two times a day (BID) | INTRAMUSCULAR | Status: DC
  Administered 2022-08-17 – 2022-08-18 (×2): 100 mg via SUBCUTANEOUS
  Filled 2022-08-17 (×3): qty 1

## 2022-08-17 MED ORDER — ACETAMINOPHEN 650 MG RE SUPP: 650.0000 mg | Freq: Four times a day (QID) | RECTAL | Status: DC | PRN

## 2022-08-17 MED ORDER — OXYCODONE-ACETAMINOPHEN 5-325 MG PO TABS
1.0000 | ORAL_TABLET | Freq: Once | ORAL | Status: AC
  Administered 2022-08-17: 1 via ORAL
  Filled 2022-08-17: qty 1

## 2022-08-17 MED ORDER — SODIUM CHLORIDE 0.9% FLUSH
3.0000 mL | Freq: Two times a day (BID) | INTRAVENOUS | Status: DC
  Administered 2022-08-18 – 2022-08-23 (×12): 3 mL via INTRAVENOUS

## 2022-08-17 MED ORDER — MORPHINE SULFATE (PF) 2 MG/ML IV SOLN
2.0000 mg | INTRAVENOUS | Status: DC | PRN
  Administered 2022-08-18 – 2022-08-22 (×14): 2 mg via INTRAVENOUS
  Filled 2022-08-17 (×15): qty 1

## 2022-08-17 MED ORDER — ACETAMINOPHEN 325 MG PO TABS
650.0000 mg | ORAL_TABLET | Freq: Four times a day (QID) | ORAL | Status: DC | PRN
  Administered 2022-08-19: 650 mg via ORAL
  Filled 2022-08-17: qty 2

## 2022-08-17 NOTE — H&P (Incomplete)
History and Physical     PatientSERRITA DEACY Ashley:[id number] DOB: 1986-01-01 DOA: 08/17/2022 DOS: the patient was seen and examined on 08/18/2022 PCP: Patient, No Pcp Per   Patient coming from:  Homeless  Chief Complaint: Leg swelling HISTORY OF PRESENT ILLNESS: Brianna Ashley is an 37 y.o. female seen in the emergency room with complaints of bilateral leg swelling. Admission requested for bilateral DVTs with right-sided being occlusive. Ultrasound report as follows: 1. Occlusive DVT within the distal RIGHT popliteal vein, RIGHT posterior tibial vein and RIGHT peroneal vein. 2. Limited evaluation of the LEFT posterior tibial vein and LEFT peroneal vein with nonocclusive DVT within the LEFT femoral vein and LEFT popliteal vein. Patient does not report fevers chills nausea vomiting abdominal pain bleeding. Patient does state that she has a history of epilepsy since the age of 47 and she is currently not on any medications for that.  States that her mom has factor V Leiden and so to her children. Per EDMD patient reported dental pain and perirectal discharge.  Which the patient did not report to me in the ED patient got Augmentin for her dental pain and GU exam by ED providers revealed Normal findings and no perirectal discharge or swelling or fluctuance. Augmentin for tooth infection as patient reported tooth pain.   Past Medical History:  Diagnosis Date  . Asthma   . Carpal tunnel syndrome   . DVT of lower extremity (deep venous thrombosis) (HCC)   . Factor 5 Leiden mutation, heterozygous (HCC)   . Migraine   . Seizures (HCC)    Review of Systems  Cardiovascular:  Positive for leg swelling.  Neurological:  Positive for seizures.  All other systems reviewed and are negative.  Allergies  Allergen Reactions  . Ambien [Zolpidem Tartrate] Other (See Comments)    death  . Ambien [Zolpidem Tartrate]   . Clindamycin/Lincomycin Nausea And Vomiting  . Keflex [Cephalexin]  Nausea And Vomiting  . Methylphenidate Other (See Comments)    "I pretty much died in my dad's arms"  . Ritalin [Methylphenidate Hcl] Other (See Comments)    death  . Ritalin [Methylphenidate Hcl]   . Zolpidem Other (See Comments)    "I pretty much died a few times"   Past Surgical History:  Procedure Laterality Date  . ABSCESS DRAINAGE    . CESAREAN SECTION    . DILATION AND CURETTAGE OF UTERUS    . INCISION AND DRAINAGE PERIRECTAL ABSCESS Right 09/18/2021   Procedure: IRRIGATION AND DEBRIDEMENT PERIRECTAL ABSCESS;  Surgeon: Campbell Lerner, MD;  Location: [facility] ORS;  Service: General;  Laterality: Right;  . TUBAL LIGATION  "2010"   MEDICATIONS: (Not in an outpatient encounter)    Current Facility-Administered Medications:  .  0.9 %  sodium chloride infusion, , Intravenous, Continuous, Irena Cords V, MD .  acetaminophen (TYLENOL) tablet 650 mg, 650 mg, Oral, Q6H PRN **OR** acetaminophen (TYLENOL) suppository 650 mg, 650 mg, Rectal, Q6H PRN, Gertha Calkin, MD .  amoxicillin-clavulanate (AUGMENTIN) 875-125 MG per tablet 1 tablet, 1 tablet, Oral, BID, Dionne Bucy, MD, 1 tablet at 08/17/22 2323 .  enoxaparin (LOVENOX) injection 100 mg, 1 mg/kg, Subcutaneous, Q12H, Barrie Folk, RPH, 100 mg at 08/17/22 2323 .  HYDROcodone-acetaminophen (NORCO/VICODIN) 5-325 MG per tablet 1 tablet, 1 tablet, Oral, Q4H PRN, Allena Katz, Kejon Feild V, MD .  morphine (PF) 2 MG/ML injection 2 mg, 2 mg, Intravenous, Q4H PRN, Irena Cords V, MD .  nicotine (NICODERM CQ - dosed in mg/24 hours) patch  21 mg, 21 mg, Transdermal, Daily, Cerrone Debold V, MD .  sodium chloride flush (NS) 0.9 % injection 3 mL, 3 mL, Intravenous, Q12H, Gertha Calkin, MD  Current Outpatient Medications:  .  amoxicillin-clavulanate (AUGMENTIN) 875-125 MG tablet, Take 1 tablet by mouth 2 (two) times daily. (Patient not taking: Reported on 08/17/2022), Disp: 14 tablet, Rfl: 0 .  apixaban (ELIQUIS) 5 MG TABS tablet, Take 1 tablet (5 mg total)  by mouth 2 (two) times daily., Disp: 60 tablet, Rfl: 1 .  HYDROcodone-acetaminophen (NORCO/VICODIN) 5-325 MG tablet, Take 1 tablet by mouth every 6 (six) hours as needed., Disp: 8 tablet, Rfl: 0 .  ondansetron (ZOFRAN-ODT) 4 MG disintegrating tablet, Take 1 tablet (4 mg total) by mouth every 6 (six) hours as needed for nausea or vomiting., Disp: 10 tablet, Rfl: 0    ED Course: Pt in Ed is alert/awake oriented and eating when I saw her in ed. Pt did not report any dental issues to me or any swallowing issue in ed.  Vitals:   08/17/22 1905 08/17/22 2116 08/17/22 2325  BP: 136/68  (!) 119/58  Pulse: 88  66  Resp: 18  18  Temp: 98.9 F (37.2 C)  98.7 F (37.1 C)  TempSrc:   Oral  SpO2: 99%  97%  Weight:  99.8 kg    No intake/output data recorded. SpO2: 97 % Blood work in ed shows: CMP shows glucose 120. And normal kidney / LFT. CBC is normal.  USG of lower extremity shows: 1. Occlusive DVT within the distal RIGHT popliteal vein, RIGHT posterior tibial vein and RIGHT peroneal vein. 2. Limited evaluation of the LEFT posterior tibial vein and LEFT peroneal vein with nonocclusive DVT within the LEFT femoral vein and LEFT popliteal vein. Pt started on Lovenox in ed per pharmacy consult.  Results for orders placed or performed during the hospital encounter of 08/17/22 (from the past 72 hour(s))  CBC with Differential     Status: Abnormal   Collection Time: 08/17/22  7:12 PM  Result Value Ref Range   WBC 8.5 4.0 - 10.5 K/uL   RBC 3.96 3.87 - 5.11 MIL/uL   Hemoglobin 11.6 (L) 12.0 - 15.0 g/dL   HCT 16.1 (L) 09.6 - 04.5 %   MCV 90.2 80.0 - 100.0 fL   MCH 29.3 26.0 - 34.0 pg   MCHC 32.5 30.0 - 36.0 g/dL   RDW 40.9 81.1 - 91.4 %   Platelets 265 150 - 400 K/uL   nRBC 0.0 0.0 - 0.2 %   Neutrophils Relative % 69 %   Neutro Abs 5.9 1.7 - 7.7 K/uL   Lymphocytes Relative 20 %   Lymphs Abs 1.7 0.7 - 4.0 K/uL   Monocytes Relative 7 %   Monocytes Absolute 0.6 0.1 - 1.0 K/uL   Eosinophils  Relative 3 %   Eosinophils Absolute 0.3 0.0 - 0.5 K/uL   Basophils Relative 1 %   Basophils Absolute 0.0 0.0 - 0.1 K/uL   Immature Granulocytes 0 %   Abs Immature Granulocytes 0.03 0.00 - 0.07 K/uL    Comment: Performed at [facility], [address] [place], [place] [address]  Basic metabolic panel     Status: Abnormal   Collection Time: 08/17/22  7:12 PM  Result Value Ref Range   Sodium 136 135 - 145 mmol/L   Potassium 3.8 3.5 - 5.1 mmol/L   Chloride 104 98 - 111 mmol/L   CO2 25 22 - 32 mmol/L  Glucose, Bld 120 (H) 70 - 99 mg/dL    Comment: Glucose reference range applies only to samples taken after fasting for at least 8 hours.   BUN 14 6 - 20 mg/dL   Creatinine, Ser 1.61 0.44 - 1.00 mg/dL   Calcium 8.5 (L) 8.9 - 10.3 mg/dL   GFR, Estimated >09 >60 mL/min    Comment: (NOTE) Calculated using the CKD-EPI Creatinine Equation (2021)    Anion gap 7 5 - 15    Comment: Performed at [facility], [address] [place], [place] [address]  Hepatic function panel     Status: Abnormal   Collection Time: 08/17/22  7:12 PM  Result Value Ref Range   Total Protein 6.1 (L) 6.5 - 8.1 g/dL   Albumin 3.4 (L) 3.5 - 5.0 g/dL   AST 20 15 - 41 U/L   ALT 11 0 - 44 U/L   Alkaline Phosphatase 70 38 - 126 U/L   Total Bilirubin 0.6 0.3 - 1.2 mg/dL   Bilirubin, Direct <8.1 0.0 - 0.2 mg/dL   Indirect Bilirubin NOT CALCULATED 0.3 - 0.9 mg/dL    Comment: Performed at [facility], [address] [place], [place] [address]  Protime-INR     Status: None   Collection Time: 08/17/22 11:10 PM  Result Value Ref Range   Prothrombin Time 13.2 11.4 - 15.2 seconds   INR 1.0 0.8 - 1.2    Comment: (NOTE) INR goal varies based on device and disease states. Performed at [facility], [address] [place], [place] [address]   APTT     Status: Abnormal   Collection Time: 08/17/22 11:10 PM  Result Value Ref Range   aPTT 23 (L) 24 - 36 seconds    Comment: Performed  at [facility], [address] [place], [place] [address]    Lab Results  Component Value Date   CREATININE 0.94 08/17/2022   CREATININE 0.91 03/08/2022   CREATININE 1.08 (H) 02/02/2022      Latest Ref Rng & Units 08/17/2022    7:12 PM 03/08/2022   11:10 PM 02/02/2022   11:36 AM  CMP  Glucose 70 - 99 mg/dL 657  846  962   BUN 6 - 20 mg/dL 14  14  10    Creatinine 0.44 - 1.00 mg/dL 9.52  8.41  3.24   Sodium 135 - 145 mmol/L 136  137  135   Potassium 3.5 - 5.1 mmol/L 3.8  3.7  3.6   Chloride 98 - 111 mmol/L 104  107  107   CO2 22 - 32 mmol/L 25  20  23    Calcium 8.9 - 10.3 mg/dL 8.5  9.4  8.7   Total Protein 6.5 - 8.1 g/dL 6.1  7.8    Total Bilirubin 0.3 - 1.2 mg/dL 0.6  0.8    Alkaline Phos 38 - 126 U/L 70  60    AST 15 - 41 U/L 20  17    ALT 0 - 44 U/L 11  11      Unresulted Labs (From admission, onward)     Start     Ordered   08/18/22 0500  Comprehensive metabolic panel  Tomorrow morning,   R        08/17/22 2330   08/18/22 0500  CBC  Tomorrow morning,   R        08/17/22 2330   08/17/22 2333  Hemoglobin A1c  Add-on,   AD  08/17/22 2332           Pt has received : Orders Placed This Encounter  Procedures  . US Venous Img Lower Bilateral    Standing Status:   Standing    Number of Occurrences:   1    Order Specific Question:   Reason for Exam (SYMPTOM  OR DIAGNOSIS REQUIRED)    Answer:   r/o dvt  . CBC with Differential    Standing Status:   Standing    Number of Occurrences:   1  . Basic metabolic panel    Standing Status:   Standing    Number of Occurrences:   1  . Protime-INR    Standing Status:   Standing    Number of Occurrences:   1  . APTT    Standing Status:   Standing    Number of Occurrences:   1  . Hepatic function panel    Standing Status:   Standing    Number of Occurrences:   1  . Comprehensive metabolic panel    Standing Status:   Standing    Number of Occurrences:   1  . CBC    Standing Status:   Standing     Number of Occurrences:   1  . Hemoglobin A1c    Standing Status:   Standing    Number of Occurrences:   1  . Diet Heart Room service appropriate? Yes; Fluid consistency: Thin    Standing Status:   Standing    Number of Occurrences:   1    Order Specific Question:   Room service appropriate?    Answer:   Yes    Order Specific Question:   Fluid consistency:    Answer:   Thin  . Maintain IV access    Standing Status:   Standing    Number of Occurrences:   1  . Vital signs    Standing Status:   Standing    Number of Occurrences:   1  . Notify physician (specify)    Standing Status:   Standing    Number of Occurrences:   20    Order Specific Question:   Notify Physician    Answer:   for pulse less than 55 or greater than 120    Order Specific Question:   Notify Physician    Answer:   for respiratory rate less than 12 or greater than 25    Order Specific Question:   Notify Physician    Answer:   for temperature greater than 100.5 F    Order Specific Question:   Notify Physician    Answer:   for urinary output less than 30 mL/hr for four hours    Order Specific Question:   Notify Physician    Answer:   for systolic BP less than 90 or greater than 160, diastolic BP less than 60 or greater than 100    Order Specific Question:   Notify Physician    Answer:   for new hypoxia w/ oxygen saturations < 88%  . Progressive Mobility Protocol: No Restrictions    Standing Status:   Standing    Number of Occurrences:   1  . Daily weights    Standing Status:   Standing    Number of Occurrences:   1  . Intake and Output    Standing Status:   Standing    Number of Occurrences:   1  . Do not place and if  present remove PureWick    Standing Status:   Standing    Number of Occurrences:   1  . Initiate Oral Care Protocol    Standing Status:   Standing    Number of Occurrences:   1  . Initiate Carrier Fluid Protocol    Standing Status:   Standing    Number of Occurrences:   1  . RN may order  General Admission PRN Orders utilizing "General Admission PRN medications" (through manage orders) for the following patient needs: allergy symptoms (Claritin), cold sores (Carmex), cough (Robitussin DM), eye irritation (Liquifilm Tears), hemorrhoids (Tucks), indigestion (Maalox), minor skin irritation (Hydrocortisone Cream), muscle pain Romeo Apple Gay), nose irritation (saline nasal spray) and sore throat (Chloraseptic spray).    Standing Status:   Standing    Number of Occurrences:   [id number]  . Cardiac Monitoring Continuous x 48 hours Indications for use: Other; Other indications for use: DVT    Standing Status:   Standing    Number of Occurrences:   1    Order Specific Question:   Indications for use:    Answer:   Other    Order Specific Question:   Other indications for use:    Answer:   DVT  . Full code    Standing Status:   Standing    Number of Occurrences:   1    Order Specific Question:   By:    Answer:   Other  . ENOXAPARIN (LOVENOX) PER PHARMACY CONSULT    Standing Status:   Standing    Number of Occurrences:   1    Order Specific Question:   Indication:    Answer:   VTE Treatment  . Consult to hospitalist  5901    5901    Standing Status:   Standing    Number of Occurrences:   1    Order Specific Question:   Place call to:    Answer:   Hospitalist    Order Specific Question:   Reason for Consult    Answer:   Admit    Order Specific Question:   Diagnosis/Clinical Info for Consult:    Answer:   Bilat acute DVT, homeless, can't get meds  . Pulse oximetry check with vital signs    Standing Status:   Standing    Number of Occurrences:   1  . Oxygen therapy Mode or (Route): Nasal cannula; Liters Per Minute: 2; Keep 02 saturation: greater than 92 %    Standing Status:   Standing    Number of Occurrences:   20    Order Specific Question:   Mode or (Route)    Answer:   Nasal cannula    Order Specific Question:   Liters Per Minute    Answer:   2    Order Specific Question:   Keep 02  saturation    Answer:   greater than 92 %  . Place in observation (patient's expected length of stay will be less than 2 midnights)    Standing Status:   Standing    Number of Occurrences:   1    Order Specific Question:   Hospital Area    Answer:   [facility] [[id number]]    Order Specific Question:   Level of Care    Answer:   Telemetry Medical [104]    Order Specific Question:   Covid Evaluation    Answer:   Asymptomatic - no recent exposure (last 10 days) testing  not required    Order Specific Question:   Diagnosis    Answer:   DVT (deep venous thrombosis) [facility] [454098]    Order Specific Question:   Admitting Physician    Answer:   Darrold Junker    Order Specific Question:   Attending Physician    Answer:   Darrold Junker  . Seizure precautions    Standing Status:   Standing    Number of Occurrences:   1    Meds ordered this encounter  Medications  . oxyCODONE-acetaminophen (PERCOCET/ROXICET) 5-325 MG per tablet 1 tablet  . enoxaparin (LOVENOX) injection 100 mg  . amoxicillin-clavulanate (AUGMENTIN) 875-125 MG per tablet 1 tablet  . sodium chloride flush (NS) 0.9 % injection 3 mL  . 0.9 %  sodium chloride infusion  . OR Linked Order Group   . acetaminophen (TYLENOL) tablet 650 mg   . acetaminophen (TYLENOL) suppository 650 mg  . HYDROcodone-acetaminophen (NORCO/VICODIN) 5-325 MG per tablet 1 tablet  . morphine (PF) 2 MG/ML injection 2 mg  . nicotine (NICODERM CQ - dosed in mg/24 hours) patch 21 mg    Admission Imaging : US Venous Img Lower Bilateral  Result Date: 08/17/2022 CLINICAL DATA:  Bilateral lower extremity swelling. EXAM: BILATERAL LOWER EXTREMITY VENOUS DOPPLER ULTRASOUND TECHNIQUE: Gray-scale sonography with graded compression, as well as color Doppler and duplex ultrasound were performed to evaluate the lower extremity deep venous systems from the level of the common femoral vein and including the common femoral, femoral,  profunda femoral, popliteal and calf veins including the posterior tibial, peroneal and gastrocnemius veins when visible. The superficial great saphenous vein was also interrogated. Spectral Doppler was utilized to evaluate flow at rest and with distal augmentation maneuvers in the common femoral, femoral and popliteal veins. COMPARISON:  July 14, 2020 FINDINGS: RIGHT LOWER EXTREMITY Common Femoral Vein: No evidence of thrombus. Normal compressibility, respiratory phasicity and response to augmentation. Saphenofemoral Junction: No evidence of thrombus. Normal compressibility and flow on color Doppler imaging. Profunda Femoral Vein: No evidence of thrombus. Normal compressibility and flow on color Doppler imaging. Femoral Vein: No evidence of thrombus. Normal compressibility, respiratory phasicity and response to augmentation. Popliteal Vein: Evidence of distal occlusive thrombus with abnormal compressibility, respiratory phasicity and response to augmentation. Calf Veins: Evidence of occlusive thrombus with abnormal compressibility and flow on color Doppler imaging. Superficial Great Saphenous Vein: No evidence of thrombus. Normal compressibility. Venous Reflux:  None. Other Findings:  None. LEFT LOWER EXTREMITY Common Femoral Vein: No evidence of thrombus. Normal compressibility, respiratory phasicity and response to augmentation. Saphenofemoral Junction: No evidence of thrombus. Normal compressibility and flow on color Doppler imaging. Profunda Femoral Vein: No evidence of thrombus. Normal compressibility and flow on color Doppler imaging. Femoral Vein: Evidence of nonocclusive thrombus with abnormal compressibility, respiratory phasicity and response to augmentation. Popliteal Vein: Evidence of nonocclusive thrombus with abnormal compressibility, respiratory phasicity and response to augmentation. Calf Veins: The LEFT posterior tibial vein and LEFT peroneal vein are poorly visualized. Superficial Great Saphenous  Vein: No evidence of thrombus. Normal compressibility. Venous Reflux:  None. Other Findings:  None. IMPRESSION: 1. Occlusive DVT within the distal RIGHT popliteal vein, RIGHT posterior tibial vein and RIGHT peroneal vein. 2. Limited evaluation of the LEFT posterior tibial vein and LEFT peroneal vein with nonocclusive DVT within the LEFT femoral vein and LEFT popliteal vein. Electronically Signed   By: Aram Candela M.D.   On: 08/17/2022 20:11    Physical Examination: Vitals:   08/17/22 1905  08/17/22 2116 08/17/22 2325  BP: 136/68  (!) 119/58  Pulse: 88  66  Temp: 98.9 F (37.2 C)  98.7 F (37.1 C)  Resp: 18  18  Weight:  99.8 kg   SpO2: 99%  97%  TempSrc:   Oral   Physical Exam Vitals and nursing note reviewed.  Constitutional:      General: She is not in acute distress. HENT:     Head: Normocephalic and atraumatic.     Right Ear: Hearing normal.     Left Ear: Hearing normal.     Nose: Nose normal. No nasal deformity.     Mouth/Throat:     Lips: Pink.     Tongue: No lesions.     Pharynx: Oropharynx is clear.  Eyes:     General: Lids are normal.     Extraocular Movements: Extraocular movements intact.  Cardiovascular:     Rate and Rhythm: Normal rate and regular rhythm.     Heart sounds: Normal heart sounds.  Pulmonary:     Effort: Pulmonary effort is normal.     Breath sounds: Normal breath sounds.  Abdominal:     General: Bowel sounds are normal. There is no distension.     Palpations: Abdomen is soft. There is no mass.     Tenderness: There is no abdominal tenderness.  Musculoskeletal:     Right lower leg: Edema present.     Left lower leg: Edema present.  Skin:    General: Skin is warm.  Neurological:     General: No focal deficit present.     Mental Status: She is alert and oriented to person, place, and time.     Cranial Nerves: Cranial nerves 2-12 are intact.  Psychiatric:        Attention and Perception: Attention normal.        Mood and Affect: Mood  normal.        Speech: Speech normal.        Behavior: Behavior normal. Behavior is cooperative.     Assessment and Plan: * DVT (deep venous thrombosis) (HCC) 2/2 Factor v Leiden.  Cont Lovenox. Vascular consult per AM team.    Seizures (HCC) Seizure precautions.  No AED in chart.    Tobacco abuse Nicotine patch, \  Factor V Leiden (HCC) Pt states she is at risk for blood clots.  Cannot afford eliquis.  CM consult for eliquis from patient assistance.         DVT, lower extremity, recurrent, bilateral (HCC)  Dental infection  Other orders -     CBC with Differential/Platelet; Standing -     Basic metabolic panel; Standing -     US Venous Img Lower Bilateral (DVT); Standing -     enoxaparin (LOVENOX) per pharmacy consult; Standing -     Protime-INR; Standing -     APTT; Standing -     oxyCODONE-Acetaminophen -     Enoxaparin Sodium -     Consult to hospitalist; Standing -     Amoxicillin-Pot Clavulanate -     Hepatic function panel; Standing -     On Pharmacologic VTE Prophylaxis  (no mechanical VTE prophylaxis ordered); Standing -     Place in observation (patient's expected length of stay will be less than 2 midnights); Standing -     Maintain IV access; Standing -     sodium chloride flush -     Vital signs; Standing -     Pulse oximetry (single);  Standing -     Notify physician (specify); Standing -     Progressive Mobility Protocol: No Restrictions; Standing -     Weigh patient; Standing -     Intake and output; Standing -     Do not place and if present remove PureWick; Standing -     Initiate Oral Care Protocol; Standing -     Initiate Carrier Fluid Protocol; Standing -     Oxygen therapy; Standing -     Comprehensive metabolic panel; Standing -     CBC; Standing -     Sodium Chloride -     Acetaminophen -     Acetaminophen -     HYDROcodone-Acetaminophen -     Morphine Sulfate (PF) -     RN may order General Admission PRN Orders utilizing  "General Admission PRN medications" (through manage orders) for the following patient needs:; Standing -     On Full Dose Anticoagulation  (satisfies pharmacologic VTE prophylaxis requirement); Standing -     Full code; Standing -     Cardiac Monitoring; Standing -     Diet Heart; Standing -     Nicotine -     Seizure precautions; Standing -     Hemoglobin A1c; Standing    DVT prophylaxis:  ***  Code Status:  ***     03/08/2022   11:06 PM  Advanced Directives  Does Patient Have a Medical Advance Directive? No   Family Communication:  *** Emergency Contact: Contact Information     Name Relation Home Work Mobile   [place] Friend   [phone]      Disposition Plan:  ***  Consults: *** Admission status: ***  Unit / Expected LOS: *** Gertha Calkin MD [facility]  6 PM- 2 AM. [phone] Please use [email or web address] OR call [facility] Admits & Consults @ [phone] to contact current Assigned [facility] Attending/Consulting MD for this patient.

## 2022-08-17 NOTE — ED Provider Notes (Signed)
[facility] Provider Note    Event Date/Time   First MD Initiated Contact with Patient 08/17/22 2010     (approximate)   History   Leg Swelling (BLE 3 days), Dental Pain, and Abscess   HPI  Brianna Ashley is a 37 y.o. female with history of factor V Leiden, DVT, seizure disorder, asthma, migraines, obesity, and perirectal abscess who presents with multiple complaints.  Leg swelling: The patient reports bilateral leg swelling acutely over the last week.  She has had DVTs before and was previous on Eliquis.  She is currently homeless, has no primary care provider, and has been off Eliquis for some time.  She denies any acute trauma.  She has no chest pain or shortness of breath.  Oral pain: The patient has an area of pain and swelling to the gums just above the left upper front teeth and going up to the lip.  She is concerned she has an abscess there.  The pain has developed over the last few days.  She has tooth decay in this area.  Postoperative drainage: The patient reports recent liquid drainage from the site of her previous perirectal abscess surgery.  I reviewed the past medical records.  The patient was most recent admitted to the surgical service and July 2023 for perirectal abscess treated with surgery.  Physical Exam   Triage Vital Signs: ED Triage Vitals [08/17/22 1905]  Enc Vitals Group     BP 136/68     Pulse Rate 88     Resp 18     Temp 98.9 F (37.2 C)     Temp src      SpO2 99 %     Weight      Height      Head Circumference      Peak Flow      Pain Score 8     Pain Loc      Pain Edu?      Excl. in GC?     Most recent vital signs: Vitals:   08/17/22 1905 08/17/22 2325  BP: 136/68 (!) 119/58  Pulse: 88 66  Resp: 18 18  Temp: 98.9 F (37.2 C) 98.7 F (37.1 C)  SpO2: 99% 97%     General: Awake, no distress.  CV:  Good peripheral perfusion.  Resp:  Normal effort.  Abd:  No distention.  Other:  Bilateral nonpitting  lower extremity edema.  Mild diffuse tenderness to the lower legs.  No erythema, induration, or abnormal warmth.  Left upper gums adjacent to upper front teeth with tenderness but no fluctuance or palpable abscess that is drainable.  No visible external swelling.  No palpable perirectal fluctuance or abscess.  No drainage expressed.  No erythema, induration, or abnormal warmth.   ED Results / Procedures / Treatments   Labs (all labs ordered are listed, but only abnormal results are displayed) Labs Reviewed  CBC WITH DIFFERENTIAL/PLATELET - Abnormal; Notable for the following components:      Result Value   Hemoglobin 11.6 (*)    HCT 35.7 (*)    All other components within normal limits  BASIC METABOLIC PANEL - Abnormal; Notable for the following components:   Glucose, Bld 120 (*)    Calcium 8.5 (*)    All other components within normal limits  APTT - Abnormal; Notable for the following components:   aPTT 23 (*)    All other components within normal limits  HEPATIC FUNCTION PANEL -  Abnormal; Notable for the following components:   Total Protein 6.1 (*)    Albumin 3.4 (*)    All other components within normal limits  PROTIME-INR  COMPREHENSIVE METABOLIC PANEL  CBC  HEMOGLOBIN A1C     EKG     RADIOLOGY  US venous LE bilateral:  IMPRESSION:  1. Occlusive DVT within the distal RIGHT popliteal vein, RIGHT  posterior tibial vein and RIGHT peroneal vein.  2. Limited evaluation of the LEFT posterior tibial vein and LEFT  peroneal vein with nonocclusive DVT within the LEFT femoral vein and  LEFT popliteal vein.    PROCEDURES:  Critical Care performed: No  Procedures   MEDICATIONS ORDERED IN ED: Medications  enoxaparin (LOVENOX) injection 100 mg (100 mg Subcutaneous Given 08/17/22 2323)  amoxicillin-clavulanate (AUGMENTIN) 875-125 MG per tablet 1 tablet (1 tablet Oral Given 08/17/22 2323)  sodium chloride flush (NS) 0.9 % injection 3 mL (has no administration in time  range)  0.9 %  sodium chloride infusion (has no administration in time range)  acetaminophen (TYLENOL) tablet 650 mg (has no administration in time range)    Or  acetaminophen (TYLENOL) suppository 650 mg (has no administration in time range)  HYDROcodone-acetaminophen (NORCO/VICODIN) 5-325 MG per tablet 1 tablet (has no administration in time range)  morphine (PF) 2 MG/ML injection 2 mg (has no administration in time range)  nicotine (NICODERM CQ - dosed in mg/24 hours) patch 21 mg (has no administration in time range)  oxyCODONE-acetaminophen (PERCOCET/ROXICET) 5-325 MG per tablet 1 tablet (1 tablet Oral Given 08/17/22 2112)     IMPRESSION / MDM / ASSESSMENT AND PLAN / ED COURSE  I reviewed the triage vital signs and the nursing notes.  37 year old female with PMH as noted above, currently homeless, presents with bilateral leg swelling, oral pain, and drainage from the site of a perirectal abscess.  Differential diagnosis includes, but is not limited to:  Leg swelling: Acute DVT, dependent edema, Baker's cyst.  There is no evidence of cellulitis.  We will obtain ultrasounds.  Oral pain: This is most consistent with a dental infection as the patient has adjacent very decayed teeth.  There is no palpable abscess that I can drain at this time.  Will give antibiotics.  Perirectal drainage: Exam reveals no evidence of abscess or acute infection.  There is no indication for imaging or further workup.  Patient's presentation is most consistent with acute presentation with potential threat to life or bodily function.  The patient is on the cardiac monitor to evaluate for evidence of arrhythmia and/or significant heart rate changes.   ----------------------------------------- 11:46 PM on 08/17/2022 -----------------------------------------  Ultrasound shows bilateral acute DVT.  The patient is homeless, currently has no way to get medication, and is not appropriate for discharge home with  p.o. Eliquis.  She will need admission for workup and treatment.  I have ordered Lovenox.  I also ordered Augmentin for the possible dental infection.  I consulted Dr. Allena Katz from the hospitalist service; based our discussion she agrees to admit the patient.    FINAL CLINICAL IMPRESSION(S) / ED DIAGNOSES   Final diagnoses:  DVT, lower extremity, recurrent, bilateral (HCC)  Dental infection     Rx / DC Orders   ED Discharge Orders     None        Note:  This document was prepared using Dragon voice recognition software and may include unintentional dictation errors.    Dionne Bucy, MD 08/17/22 [phone]

## 2022-08-17 NOTE — Progress Notes (Signed)
Read chart for report 

## 2022-08-17 NOTE — H&P (Incomplete)
History and Physical     PatientKERIN Ashley HYQ:[id number] DOB: [birth year] DOA: 08/17/2022 DOS: the Brianna Ashley was seen and examined on 08/18/2022 PCP: Brianna Ashley, No Pcp Per   Brianna Ashley coming from:  Homeless  Chief Complaint: Leg swelling HISTORY OF PRESENT ILLNESS: Brianna Ashley is an 37 y.o. female seen in the emergency room with complaints of bilateral leg swelling. Admission requested for bilateral DVTs with right-sided being occlusive. Ultrasound report as follows: 1. Occlusive DVT within the distal RIGHT popliteal vein, RIGHT posterior tibial vein and RIGHT peroneal vein. 2. Limited evaluation of the LEFT posterior tibial vein and LEFT peroneal vein with nonocclusive DVT within the LEFT femoral vein and LEFT popliteal vein. Brianna Ashley does not report fevers chills nausea vomiting abdominal pain bleeding. 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  States that her mom has factor V Leiden and so to her children. Per EDMD Brianna Ashley reported dental pain and perirectal discharge.  Which the Brianna Ashley did not report to me in the ED Brianna Ashley got Augmentin for her dental pain and GU exam by ED providers revealed Normal findings and no perirectal discharge or swelling or fluctuance. Augmentin for tooth infection as Brianna Ashley reported tooth pain.   Past Medical History:  Diagnosis Date   Asthma    Carpal tunnel syndrome    DVT of lower extremity (deep venous thrombosis) (HCC)    Factor 5 Leiden mutation, heterozygous (HCC)    Migraine    Seizures (HCC)    Review of Systems  Cardiovascular:  Positive for leg swelling.  Neurological:  Positive for seizures.  All other systems reviewed and are negative.  Allergies  Allergen Reactions   Ambien [Zolpidem Tartrate] Other (See Comments)    death   Ambien [Zolpidem Tartrate]    Clindamycin/Lincomycin Nausea And Vomiting   Keflex [Cephalexin] Nausea And  Vomiting   Methylphenidate Other (See Comments)    "I pretty much died in my dad's arms"   Ritalin [Methylphenidate Hcl] Other (See Comments)    death   Ritalin [Methylphenidate Hcl]    Zolpidem Other (See Comments)    "I pretty much died a few times"   Past Surgical History:  Procedure Laterality Date   ABSCESS DRAINAGE     CESAREAN SECTION     DILATION AND CURETTAGE OF UTERUS     INCISION AND DRAINAGE PERIRECTAL ABSCESS Right 09/18/2021   Procedure: IRRIGATION AND DEBRIDEMENT PERIRECTAL ABSCESS;  Surgeon: Campbell Lerner, MD;  Location: [facility] ORS;  Service: General;  Laterality: Right;   TUBAL LIGATION  "2010"   MEDICATIONS: (Not in an outpatient encounter)    Current Facility-Administered Medications:    0.9 %  sodium chloride infusion, , Intravenous, Continuous, Irena Cords V, MD, Last Rate: 20 mL/hr at 08/18/22 0034, New Bag at 08/18/22 0034   acetaminophen (TYLENOL) tablet 650 mg, 650 mg, Oral, Q6H PRN **OR** acetaminophen (TYLENOL) suppository 650 mg, 650 mg, Rectal, Q6H PRN, Gertha Calkin, MD   amoxicillin-clavulanate (AUGMENTIN) 875-125 MG per tablet 1 tablet, 1 tablet, Oral, BID, Siadecki, Sebastian, MD, 1 tablet at 08/17/22 2323   enoxaparin (LOVENOX) injection 100 mg, 1 mg/kg, Subcutaneous, Q12H, Barrie Folk, RPH, 100 mg at 08/17/22 2323   HYDROcodone-acetaminophen (NORCO/VICODIN) 5-325 MG per tablet 1 tablet, 1 tablet, Oral, Q4H PRN, Irena Cords V, MD   morphine (PF) 2 MG/ML injection 2 mg, 2 mg, Intravenous, Q4H PRN, Gertha Calkin,  MD, 2 mg at 08/18/22 0106   nicotine (NICODERM CQ - dosed in mg/24 hours) patch 21 mg, 21 mg, Transdermal, Daily, Irena Cords V, MD, 21 mg at 08/18/22 0034   sodium chloride flush (NS) 0.9 % injection 3 mL, 3 mL, Intravenous, Q12H, Gertha Calkin, MD, 3 mL at 08/18/22 0107    ED Course: Pt in Ed is alert/awake oriented and eating when I saw her in ed. Pt did not report any dental issues to me or any swallowing issue in ed.  Vitals:    08/17/22 1905 08/17/22 2116 08/17/22 2325 08/18/22 0032  BP: 136/68  (!) 119/58 133/78  Pulse: 88  66 64  Resp: 18  18 18   Temp: 98.9 F (37.2 C)  98.7 F (37.1 C) 98.8 F (37.1 C)  TempSrc:   Oral Oral  SpO2: 99%  97% 100%  Weight:  99.8 kg  99.1 kg  Height:    5\' 5"  (1.651 m)   Total I/O In: 240 [P.O.:240] Out: -  SpO2: 100 % Blood work in ed shows: CMP shows glucose 120. And normal kidney / LFT. CBC is normal.  USG of lower extremity shows: 1. Occlusive DVT within the distal RIGHT popliteal vein, RIGHT posterior tibial vein and RIGHT peroneal vein. 2. Limited evaluation of the LEFT posterior tibial vein and LEFT peroneal vein with nonocclusive DVT within the LEFT femoral vein and LEFT popliteal vein. Pt started on Lovenox in ed per pharmacy consult.  Results for orders placed or performed during the hospital encounter of 08/17/22 (from the past 72 hour(s))  CBC with Differential     Status: Abnormal   Collection Time: 08/17/22  7:12 PM  Result Value Ref Range   WBC 8.5 4.0 - 10.5 K/uL   RBC 3.96 3.87 - 5.11 MIL/uL   Hemoglobin 11.6 (L) 12.0 - 15.0 g/dL   HCT 04.5 (L) 40.9 - 81.1 %   MCV 90.2 80.0 - 100.0 fL   MCH 29.3 26.0 - 34.0 pg   MCHC 32.5 30.0 - 36.0 g/dL   RDW 91.4 78.2 - 95.6 %   Platelets 265 150 - 400 K/uL   nRBC 0.0 0.0 - 0.2 %   Neutrophils Relative % 69 %   Neutro Abs 5.9 1.7 - 7.7 K/uL   Lymphocytes Relative 20 %   Lymphs Abs 1.7 0.7 - 4.0 K/uL   Monocytes Relative 7 %   Monocytes Absolute 0.6 0.1 - 1.0 K/uL   Eosinophils Relative 3 %   Eosinophils Absolute 0.3 0.0 - 0.5 K/uL   Basophils Relative 1 %   Basophils Absolute 0.0 0.0 - 0.1 K/uL   Immature Granulocytes 0 %   Abs Immature Granulocytes 0.03 0.00 - 0.07 K/uL    Comment: Performed at [facility], [address], [place], [place] [address]  Basic metabolic panel     Status: Abnormal   Collection Time: 08/17/22  7:12 PM  Result Value Ref Range   Sodium 136 135 - 145 mmol/L    Potassium 3.8 3.5 - 5.1 mmol/L   Chloride 104 98 - 111 mmol/L   CO2 25 22 - 32 mmol/L   Glucose, Bld 120 (H) 70 - 99 mg/dL    Comment: Glucose reference range applies only to samples taken after fasting for at least 8 hours.   BUN 14 6 - 20 mg/dL   Creatinine, Ser 6.57 0.44 - 1.00 mg/dL   Calcium 8.5 (L) 8.9 - 10.3 mg/dL   GFR, Estimated >84 >  60 mL/min    Comment: (NOTE) Calculated using the CKD-EPI Creatinine Equation (2021)    Anion gap 7 5 - 15    Comment: Performed at [facility], [address] [place], [place] [address]  Hepatic function panel     Status: Abnormal   Collection Time: 08/17/22  7:12 PM  Result Value Ref Range   Total Protein 6.1 (L) 6.5 - 8.1 g/dL   Albumin 3.4 (L) 3.5 - 5.0 g/dL   AST 20 15 - 41 U/L   ALT 11 0 - 44 U/L   Alkaline Phosphatase 70 38 - 126 U/L   Total Bilirubin 0.6 0.3 - 1.2 mg/dL   Bilirubin, Direct <2.1 0.0 - 0.2 mg/dL   Indirect Bilirubin NOT CALCULATED 0.3 - 0.9 mg/dL    Comment: Performed at [facility], [address] [place], [place] [address]  Protime-INR     Status: None   Collection Time: 08/17/22 11:10 PM  Result Value Ref Range   Prothrombin Time 13.2 11.4 - 15.2 seconds   INR 1.0 0.8 - 1.2    Comment: (NOTE) INR goal varies based on device and disease states. Performed at [facility], [address] [place], [place] [address]   APTT     Status: Abnormal   Collection Time: 08/17/22 11:10 PM  Result Value Ref Range   aPTT 23 (L) 24 - 36 seconds    Comment: Performed at [facility], [address] [place], [place] [address]    Lab Results  Component Value Date   CREATININE 0.94 08/17/2022   CREATININE 0.91 03/08/2022   CREATININE 1.08 (H) 02/02/2022      Latest Ref Rng & Units 08/17/2022    7:12 PM 03/08/2022   11:10 PM 02/02/2022   11:36 AM  CMP  Glucose 70 - 99 mg/dL 841  324  401   BUN 6 - 20 mg/dL 14  14  10    Creatinine 0.44 - 1.00 mg/dL 0.27  2.53  6.64   Sodium  135 - 145 mmol/L 136  137  135   Potassium 3.5 - 5.1 mmol/L 3.8  3.7  3.6   Chloride 98 - 111 mmol/L 104  107  107   CO2 22 - 32 mmol/L 25  20  23    Calcium 8.9 - 10.3 mg/dL 8.5  9.4  8.7   Total Protein 6.5 - 8.1 g/dL 6.1  7.8    Total Bilirubin 0.3 - 1.2 mg/dL 0.6  0.8    Alkaline Phos 38 - 126 U/L 70  60    AST 15 - 41 U/L 20  17    ALT 0 - 44 U/L 11  11      Unresulted Labs (From admission, onward)     Start     Ordered   08/18/22 0500  Comprehensive metabolic panel  Tomorrow morning,   R        08/17/22 2330   08/18/22 0500  CBC  Tomorrow morning,   R        08/17/22 2330   08/17/22 2333  Hemoglobin A1c  Add-on,   AD        08/17/22 2332           Pt has received : Orders Placed This Encounter  Procedures   US Venous Img Lower Bilateral    Standing Status:   Standing    Number of Occurrences:   1    Order Specific Question:  Reason for Exam (SYMPTOM  OR DIAGNOSIS REQUIRED)    Answer:   r/o dvt   CT MAXILLOFACIAL  LTD WO CM    Standing Status:   Standing    Number of Occurrences:   1   CBC with Differential    Standing Status:   Standing    Number of Occurrences:   1   Basic metabolic panel    Standing Status:   Standing    Number of Occurrences:   1   Protime-INR    Standing Status:   Standing    Number of Occurrences:   1   APTT    Standing Status:   Standing    Number of Occurrences:   1   Hepatic function panel    Standing Status:   Standing    Number of Occurrences:   1   Comprehensive metabolic panel    Standing Status:   Standing    Number of Occurrences:   1   CBC    Standing Status:   Standing    Number of Occurrences:   1   Hemoglobin A1c    Standing Status:   Standing    Number of Occurrences:   1   Diet Heart Room service appropriate? Yes; Fluid consistency: Thin    Standing Status:   Standing    Number of Occurrences:   1    Order Specific Question:   Room service appropriate?    Answer:   Yes    Order Specific Question:   Fluid  consistency:    Answer:   Thin   Maintain IV access    Standing Status:   Standing    Number of Occurrences:   1   Vital signs    Standing Status:   Standing    Number of Occurrences:   1   Notify physician (specify)    Standing Status:   Standing    Number of Occurrences:   20    Order Specific Question:   Notify Physician    Answer:   for pulse less than 55 or greater than 120    Order Specific Question:   Notify Physician    Answer:   for respiratory rate less than 12 or greater than 25    Order Specific Question:   Notify Physician    Answer:   for temperature greater than 100.5 F    Order Specific Question:   Notify Physician    Answer:   for urinary output less than 30 mL/hr for four hours    Order Specific Question:   Notify Physician    Answer:   for systolic BP less than 90 or greater than 160, diastolic BP less than 60 or greater than 100    Order Specific Question:   Notify Physician    Answer:   for new hypoxia w/ oxygen saturations < 88%   Progressive Mobility Protocol: No Restrictions    Standing Status:   Standing    Number of Occurrences:   1   Daily weights    Standing Status:   Standing    Number of Occurrences:   1   Intake and Output    Standing Status:   Standing    Number of Occurrences:   1   Do not place and if present remove PureWick    Standing Status:   Standing    Number of Occurrences:   1   Initiate Oral Care Protocol  Standing Status:   Standing    Number of Occurrences:   1   Initiate Carrier Fluid Protocol    Standing Status:   Standing    Number of Occurrences:   1   RN may order General Admission PRN Orders utilizing "General Admission PRN medications" (through manage orders) for the following Brianna Ashley needs: allergy symptoms (Claritin), cold sores (Carmex), cough (Robitussin DM), eye irritation (Liquifilm Tears), hemorrhoids (Tucks), indigestion (Maalox), minor skin irritation (Hydrocortisone Cream), muscle pain Romeo Apple Gay), nose irritation  (saline nasal spray) and sore throat (Chloraseptic spray).    Standing Status:   Standing    Number of Occurrences:   [id number]   Cardiac Monitoring Continuous x 48 hours Indications for use: Other; Other indications for use: DVT    Standing Status:   Standing    Number of Occurrences:   1    Order Specific Question:   Indications for use:    Answer:   Other    Order Specific Question:   Other indications for use:    Answer:   DVT   Full code    Standing Status:   Standing    Number of Occurrences:   1    Order Specific Question:   By:    Answer:   Other   ENOXAPARIN (LOVENOX) PER PHARMACY CONSULT    Standing Status:   Standing    Number of Occurrences:   1    Order Specific Question:   Indication:    Answer:   VTE Treatment   Consult to hospitalist  5901    5901    Standing Status:   Standing    Number of Occurrences:   1    Order Specific Question:   Place call to:    Answer:   Hospitalist    Order Specific Question:   Reason for Consult    Answer:   Admit    Order Specific Question:   Diagnosis/Clinical Info for Consult:    Answer:   Bilat acute DVT, homeless, can't get meds   Consult to Transition of Care    Standing Status:   Standing    Number of Occurrences:   1    Order Specific Question:   Reason for Consult:    Answer:   Medication Assistance   Inpatient consult to spiritual care    Standing Status:   Standing    Number of Occurrences:   1    Order Specific Question:   Reason for Consult    Answer:   Create or update advance directive   Pulse oximetry check with vital signs    Standing Status:   Standing    Number of Occurrences:   1   Oxygen therapy Mode or (Route): Nasal cannula; Liters Per Minute: 2; Keep 02 saturation: greater than 92 %    Standing Status:   Standing    Number of Occurrences:   20    Order Specific Question:   Mode or (Route)    Answer:   Nasal cannula    Order Specific Question:   Liters Per Minute    Answer:   2    Order Specific  Question:   Keep 02 saturation    Answer:   greater than 92 %   Place in observation (Brianna Ashley's expected length of stay will be less than 2 midnights)    Standing Status:   Standing    Number of Occurrences:   1    Order Specific Question:  Hospital Area    Answer:   [facility] [[id number]]    Order Specific Question:   Level of Care    Answer:   Telemetry Medical [104]    Order Specific Question:   Covid Evaluation    Answer:   Asymptomatic - no recent exposure (last 10 days) testing not required    Order Specific Question:   Diagnosis    Answer:   DVT (deep venous thrombosis) (HCC) [161096]    Order Specific Question:   Admitting Physician    Answer:   Darrold Junker    Order Specific Question:   Attending Physician    Answer:   Darrold Junker   Seizure precautions    Standing Status:   Standing    Number of Occurrences:   1    Meds ordered this encounter  Medications   oxyCODONE-acetaminophen (PERCOCET/ROXICET) 5-325 MG per tablet 1 tablet   enoxaparin (LOVENOX) injection 100 mg   amoxicillin-clavulanate (AUGMENTIN) 875-125 MG per tablet 1 tablet   sodium chloride flush (NS) 0.9 % injection 3 mL   0.9 %  sodium chloride infusion   OR Linked Order Group    acetaminophen (TYLENOL) tablet 650 mg    acetaminophen (TYLENOL) suppository 650 mg   HYDROcodone-acetaminophen (NORCO/VICODIN) 5-325 MG per tablet 1 tablet   morphine (PF) 2 MG/ML injection 2 mg   nicotine (NICODERM CQ - dosed in mg/24 hours) patch 21 mg    Admission Imaging : US Venous Img Lower Bilateral  Result Date: 08/17/2022 CLINICAL DATA:  Bilateral lower extremity swelling. EXAM: BILATERAL LOWER EXTREMITY VENOUS DOPPLER ULTRASOUND TECHNIQUE: Gray-scale sonography with graded compression, as well as color Doppler and duplex ultrasound were performed to evaluate the lower extremity deep venous systems from the level of the common femoral vein and including the common femoral,  femoral, profunda femoral, popliteal and calf veins including the posterior tibial, peroneal and gastrocnemius veins when visible. The superficial great saphenous vein was also interrogated. Spectral Doppler was utilized to evaluate flow at rest and with distal augmentation maneuvers in the common femoral, femoral and popliteal veins. COMPARISON:  July 14, 2020 FINDINGS: RIGHT LOWER EXTREMITY Common Femoral Vein: No evidence of thrombus. Normal compressibility, respiratory phasicity and response to augmentation. Saphenofemoral Junction: No evidence of thrombus. Normal compressibility and flow on color Doppler imaging. Profunda Femoral Vein: No evidence of thrombus. Normal compressibility and flow on color Doppler imaging. Femoral Vein: No evidence of thrombus. Normal compressibility, respiratory phasicity and response to augmentation. Popliteal Vein: Evidence of distal occlusive thrombus with abnormal compressibility, respiratory phasicity and response to augmentation. Calf Veins: Evidence of occlusive thrombus with abnormal compressibility and flow on color Doppler imaging. Superficial Great Saphenous Vein: No evidence of thrombus. Normal compressibility. Venous Reflux:  None. Other Findings:  None. LEFT LOWER EXTREMITY Common Femoral Vein: No evidence of thrombus. Normal compressibility, respiratory phasicity and response to augmentation. Saphenofemoral Junction: No evidence of thrombus. Normal compressibility and flow on color Doppler imaging. Profunda Femoral Vein: No evidence of thrombus. Normal compressibility and flow on color Doppler imaging. Femoral Vein: Evidence of nonocclusive thrombus with abnormal compressibility, respiratory phasicity and response to augmentation. Popliteal Vein: Evidence of nonocclusive thrombus with abnormal compressibility, respiratory phasicity and response to augmentation. Calf Veins: The LEFT posterior tibial vein and LEFT peroneal vein are poorly visualized. Superficial Great  Saphenous Vein: No evidence of thrombus. Normal compressibility. Venous Reflux:  None. Other Findings:  None. IMPRESSION: 1. Occlusive DVT within the distal RIGHT popliteal  vein, RIGHT posterior tibial vein and RIGHT peroneal vein. 2. Limited evaluation of the LEFT posterior tibial vein and LEFT peroneal vein with nonocclusive DVT within the LEFT femoral vein and LEFT popliteal vein. Electronically Signed   By: Aram Candela M.D.   On: 08/17/2022 20:11    Physical Examination: Vitals:   08/17/22 1905 08/17/22 2116 08/17/22 2325 08/18/22 0032  BP: 136/68  (!) 119/58 133/78  Pulse: 88  66 64  Temp: 98.9 F (37.2 C)  98.7 F (37.1 C) 98.8 F (37.1 C)  Resp: 18  18 18   Height:    5\' 5"  (1.651 m)  Weight:  99.8 kg  99.1 kg  SpO2: 99%  97% 100%  TempSrc:   Oral Oral  BMI (Calculated):    36.36   Physical Exam Vitals and nursing note reviewed.  Constitutional:      General: She is not in acute distress. HENT:     Head: Normocephalic and atraumatic.     Right Ear: Hearing normal.     Left Ear: Hearing normal.     Nose: Nose normal. No nasal deformity.     Mouth/Throat:     Lips: Pink.     Tongue: No lesions.     Pharynx: Oropharynx is clear.  Eyes:     General: Lids are normal.     Extraocular Movements: Extraocular movements intact.  Cardiovascular:     Rate and Rhythm: Normal rate and regular rhythm.     Heart sounds: Normal heart sounds.  Pulmonary:     Effort: Pulmonary effort is normal.     Breath sounds: Normal breath sounds.  Abdominal:     General: Bowel sounds are normal. There is no distension.     Palpations: Abdomen is soft. There is no mass.     Tenderness: There is no abdominal tenderness.  Musculoskeletal:     Right lower leg: Edema present.     Left lower leg: Edema present.  Skin:    General: Skin is warm.  Neurological:     General: No focal deficit present.     Mental Status: She is alert and oriented to person, place, and time.     Cranial Nerves:  Cranial nerves 2-12 are intact.  Psychiatric:        Attention and Perception: Attention normal.        Mood and Affect: Mood normal.        Speech: Speech normal.        Behavior: Behavior normal. Behavior is cooperative.     Assessment and Plan: * DVT (deep venous thrombosis) (HCC) 2/2 Factor v Leiden.  Cont Lovenox. Vascular consult per AM team.    Pain in tooth Pt c/o tooth pain to ed md and told nurse that she has small knot in gum and right ear pain.  Due to her h/o tonsillar abscess I am worried she may have tooth abscess or parotid issue. We will get MF ct.    Seizures (HCC) Seizure precautions.  No AED in chart. ' Last seizure was 5 months ago and pt is not on any meds.     Tobacco abuse Nicotine patch, \  Factor V Leiden (HCC) Pt states she is at risk for blood clots.  Cannot afford eliquis.  CM consult for eliquis from Brianna Ashley assistance.    Dental infection ? Tooth abscess or ear or parotid issues we will get MF CT  Continue Augmentin.  >Anemia: CBC    Component Value Date/Time  WBC 8.5 08/17/2022 1912   RBC 3.96 08/17/2022 1912   HGB 11.6 (L) 08/17/2022 1912   HGB 9.8 (L) 10/05/2013 0409   HCT 35.7 (L) 08/17/2022 1912   HCT 36.5 10/01/2013 1311   PLT 265 08/17/2022 1912   PLT 332 10/01/2013 1311   MCV 90.2 08/17/2022 1912   MCV 91 10/01/2013 1311   MCH 29.3 08/17/2022 1912   MCHC 32.5 08/17/2022 1912   RDW 14.5 08/17/2022 1912   RDW 13.3 10/01/2013 1311   LYMPHSABS 1.7 08/17/2022 1912   LYMPHSABS 2.1 10/01/2013 1311   MONOABS 0.6 08/17/2022 1912   MONOABS 1.7 (H) 10/01/2013 1311   EOSABS 0.3 08/17/2022 1912   EOSABS 0.1 10/01/2013 1311   BASOSABS 0.0 08/17/2022 1912   BASOSABS 0.1 10/01/2013 1311  Mild / New. We will follow cbc.   Other orders -     CBC with Differential/Platelet; Standing -     Basic metabolic panel; Standing -     US Venous Img Lower Bilateral (DVT); Standing -     enoxaparin (LOVENOX) per pharmacy consult;  Standing -     Protime-INR; Standing -     APTT; Standing -     oxyCODONE-Acetaminophen -     Enoxaparin Sodium -     Consult to hospitalist; Standing -     Amoxicillin-Pot Clavulanate -     Hepatic function panel; Standing -     On Pharmacologic VTE Prophylaxis  (no mechanical VTE prophylaxis ordered); Standing -     Place in observation (Brianna Ashley's expected length of stay will be less than 2 midnights); Standing -     Maintain IV access; Standing -     sodium chloride flush -     Vital signs; Standing -     Pulse oximetry (single); Standing -     Notify physician (specify); Standing -     Progressive Mobility Protocol: No Restrictions; Standing -     Weigh Brianna Ashley; Standing -     Intake and output; Standing -     Do not place and if present remove PureWick; Standing -     Initiate Oral Care Protocol; Standing -     Initiate Carrier Fluid Protocol; Standing -     Oxygen therapy; Standing -     Comprehensive metabolic panel; Standing -     CBC; Standing -     Sodium Chloride -     Acetaminophen -     Acetaminophen -     HYDROcodone-Acetaminophen -     Morphine Sulfate (PF) -     RN may order General Admission PRN Orders utilizing "General Admission PRN medications" (through manage orders) for the following Brianna Ashley needs:; Standing -     On Full Dose Anticoagulation  (satisfies pharmacologic VTE prophylaxis requirement); Standing -     Full code; Standing -     Cardiac Monitoring; Standing -     Diet Heart; Standing -     Nicotine -     Seizure precautions; Standing -     Hemoglobin A1c; Standing    DVT prophylaxis:  Lovenox.   Code Status:  Full code      08/18/2022   12:40 AM  Advanced Directives  Does Brianna Ashley Have a Medical Advance Directive? No  Would Brianna Ashley like information on creating a medical advance directive? Yes (Inpatient - Brianna Ashley requests chaplain consult to create a medical advance directive)   Family Communication:  None. Emergency Contact: Contact  Information  Name Relation Home Work Mobile   [place] Friend   [phone]      Disposition Plan:  Homeless.   Consults: None.  Admission status: Inpatient.   Unit / Expected LOS: Med surg / telemetry.  Gertha Calkin MD [facility]  6 PM- 2 AM. [phone] Please use [email or web address] OR call [facility] Admits & Consults @ [phone] to contact current Assigned [facility] Attending/Consulting MD for this Brianna Ashley.

## 2022-08-17 NOTE — Assessment & Plan Note (Signed)
Bilateral DVT.  Case discussed with vascular surgery on-call over the weekend and recommends lifelong anticoagulation and TED hose.  Switched over to Eliquis last night.  Will have to prescribe medications into [facility].

## 2022-08-17 NOTE — ED Triage Notes (Addendum)
Pt has multiple complaints, sts both her legs are swelling for the last 3 days. Pt also sts that she has dental issue and she feels a "pocket" in her upper lip. Pt also c/o leaking from a previous surgical incision that is 36 years old near her tailbone. Pt sts she has a hx of DVT's and is on eliquis.

## 2022-08-17 NOTE — Assessment & Plan Note (Signed)
Will need lifelong anticoagulation 

## 2022-08-17 NOTE — Consult Note (Signed)
ANTICOAGULATION CONSULT NOTE  Pharmacy Consult for enoxaparin Indication: vte treatment  Allergies  Allergen Reactions   Ambien [Zolpidem Tartrate] Other (See Comments)    death   Ambien [Zolpidem Tartrate]    Clindamycin/Lincomycin Nausea And Vomiting   Keflex [Cephalexin] Nausea And Vomiting   Methylphenidate Other (See Comments)    "I pretty much died in my dad's arms"   Ritalin [Methylphenidate Hcl] Other (See Comments)    death   Ritalin [Methylphenidate Hcl]    Zolpidem Other (See Comments)    "I pretty much died a few times"    Patient Measurements:   Patient Weight: 99.8 kg  Vital Signs: Temp: 98.9 F (37.2 C) (05/26 1905) BP: 136/68 (05/26 1905) Pulse Rate: 88 (05/26 1905)  Labs: Recent Labs    08/17/22 1912  HGB 11.6*  HCT 35.7*  PLT 265  CREATININE 0.94    CrCl cannot be calculated (Unknown ideal weight.).   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 Per nurse patient reports not taking for multiple weeks.    Assessment: 37 yo female presented to the ED with complaint of swelling in both her legs.  Pharmacy consulted to start enoxaparin for VTE treatment.   Goal of Therapy:  Anti-Xa level 0.6-1 units/ml 4hrs after LMWH dose given Monitor platelets by anticoagulation protocol: Yes   Plan:  Start enoxaparin 100 mg SubQ every 12 hours Can consider Anti-Xa level if long term use is planned CBC every 72 hours while on enoxaparin  Barrie Folk 08/17/2022,9:11 PM

## 2022-08-18 ENCOUNTER — Observation Stay: Payer: Medicaid Other

## 2022-08-18 DIAGNOSIS — D649 Anemia, unspecified: Secondary | ICD-10-CM

## 2022-08-18 DIAGNOSIS — Z720 Tobacco use: Secondary | ICD-10-CM

## 2022-08-18 DIAGNOSIS — I82433 Acute embolism and thrombosis of popliteal vein, bilateral: Secondary | ICD-10-CM | POA: Diagnosis not present

## 2022-08-18 DIAGNOSIS — K029 Dental caries, unspecified: Secondary | ICD-10-CM | POA: Diagnosis not present

## 2022-08-18 DIAGNOSIS — R569 Unspecified convulsions: Secondary | ICD-10-CM

## 2022-08-18 DIAGNOSIS — D6851 Activated protein C resistance: Secondary | ICD-10-CM

## 2022-08-18 DIAGNOSIS — K047 Periapical abscess without sinus: Secondary | ICD-10-CM | POA: Insufficient documentation

## 2022-08-18 LAB — COMPREHENSIVE METABOLIC PANEL
ALT: 9 U/L (ref 0–44)
AST: 15 U/L (ref 15–41)
Albumin: 2.8 g/dL — ABNORMAL LOW (ref 3.5–5.0)
Alkaline Phosphatase: 66 U/L (ref 38–126)
Anion gap: 3 — ABNORMAL LOW (ref 5–15)
BUN: 15 mg/dL (ref 6–20)
CO2: 25 mmol/L (ref 22–32)
Calcium: 8.3 mg/dL — ABNORMAL LOW (ref 8.9–10.3)
Chloride: 109 mmol/L (ref 98–111)
Creatinine, Ser: 1.01 mg/dL — ABNORMAL HIGH (ref 0.44–1.00)
GFR, Estimated: >60 mL/min (ref >60)
Glucose, Bld: 128 mg/dL — ABNORMAL HIGH (ref 70–99)
Potassium: 4.6 mmol/L (ref 3.5–5.1)
Sodium: 137 mmol/L (ref 135–145)
Total Bilirubin: 0.2 mg/dL — ABNORMAL LOW (ref 0.3–1.2)
Total Protein: 5.5 g/dL — ABNORMAL LOW (ref 6.5–8.1)

## 2022-08-18 LAB — CBC
HCT: 33.8 % — ABNORMAL LOW (ref 36.0–46.0)
Hemoglobin: 10.9 g/dL — ABNORMAL LOW (ref 12.0–15.0)
MCH: 29.1 pg (ref 26.0–34.0)
MCHC: 32.2 g/dL (ref 30.0–36.0)
MCV: 90.1 fL (ref 80.0–100.0)
Platelets: 270 10*3/uL (ref 150–400)
RBC: 3.75 MIL/uL — ABNORMAL LOW (ref 3.87–5.11)
RDW: 14.6 % (ref 11.5–15.5)
WBC: 7.2 10*3/uL (ref 4.0–10.5)
nRBC: 0 % (ref 0.0–0.2)

## 2022-08-18 MED ORDER — APIXABAN 5 MG PO TABS: 5.0000 mg | ORAL_TABLET | Freq: Two times a day (BID) | ORAL | Status: DC

## 2022-08-18 MED ORDER — APIXABAN 5 MG PO TABS
10.0000 mg | ORAL_TABLET | Freq: Two times a day (BID) | ORAL | Status: DC
  Administered 2022-08-18 – 2022-08-23 (×10): 10 mg via ORAL
  Filled 2022-08-18 (×10): qty 2

## 2022-08-18 NOTE — Assessment & Plan Note (Signed)
Seizure precautions.  No AED in chart.

## 2022-08-18 NOTE — Assessment & Plan Note (Signed)
Hemoglobin 10.9

## 2022-08-18 NOTE — Assessment & Plan Note (Signed)
Patient also has upper lip pain.  Continue Augmentin.  With poor dentition would recommend [facility].

## 2022-08-18 NOTE — Discharge Instructions (Addendum)
Rent/Utility/Housing  Agency Name: [facility] Agency Address: [address] [place], [place] [address] Phone: [phone] Email: [email or web address] Website: [email or web address] Service(s) Offered: Housing services, self-sufficiency, congregate meal  program, weatherization program, Field seismologist program, emergency food assistance,  housing counseling, home ownership program, wheels -towork program.  Agency Name: [facility] Address: [address] [place], [place] [address] Phone: [phone] (8a-4p) [phone] (8p- 10p) Email: [email or web address] Website: [email or web address] Service(s) Offered: A program for homeless and/or needy men that includes one-on-one counseling, life skills training and job rehabilitation.  Agency Name: [facility] [place] Address: [address] [place], [place] [address] Phone: [phone] Website: www.[email or web address].org Service(s) Offered: Assistance to needy in emergency with utility bills, heating  fuel, and prescriptions. Shelter for homeless 7pm-7am. July 17, 2016 15  Agency Name: [facility] [place] ([facility] Disabled) Address: [address], [place], [place] [address] Phone: [phone] Contact Person: Cathleen Corti Email: [email or web address] Website: [email or web address] Service(s) Offered: Helps individuals with developmental disabilities move  from housing that is more restrictive to homes where they  can achieve greater independence and have more  opportunities.  Agency Name: [facility] Address: [address], [place], [place] [address] Phone: [phone] Email: [email or web address] Website: www.[email or web address].org Service(s) Offered: Provides affordable housing for low-income families,  elderly, and disabled individuals. Offer a wide range of  programs and services, from financial planning  to afterschool and summer programs.  Agency Name: [facility] Services Address: [address] [place], [place] [address] Phone: [phone] Service(s) Offered: Child support services; child welfare services; food stamps;  Medicaid; work first family assistance; and aid with fuel,  rent, food and medicine.  Agency Name: [facility] [place] [employer]. Address: [facility] [address] [place], [place]  [address] Phone: [phone] Website: www.familyabuseservices.org Service(s) Offered: 24 hour Crisis Line: [phone]; 24 hour Emergency Shelter;  Transitional Housing; Support Groups; Scientist, physiological;  [employer]; Hispanic Outreach: [phone];  Visitation Center: [phone]. July 17, 2016 16  Agency Name: [facility] [place]. Address: [address] [place], [place] [address] Phone: [phone] Service(s) Offered: CAP Services; Home and [employer]; Individual  or Group Supports; Respite Care Non-Institutional Nursing;  Residential Supports; Respite Care and Personal Care  Services; Transportation; Family and Friends Night;  Recreational Activities; Three Nutritious Meals/Snacks;  Consultation with Registered Dietician; Twenty-four hour  Registered Nurse Access; Daily and [employer]; Camp Green Leaves; [place] for the [employer] (During Summer Months) Bingo Night (Every  Wednesday Night); Special Populations Dance Night  (Every Tuesday Night); Professional Hair Care Services.  Agency Name: [facility] Address: [address], [place], [place] [address] Phone: [phone] Contact Person: Jabier Mutton Website: [email or web address].Physicist, medical) Offered: Residential treatment facility for women; food and  clothing, educational & employment development and  transportation to work; Counsellor of financial skills;  parenting and family  reunification; emotional and spiritual  support; transitional housing for program graduates.  Agency Name: [facility] Address: [address] [place], [place] [address] Phone: [phone] Email: [email or web address] Website: TaskTown.es Service(s) Offered: Public housing units for elderly, disabled, and low income  people; housing choice vouchers for income eligible  applicants; shelter plus care vouchers; and [employer] program. July 17, 2016 17  Agency Name: [facility] Address: [address] [place], [place] [address] Phone: [phone] Email: [email or web address] Website: www.habitatalamance.org Service(s) Offered: Build houses for families in need of decent housing. Each  adult in the family must invest 200 hours of labor on  someone else's house, work with volunteers to build their  own house, attend classes on budgeting, home maintenance, yard care, and attend homeowner association  meetings.  Agency Name: [facility] Address: [address], [place], [place] [address] Phone: [phone] Website: www.rsli.org Service(s) Offered: Intermediate care facilities for mentally retarded,  Supervised Living in group homes for adults with  developmental disabilities, Supervised Living for people  who have dual diagnoses (MRMI), Independent Living,  Supported Living, respite and a variety of CAP services,  pre-vocational services, day supports, and [employer].  Agency Name: [place] [employer] Phone: [phone] Website: www.NCForeclosurePrevention.gov Service(s) Offered: Zero-interest, deferred loans to homeowners struggling to  pay their mortgage. Call for more information   Food Resources  Agency Name: [facility] Agency Address: [address], [place], [place] [address] Phone: [phone] Website: www.alamanceservices.org  Service(s) Offered:  Housing services, self-sufficiency, congregate meal  program, weatherization program, Field seismologist program, emergency food assistance,  housing counseling, home ownership program, wheels -towork program. Meals free for 60 and older at various  locations from 9am-1pm, Monday-Friday:  [employer], [address] [place], [phone] [facility], [address], [place] [phone]   [facility], [address]   [place] [phone] The [address], [address],   [place], [phone]  Agency Name: [facility] Address: [phone] [address], [place], [place] [address] Phone: [phone] Website: www.[email or web address].org Service(s) Offered: Home delivered hot, frozen, and emergency  meals. Grocery assistance program which matches  volunteers one-on-one with seniors unable to grocery shop  for themselves. Must be 60 years and older; less than 20  hours of in-home aide service, limited or no driving ability;  live alone or with someone with a disability; live in  [place].  Agency Name: [facility]) Address: [address] [place], [place] [address] Phone: [phone] Service(s) Offered: Food is served to shut-ins, homeless, elderly, and low  income people in the community every Saturday (11:30  am-12:30 pm) and Sunday (12:30 pm-1:30pm). Volunteers  also offer help and encouragement in seeking employment,  and spiritual guidance. July 17, 2016 8  Agency Name: Department of Social Services Address: [address] [place], [place] [address] Phone: [phone] Service(s) Offered: Child support services; child welfare services; food stamps;  Medicaid; work first family assistance; and aid with fuel,  rent, food and medicine.  Agency Name: [facility] Address: [address] [place], [place] Phone: [phone] Website:  www.dreamalign.com Services Offered: Monday 10:00am-12:00, 8:00pm-9:00pm, and Friday  10:00am-12:00. Agency Name: [facility] [place] Address: [address], [place], [place] [address] Phone: [phone] Website: www.[email or web address].org Service(s) Offered: Serves weekday meals, open from 11:30 am- 1:00 pm., and  6:30-7:30pm, Monday-Wednesday-Friday distributes food  3:30-6pm, Monday-Wednesday-Friday.  Agency Name: [facility] Address: [address] [place], [place] Phone: [phone] Website: [email or web address].[email or web address] Services Offered: Distributes food the 4th Saturday of the month, starting at  8:00 am Agency Name: [facility] Address: [phone] [address] [place], [place] [address] Phone: [phone] Website: [email or web address] Service(s) Offered: Bread of life, weekly food pantry. Open Wednesdays from  10:00am-noon.  Agency Name: [facility] [employer] [facility] Address: [address] [place], [place], [place] Phone: [phone] Services Offered: Distributes food 9am-1pm, Monday-Thursday. Call for details.   Agency Name: [facility] Address: [address] [place], [place] [address] Phone: [phone] Website: [email or web address] Service(s) Offered: Games developer. Call for assistance. Agency Name: [facility] Address: [address] [place], [place] [address] Phone: [phone] Service Offered: Emergency Food Pantry. Call for appointment.  Agency Name: [facility] Address: [address], [place], [place] [address] Phone: [phone] Website: [email or web address] Services Offered: Games developer. Call for details Agency Name: [facility] Address: [address] [place], [place] Phone: [phone] Website: [email or web address] Service(s) Offered: Emergency Food Pantry. Call for details.  Agency Name: [facility] Address: [address], [place], [place] [address] Phone: [phone] or [phone] Website: [email or web address] Service(s) Offered: Distribute food 9am-11:30 am, Tuesday-Friday, and 1- 3:30pm, Monday-Friday. Food pantry Monday-Friday  1pm-3pm, fresh items, Mon.-Wed.-Fri.  Agency Name: [facility] Address: [address] [place], [place] [address] Phone: [phone] Website: www.safealamance.org Services Offered: Distribute food Tues and Sats from 9:00am-noon. Closed  1st Saturday of each month. Call for details   Rent/Utility/Housing  Agency Name: [facility] Agency Address: [address] [place], [place] [address] Phone: [phone] Email: [email or web address] Website: www.[email or web address].org Service(s) Offered: Housing services, self-sufficiency, congregate meal  program, weatherization program, Field seismologist program, emergency food assistance,  housing counseling, home ownership program, wheels -towork program.  Agency Name: [facility] Address: [address], [place], [place] [address] Phone: [phone] (8a-4p) [phone] (8p- 10p) Email: [email or web address] Website: [email or web address] Service(s) Offered: A program for homeless and/or needy men that includes one-on-one counseling, life skills training and job rehabilitation.  Agency Name: [facility] [place] Address: [address] [place], [place] [address] Phone: [phone] Website: www.alliedchurches.org Service(s) Offered: Assistance to needy in emergency with utility bills, heating  fuel, and prescriptions. Shelter for homeless 7pm-7am. July 17, 2016 15  Agency Name: [facility] [place] (Developmentally Disabled) Address: [address], [place], [place] [address] Phone: [phone] Contact Person: Cathleen Corti Email: [email or web address] Website:  [email or web address] Service(s) Offered: Helps individuals with developmental disabilities move  from housing that is more restrictive to homes where they  can achieve greater independence and have more  opportunities.  Agency Name: [facility] Address: [address], [place], [place] [address] Phone: [phone] Email: [email or web address] Website: www.burlingtonhousingauthority.org Service(s) Offered: Provides affordable housing for low-income families,  elderly, and disabled individuals. Offer a wide range of  programs and services, from financial planning to afterschool and summer programs.  Agency Name: Department of Social Services Address: [address] [place], [place] [address] Phone: [phone] Service(s) Offered: Child support services; child welfare services; food stamps;  Medicaid; work first family assistance; and aid with fuel,  rent, food and medicine.  Agency Name: [facility] [place] [employer]. Address: [facility] [address] [place], [place]  [address] Phone: [phone] Website: www.familyabuseservices.org Service(s) Offered: 24 hour Crisis Line: [phone]; 24 hour Emergency Shelter;  Transitional Housing; Support Groups; Scientist, physiological;  [employer]; Hispanic Outreach: [phone];  Visitation Center: [phone]. July 17, 2016 16  Agency Name: [facility] [place]. Address: [address] [place], [place] [address] Phone: [phone] Service(s) Offered: CAP Services; Home and [employer]; Individual  or Group Supports; Respite Care Non-Institutional Nursing;  Residential Supports; Respite Care and Personal Care  Services; Transportation; Family and Friends Night;  Recreational Activities; Three Nutritious Meals/Snacks;  Consultation with Registered Dietician; Twenty-four hour  Registered Nurse Access; Daily and [employer]; Camp Green Leaves; [employer] for the [employer] (During Summer  Months) Bingo Night (Every  Wednesday Night); Special Populations Dance Night  (Every Tuesday Night); Professional Hair Care Services.  Agency Name: [facility] Address: [address], [place], [place] [address] Phone: [phone] Contact Person: Jabier Mutton Website: [email or web address].Physicist, medical) Offered: Residential treatment facility for women; food and  clothing, educational & employment development and  transportation to work; Counsellor of financial skills;  parenting and family reunification; emotional and spiritual  support; transitional housing for program graduates.  Agency Name: [facility] Address: [address] [place], [place] [address] Phone: [phone] Email: [email or web address] Website: TaskTown.es Service(s) Offered: Public housing units for elderly, disabled, and low income  people; housing choice vouchers for income eligible  applicants; shelter plus care vouchers; and [employer] program. July 17, 2016 17  Agency Name: [facility] Address: [address], [place], [place] [address] Phone: [phone] Email: [email or web address] Website: www.habitatalamance.org Service(s) Offered: Build houses for families in need of decent housing. Each  adult in the family must invest 200 hours of labor on  someone else's house, work with volunteers to build their  own house, attend classes on budgeting, home maintenance, yard care, and attend homeowner association  meetings.  Agency Name: [facility] Address: [address] [place], [place] [address] Phone: [phone] Website: www.rsli.org Service(s) Offered: Intermediate care facilities for mentally retarded,  Supervised Living in group homes for adults with  developmental disabilities, Supervised Living for people  who have dual diagnoses (MRMI), Independent Living,   Supported Living, respite and a variety of CAP services,  pre-vocational services, day supports, and [employer].  Agency Name: [place] [employer] [employer] Phone: [phone] Website: www.NCForeclosurePrevention.gov Service(s) Offered: Zero-interest, deferred loans to homeowners struggling to  pay their mortgage. Call for more information   Transportation Agency Name: [facility] Address: [address] [place], [place] [address] Phone: [phone] Email: [email or web address] Website: www.[email or web address].org Service(s) Offered: [employer], self-sufficiency, congregate meal  program, weatherization program, Field seismologist program, emergency food assistance,  housing counseling, home ownership program, wheels-towork program. July 17, 2016 22  Agency Name: [facility] [employer] [phone]) Address: [address], [place], [place] [address] Phone: [phone] Email:  Website: www.acta-[email or web address].com Service(s) Offered: Transportation for general public, subscription and demand  response; Dial-a-Ride for citizens 26 years of age or older. Agency Name: Department of Social Services Address: [address] [place], [place] [address] Phone: [phone] Service(s) Offered: Child support services; child welfare services; food stamps;  Medicaid; work first family assistance; and aid with fuel,  rent, food and medicine, transportation assistance.  Agency Name: Disabled [employer] (DAV) Transportation  Network Address: Phone: [phone] Service(s) Offered: Transports veterans to the [facility]. Call  forty-eight hours in advance and leave the name, telephone  number, date, and time of appointment. Veteran will be  contacted by the driver the day before the appointment to  arrange a pick up point

## 2022-08-18 NOTE — Assessment & Plan Note (Signed)
Nicotine patch, \

## 2022-08-18 NOTE — Hospital Course (Addendum)
37 year old female coming in with bilateral leg swelling and pain and difficulty ambulating.  Also has pain in her upper gum and poor dentition.  Patient has a history of factor V Leiden deficiency and has been off her Eliquis for at least 2 weeks.  Ultrasound bilateral lower extremities showed occlusive DVT within the distal right popliteal vein, posterior tibial vein and right peroneal vein and nonocclusive DVT within the left femoral and popliteal vein.  Case discussed with vascular surgery Dr. Myra Gianotti and recommended compression hose and anticoagulation lifelong.  CT scan maxillofacial uses extensive dental caries with periapical lucencies.  No abscess seen.  5/28.  PT recommending rehab.  TOC looking into options.  Patient with difficulty ambulating secondary to pain.  Albuterol as needed. 5/29: Continue to have bilateral leg pain which interfere with ambulation.  Awaiting SNF placement. 5/30: Hemodynamically stable.  Continue to complain about bilateral leg pain.  She was very frustrated with her living situation.  Started on Lexapro for anxiety and depression. Still no bed offers. 5/31: Failed multiple attempts to find a Medicaid bed for her.  Patient would like to be discharged back to community.  Wheelchair ordered.  She will leave tomorrow 6/1: Remained hemodynamically stable.  Wheelchair and medications were delivered at bedside.  Patient need to follow-[facility] with [facility] for concern of dental infection and will continue taking Augmentin until 08/31/2022.  She need to continue on Eliquis for rest of her life.  She was also started on Lexapro and PCP need to titrate the dose.  She will continue on current medications and need to have a close follow-up by her providers for further recommendations.

## 2022-08-18 NOTE — Progress Notes (Signed)
Progress Note   Patient: Brianna Ashley ZOX:[id number] DOB: [birth year] DOA: 08/17/2022     0 DOS: the patient was seen and examined on 08/18/2022   Brief hospital course: 37 year old female coming in with bilateral leg swelling and pain and difficulty ambulating.  Also has pain in her upper gum and poor dentition.  Patient has a history of factor V Leiden deficiency and has been off her Eliquis for at least 2 weeks.  Ultrasound bilateral lower extremities showed occlusive DVT within the distal right popliteal vein, posterior tibial vein and right peroneal vein and nonocclusive DVT within the left femoral and popliteal vein.  Case discussed with vascular surgery Dr. Myra Gianotti and recommended compression hose and anticoagulation lifelong.  CT scan maxillofacial uses extensive dental caries with periapical lucencies.  No abscess seen.  Assessment and Plan: * DVT (deep venous thrombosis) (HCC) Bilateral DVT.  Case discussed with vascular surgery on-call and recommends lifelong anticoagulation and TED hose.  Will switch from Lovenox injections over to high-dose Eliquis.  Will have to prescribe medications into [facility] community pharmacy.   Factor V Leiden (HCC) Will need lifelong anticoagulation.  Dental caries Patient also has upper abdominal pain.  Will prescribe Augmentin.  With poor dentition would recommend [facility] dental clinic.  Tobacco abuse Nicotine patch  Seizures (HCC) Seizure precautions.  Not on any seizure medication.    Normocytic anemia Hemoglobin 10.9        Subjective: Patient states that she has been off her Eliquis for over 2 weeks.  Admitted with lower extremity swelling pain and difficulty walking around.  Also having some pain in her upper gum.  Has poor dentition.  Physical Exam: Vitals:   08/18/22 0032 08/18/22 0500 08/18/22 0509 08/18/22 0844  BP: 133/78  129/74 131/68  Pulse: 64  62 73  Resp: 18  18 18   Temp: 98.8 F (37.1 C)  98.5 F (36.9 C) 97.8 F (36.6  C)  TempSrc: Oral  Oral   SpO2: 100%  99% 99%  Weight: 99.1 kg 98.4 kg    Height: 5\' 5"  (1.651 m)      Physical Exam HENT:     Head: Normocephalic.     Mouth/Throat:     Pharynx: No oropharyngeal exudate.  Eyes:     General: Lids are normal.     Conjunctiva/sclera: Conjunctivae normal.  Cardiovascular:     Rate and Rhythm: Normal rate and regular rhythm.     Heart sounds: Normal heart sounds, S1 normal and S2 normal.  Pulmonary:     Breath sounds: No decreased breath sounds, wheezing, rhonchi or rales.  Abdominal:     Palpations: Abdomen is soft.     Tenderness: There is no abdominal tenderness.  Musculoskeletal:     Right lower leg: Swelling present.     Left lower leg: Swelling present.  Skin:    General: Skin is warm.     Findings: No rash.  Neurological:     Mental Status: She is alert and oriented to person, place, and time.     Data Reviewed: Ultrasound lower extremities reviewed showing bilateral DVT CT maxillofacial areas shows dental caries but no abscess Creatinine 1.01, white blood cell count 7.2, hemoglobin 10.9  Disposition: Status is: Observation With the patient's homeless status I will have to prescribe medications into the [facility] community pharmacy (closed today) tomorrow to make sure she gets her Eliquis.  Planned Discharge Destination: Homeless shelter    Time spent: 28 minutes  Author: Alford Highland, MD  08/18/2022 1:48 PM  For on call review [email or web address].

## 2022-08-18 NOTE — Assessment & Plan Note (Deleted)
Pt c/o tooth pain to ed md and told nurse that she has small knot in gum and right ear pain.  Due to her h/o tonsillar abscess I am worried she may have tooth abscess or parotid issue. We will get MF ct.

## 2022-08-18 NOTE — Progress Notes (Signed)
   08/18/22 1100  Spiritual Encounters  Type of Visit Initial  Care provided to: Pt and family  Referral source Patient request  Reason for visit Advance directives  OnCall Visit Yes  Spiritual Framework  Presenting Themes Caregiving needs  Patient Stress Factors Not reviewed  Family Stress Factors Not reviewed  Interventions  Spiritual Care Interventions Made Established relationship of care and support;Compassionate presence;Reflective listening  Spiritual Care Plan  Spiritual Care Issues Still Outstanding Chaplain will continue to follow   Patient receive Advance Directive and was educate on how to fill it out.

## 2022-08-18 NOTE — Evaluation (Signed)
Physical Therapy Evaluation Patient Details Name: Brianna Ashley MRN: [id number] DOB: 05/07/1985 Today's Date: 08/18/2022  History of Present Illness  Per MD assessment: Pt is a 37 year old female coming in with bilateral leg swelling and pain and difficulty ambulating.  Patient has a history of factor V Leiden deficiency and has been off her Eliquis for at least 2 weeks.  Ultrasound bilateral lower extremities showed occlusive DVT within the distal right popliteal vein, posterior tibial vein and right peroneal vein and nonocclusive DVT within the left femoral and popliteal vein.  Case discussed with vascular surgery Dr. Myra Gianotti and recommended compression hose and anticoagulation lifelong.   Clinical Impression  Pt was pleasant and motivated to participate during the session and put forth good effort throughout. Pt required extra time and effort with bed mobility tasks and transfers but no physical assist.  Pt stated she has been ambulating to the BR with her husband during this admission while holding onto the IV pole but that she is very limited by pain in her LLE.  Pt education/training provided on amb with step-to pattern with a RW for LLE pain control and to decrease fall risk secondary to pt having a h/o LLE buckling.  Pt was able to amb 8 feet with very slow, step-to pattern with multi-modal cuing for sequencing provided.  Pt presented without overt LOB or L knee buckling during the effort.  Pt reported no adverse symptoms during the session other than LE pain with SpO2 WNL and HR increasing from a baseline of the 70s to the mid 80s after amb.  Pt will benefit from continued PT services upon discharge to safely address deficits listed in patient problem list for decreased caregiver assistance and eventual return to PLOF.         Recommendations for follow up therapy are one component of a multi-disciplinary discharge planning process, led by the attending physician.  Recommendations may be  updated based on patient status, additional functional criteria and insurance authorization.  Follow Up Recommendations Can patient physically be transported by private vehicle: Yes     Assistance Recommended at Discharge Intermittent Supervision/Assistance  Patient can return home with the following  A little help with walking and/or transfers;A little help with bathing/dressing/bathroom;Assistance with cooking/housework;Assist for transportation;Help with stairs or ramp for entrance    Equipment Recommendations Rolling walker (2 wheels)  Recommendations for Other Services       Functional Status Assessment Patient has had a recent decline in their functional status and demonstrates the ability to make significant improvements in function in a reasonable and predictable amount of time.     Precautions / Restrictions Precautions Precautions: Fall Restrictions Weight Bearing Restrictions: No Other Position/Activity Restrictions: Seizure precautions; ok for pt to be in recliner per Dr. Renae Gloss      Mobility  Bed Mobility Overal bed mobility: Modified Independent             General bed mobility comments: Min extra time and effort and use of bed rails    Transfers Overall transfer level: Needs assistance Equipment used: Rolling walker (2 wheels) Transfers: Sit to/from Stand Sit to Stand: Supervision           General transfer comment: Extra time and effort along with UE assist to come to standing    Ambulation/Gait Ambulation/Gait assistance: Min guard Gait Distance (Feet): 8 Feet Assistive device: Rolling walker (2 wheels) Gait Pattern/deviations: Step-to pattern, Antalgic, Decreased stance time - left, Decreased step length - right Gait velocity: decreased  General Gait Details: Mod verbal cues for step-to pattern to address pain/weakness in LLE; pt able to amb a max of 8 feet before needing to return to sitting secondary to LLE pain and general  weakness  Stairs            Wheelchair Mobility    Modified Rankin (Stroke Patients Only)       Balance Overall balance assessment: Needs assistance   Sitting balance-Leahy Scale: Normal     Standing balance support: Bilateral upper extremity supported, During functional activity Standing balance-Leahy Scale: Good                               Pertinent Vitals/Pain Pain Assessment Pain Assessment: 0-10 Pain Score: 8  Pain Location: BLE with L>R Pain Descriptors / Indicators: Sore, Aching Pain Intervention(s): Repositioned, Premedicated before session, Monitored during session, Patient requesting pain meds-RN notified    Home Living Family/patient expects to be discharged to:: Shelter/Homeless Living Arrangements: Spouse/significant other                 Additional Comments: Pt had been alternating staying with friends and family prior to admission but not an option going forward, currently homeless    Prior Function Prior Level of Function : Needs assist             Mobility Comments: HHA with ambulation limited community distances, 2 falls in the last 6 months secondary to LE buckling, primarily on the LLE       Hand Dominance        Extremity/Trunk Assessment   Upper Extremity Assessment Upper Extremity Assessment: Generalized weakness    Lower Extremity Assessment Lower Extremity Assessment: Generalized weakness       Communication   Communication: No difficulties  Cognition Arousal/Alertness: Awake/alert Behavior During Therapy: WFL for tasks assessed/performed Overall Cognitive Status: Within Functional Limits for tasks assessed                                          General Comments      Exercises     Assessment/Plan    PT Assessment Patient needs continued PT services  PT Problem List Decreased strength;Decreased activity tolerance;Decreased mobility;Decreased balance;Decreased knowledge  of use of DME;Pain       PT Treatment Interventions DME instruction;Gait training;Functional mobility training;Therapeutic activities;Therapeutic exercise;Balance training;Patient/family education    PT Goals (Current goals can be found in the Care Plan section)  Acute Rehab PT Goals Patient Stated Goal: To get stronger and walk without pain PT Goal Formulation: With patient Time For Goal Achievement: 08/31/22 Potential to Achieve Goals: Good    Frequency Min 3X/week     Co-evaluation               AM-PAC PT "6 Clicks" Mobility  Outcome Measure Help needed turning from your back to your side while in a flat bed without using bedrails?: A Little Help needed moving from lying on your back to sitting on the side of a flat bed without using bedrails?: A Little Help needed moving to and from a bed to a chair (including a wheelchair)?: A Little Help needed standing up from a chair using your arms (e.g., wheelchair or bedside chair)?: A Little Help needed to walk in hospital room?: A Little Help needed climbing 3-5 steps with a railing? :  A Lot 6 Click Score: 17    End of Session Equipment Utilized During Treatment: Gait belt Activity Tolerance: Patient limited by pain Patient left: in chair;with family/visitor present;with call bell/phone within reach Nurse Communication: Mobility status;Other (comment) (Pt left in recliner, no chair alarm needed per nursing) PT Visit Diagnosis: Difficulty in walking, not elsewhere classified (R26.2);Muscle weakness (generalized) (M62.81);Pain Pain - Right/Left: Left Pain - part of body: Leg    Time: 1610-9604 PT Time Calculation (min) (ACUTE ONLY): 31 min   Charges:   PT Evaluation $PT Eval Moderate Complexity: 1 Mod PT Treatments $Gait Training: 8-22 mins      D. Elly Modena PT, DPT 08/18/22, 3:46 PM

## 2022-08-18 NOTE — TOC Initial Note (Signed)
Transition of Care [facility]) - Initial/Assessment Note    Patient Details  Name: Brianna Ashley MRN: [id number] Date of Birth: October 17, 1985  [facility] CM/SW Contact:    Margarito Liner, LCSW Phone Number: 08/18/2022, 12:21 PM  Clinical Narrative:    CSW met with patient. Friend at bedside. CSW introduced role and inquired about needs. Patient requesting assistance getting her medications. Patient now has Medicaid. Will ask pharmacy if she is eligible to get her medications at our outpatient pharmacy at discharge. Patient has not received her card. Will print of facesheet for her to take with her at discharge since it has her Medicaid information on it. CSW encouraged her to reach out to DSS if she has not received the card in a week or so. Patient is unsure where they will be going at discharge. Printed off [employer] which has phone number for local shelter. CSW encouraged her to call them to coordinate assessment for admittance. Also printed off information for [facility] in [place] and information on setting up [employer]. SDOH flag for food, housing and transportation. Will add resources to AVS. No further concerns. CSW encouraged patient to contact CSW as needed. CSW will continue to follow patient for support and facilitate discharge once medically stable.  Expected Discharge Plan: Homeless Shelter Barriers to Discharge: Continued Medical Work up   Patient Goals and CMS Choice            Expected Discharge Plan and Services     Post Acute Care Choice: NA                                        Prior Living Arrangements/Services     Patient language and need for interpreter reviewed:: Yes        Need for Family Participation in Patient Care: Yes (Comment) Care giver support system in place?: Yes (comment)   Criminal Activity/Legal Involvement Pertinent to Current Situation/Hospitalization: No - Comment as needed  Activities  of Daily Living Home Assistive Devices/Equipment: None ADL Screening (condition at time of admission) Patient's cognitive ability adequate to safely complete daily activities?: Yes Is the patient deaf or have difficulty hearing?: No Does the patient have difficulty seeing, even when wearing glasses/contacts?: Yes Does the patient have difficulty concentrating, remembering, or making decisions?: Yes Patient able to express need for assistance with ADLs?: Yes Does the patient have difficulty dressing or bathing?: No Independently performs ADLs?: Yes (appropriate for developmental age) Does the patient have difficulty walking or climbing stairs?: Yes Weakness of Legs: Both Weakness of Arms/Hands: None  Permission Sought/Granted Permission sought to share information with : Family Supports    Share Information with NAME: Pryor Ochoa     Permission granted to share info w Relationship: Friend  Permission granted to share info w Contact Information: [phone]  Emotional Assessment Appearance:: Appears stated age Attitude/Demeanor/Rapport: Engaged Affect (typically observed): Calm Orientation: : Oriented to Self, Oriented to Place, Oriented to  Time, Oriented to Situation Alcohol / Substance Use: Not Applicable Psych Involvement: No (comment)  Admission diagnosis:  DVT (deep venous thrombosis) (HCC) [I82.409] Dental infection [K04.7] DVT, lower extremity, recurrent, 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), [place] - [address] [place]) [place] [address] Phone: [phone] Fax: [phone]  [facility] [address] [place] [address] Phone: [phone] Fax: [phone]     Social Determinants of Health (SDOH) Social History: SDOH Screenings   Food Insecurity: Food Insecurity Present (08/18/2022)  Housing: High Risk (08/18/2022)  Transportation Needs: Unmet Transportation Needs (08/18/2022)  Utilities: Not At Risk (08/18/2022)  Tobacco Use: High Risk (08/17/2022)   SDOH Interventions:     Readmission Risk Interventions     No data to display

## 2022-08-19 ENCOUNTER — Other Ambulatory Visit: Payer: Self-pay

## 2022-08-19 DIAGNOSIS — I82403 Acute embolism and thrombosis of unspecified deep veins of lower extremity, bilateral: Secondary | ICD-10-CM

## 2022-08-19 DIAGNOSIS — D6851 Activated protein C resistance: Secondary | ICD-10-CM | POA: Diagnosis not present

## 2022-08-19 DIAGNOSIS — Z720 Tobacco use: Secondary | ICD-10-CM | POA: Diagnosis not present

## 2022-08-19 DIAGNOSIS — K029 Dental caries, unspecified: Secondary | ICD-10-CM | POA: Diagnosis not present

## 2022-08-19 DIAGNOSIS — R531 Weakness: Secondary | ICD-10-CM

## 2022-08-19 LAB — HEMOGLOBIN A1C
Hgb A1c MFr Bld: 5.6 % (ref 4.8–5.6)
Mean Plasma Glucose: 114 mg/dL

## 2022-08-19 MED ORDER — APIXABAN 5 MG PO TABS
ORAL_TABLET | ORAL | 0 refills | Status: DC
  Filled 2022-08-19: qty 72, 30d supply, fill #0

## 2022-08-19 MED ORDER — ALBUTEROL SULFATE HFA 108 (90 BASE) MCG/ACT IN AERS
2.0000 | INHALATION_SPRAY | Freq: Four times a day (QID) | RESPIRATORY_TRACT | 0 refills | Status: DC | PRN
  Filled 2022-08-19: qty 18, 25d supply, fill #0

## 2022-08-19 MED ORDER — ACETAMINOPHEN 325 MG PO TABS: 650.0000 mg | ORAL_TABLET | Freq: Four times a day (QID) | ORAL | Status: DC | PRN

## 2022-08-19 MED ORDER — HYDROCODONE-ACETAMINOPHEN 5-325 MG PO TABS
1.0000 | ORAL_TABLET | Freq: Four times a day (QID) | ORAL | 0 refills | Status: DC | PRN
  Filled 2022-08-19: qty 20, 5d supply, fill #0

## 2022-08-19 MED ORDER — ALBUTEROL SULFATE (2.5 MG/3ML) 0.083% IN NEBU: 2.5000 mg | INHALATION_SOLUTION | Freq: Four times a day (QID) | RESPIRATORY_TRACT | Status: DC | PRN

## 2022-08-19 MED ORDER — AMOXICILLIN-POT CLAVULANATE 875-125 MG PO TABS
1.0000 | ORAL_TABLET | Freq: Two times a day (BID) | ORAL | 0 refills | Status: AC
  Filled 2022-08-19: qty 24, 12d supply, fill #0

## 2022-08-19 NOTE — Progress Notes (Addendum)
Progress Note   Patient: Brianna Ashley ZOX:[id number] DOB: 11/02/1985 DOA: 08/17/2022     0 DOS: the patient was seen and examined on 08/19/2022   Brief hospital course: 37 year old female coming in with bilateral leg swelling and pain and difficulty ambulating.  Also has pain in her upper gum and poor dentition.  Patient has a history of factor V Leiden deficiency and has been off her Eliquis for at least 2 weeks.  Ultrasound bilateral lower extremities showed occlusive DVT within the distal right popliteal vein, posterior tibial vein and right peroneal vein and nonocclusive DVT within the left femoral and popliteal vein.  Case discussed with vascular surgery Dr. Myra Gianotti and recommended compression hose and anticoagulation lifelong.  CT scan maxillofacial uses extensive dental caries with periapical lucencies.  No abscess seen.  5/28.  PT recommending rehab.  TOC looking into options.  Patient with difficulty ambulating secondary to pain.  Albuterol as needed.  Assessment and Plan: * DVT (deep venous thrombosis) (HCC) Bilateral DVT.  Case discussed with vascular surgery on-call over the weekend and recommends lifelong anticoagulation and TED hose.  Switched over to Eliquis last night.  Will have to prescribe medications into [facility] community pharmacy.   Factor V Leiden (HCC) Will need lifelong anticoagulation.  Dental caries Patient also has upper lip pain.  Continue Augmentin.  With poor dentition would recommend [facility] dental clinic.  Tobacco abuse Nicotine patch  Seizures (HCC) Seizure precautions.  Not on any seizure medication.    Weakness Patient with difficulty getting around secondary to pain in lower extremities.  Physical therapy recommending rehab.  TOC to look into options.  Normocytic anemia Hemoglobin 10.9        Subjective: Patient having difficulty ambulating secondary to pain.  Physical therapy recommending rehab.  Patient has a history of factor V Leiden  deficiency came in with sliding and pain in bilateral lower extremities and found to have bilateral lower extremity DVT.  Physical Exam: Vitals:   08/18/22 1944 08/19/22 0443 08/19/22 0500 08/19/22 0851  BP: 134/60 115/65  (!) 113/55  Pulse: 62 63  (!) 57  Resp: 20 16  18   Temp: 98.2 F (36.8 C) 98.2 F (36.8 C)  97.9 F (36.6 C)  TempSrc: Oral Oral    SpO2: 99% 96%  95%  Weight:   98.2 kg   Height:       Physical Exam HENT:     Head: Normocephalic.     Mouth/Throat:     Pharynx: No oropharyngeal exudate.     Comments: Swelling and pain underneath the top lip Eyes:     General: Lids are normal.     Conjunctiva/sclera: Conjunctivae normal.  Cardiovascular:     Rate and Rhythm: Normal rate and regular rhythm.     Heart sounds: Normal heart sounds, S1 normal and S2 normal.  Pulmonary:     Breath sounds: Examination of the right-lower field reveals decreased breath sounds. Examination of the left-lower field reveals decreased breath sounds. Decreased breath sounds present. No wheezing, rhonchi or rales.  Abdominal:     Palpations: Abdomen is soft.     Tenderness: There is no abdominal tenderness.  Musculoskeletal:     Right lower leg: Swelling present.     Left lower leg: Swelling present.  Skin:    General: Skin is warm.     Findings: No rash.  Neurological:     Mental Status: She is alert and oriented to person, place, and time.  Data Reviewed: No new data today  Disposition: Status is: Observation Physical therapy recommending rehab.  Not a candidate for CIR.  TOC looking into rehab options.  Continue working with physical therapy while here.  Planned Discharge Destination: Now to be determined    Time spent: 28 minutes Case discussed with PT, OT and TOC.  Author: Alford Highland, MD 08/19/2022 3:32 PM  For on call review [email or web address].

## 2022-08-19 NOTE — Assessment & Plan Note (Signed)
Patient with difficulty getting around secondary to pain in lower extremities.  Physical therapy recommending rehab.  TOC to look into options.

## 2022-08-19 NOTE — [place] FL2 (Signed)
[place] MEDICAID FL2 LEVEL OF CARE FORM     IDENTIFICATION  Patient Name: Brianna Ashley Birthdate: January 05, 1986 Sex: female Admission Date (Current Location): 08/17/2022  [facility] and [place] Number:  Chiropodist and Address:  [facility], [address], [place], [place] [address]      Provider Number: [id number]  Attending Physician Name and Address:  Alford Highland, MD  Relative Name and Phone Number:       Current Level of Care: Hospital Recommended Level of Care: Skilled Nursing Facility Prior Approval Number:    Date Approved/Denied:   PASRR Number: [id number]  Discharge Plan: SNF    Current Diagnoses: Patient Active Problem List   Diagnosis Date Noted   Dental caries 08/18/2022   Normocytic anemia 08/18/2022   Tobacco abuse 08/17/2022   Seizures (HCC) 08/17/2022   Peri-rectal abscess 09/18/2021   Factor V Leiden (HCC) 03/28/2016   Lupus anticoagulant positive 03/28/2016   DVT (deep venous thrombosis) (HCC) 02/02/2016    Orientation RESPIRATION BLADDER Height & Weight     Self, Time, Situation, Place  Normal Continent Weight: 216 lb 7.9 oz (98.2 kg) Height:  5\' 5"  (165.1 cm)  BEHAVIORAL SYMPTOMS/MOOD NEUROLOGICAL BOWEL NUTRITION STATUS   (None)  (History of seizures) Continent Diet (Heart healthy)  AMBULATORY STATUS COMMUNICATION OF NEEDS Skin   Limited Assist Verbally Skin abrasions                       Personal Care Assistance Level of Assistance  Bathing, Feeding, Dressing Bathing Assistance: Limited assistance Feeding assistance: Limited assistance Dressing Assistance: Limited assistance     Functional Limitations Info  Sight, Hearing, Speech Sight Info: Adequate Hearing Info: Adequate Speech Info: Adequate    SPECIAL CARE FACTORS FREQUENCY  PT (By licensed PT), OT (By licensed OT)     PT Frequency: 5 x week OT Frequency: 5 x week            Contractures Contractures Info: Not present     Additional Factors Info  Code Status, Allergies Code Status Info: Full code Allergies Info: Ambien (Zolpidem Tartrate), Ambien (Zolpidem Tartrate), Clindamycin/lincomycin, Keflex (Cephalexin), Methylphenidate, Ritalin (Methylphenidate Hcl), Ritalin (Methylphenidate Hcl), Zolpidem           Current Medications (08/19/2022):  This is the current hospital active medication list Current Facility-Administered Medications  Medication Dose Route Frequency Provider Last Rate Last Admin   acetaminophen (TYLENOL) tablet 650 mg  650 mg Oral Q6H PRN Gertha Calkin, MD       Or   acetaminophen (TYLENOL) suppository 650 mg  650 mg Rectal Q6H PRN Gertha Calkin, MD       albuterol (PROVENTIL) (2.5 MG/3ML) 0.083% nebulizer solution 2.5 mg  2.5 mg Inhalation Q6H PRN Alford Highland, MD       amoxicillin-clavulanate (AUGMENTIN) 875-125 MG per tablet 1 tablet  1 tablet Oral BID Dionne Bucy, MD   1 tablet at 08/19/22 5621   apixaban (ELIQUIS) tablet 10 mg  10 mg Oral BID Orson Aloe, RPH   10 mg at 08/19/22 3086   Followed by   Melene Muller ON 08/25/2022] apixaban (ELIQUIS) tablet 5 mg  5 mg Oral BID Orson Aloe, [facility]       HYDROcodone-acetaminophen (NORCO/VICODIN) 5-325 MG per tablet 1 tablet  1 tablet Oral Q4H PRN Gertha Calkin, MD   1 tablet at 08/19/22 1114   morphine (PF) 2 MG/ML injection 2 mg  2 mg Intravenous  Q4H PRN Gertha Calkin, MD   2 mg at 08/19/22 1610   nicotine (NICODERM CQ - dosed in mg/24 hours) patch 21 mg  21 mg Transdermal Daily Irena Cords V, MD   21 mg at 08/19/22 0929   sodium chloride flush (NS) 0.9 % injection 3 mL  3 mL Intravenous Q12H Gertha Calkin, MD   3 mL at 08/19/22 0930     Discharge Medications: Please see discharge summary for a list of discharge medications.  Relevant Imaging Results:  Relevant Lab Results:   Additional Information SS#: [id number]. Patient is only willing to go to SNF if her husband can stay with her 24/7.  Margarito Liner,  LCSW

## 2022-08-19 NOTE — Evaluation (Addendum)
Occupational Therapy Evaluation Patient Details Name: Brianna Ashley MRN: [id number] DOB: October 24, 1985 Today's Date: 08/19/2022   History of Present Illness Per MD assessment: Pt is a 37 year old female coming in with bilateral leg swelling and pain and difficulty ambulating.  Patient has a history of factor V Leiden deficiency and has been off her Eliquis for at least 2 weeks.  Ultrasound bilateral lower extremities showed occlusive DVT within the distal right popliteal vein, posterior tibial vein and right peroneal vein and nonocclusive DVT within the left femoral and popliteal vein.  Case discussed with vascular surgery Dr. Myra Gianotti and recommended compression hose and anticoagulation lifelong.   Clinical Impression   [name] was seen for OT evaluation this date. Prior to hospital admission, pt was generally independent with ADL management, however, she reports requiring recently requiring increased assistance from her significant other for functional mobility in the community 2/2 BLE pain. Pt currently living in a shelter with limited family supports in the area. Pt presents to acute OT demonstrating impaired ADL performance and functional mobility 2/2 increased pain in BLE and generalized weakness (See OT problem list for additional functional deficits). Pt currently requires CGA for LB dressing including to don compression stockings at EOB as well as CGA for safety for short functional transfers. Pt would benefit from skilled OT services to address noted impairments and functional limitations (see below for any additional details) in order to maximize safety and independence while minimizing falls risk and caregiver burden.   Patient suffers from DVT's and pain which impairs his/her ability to perform daily activities like toileting, feeding, dressing, grooming, bathing in the home. A cane, walker, crutch will not resolve the patient's issue with performing activities of daily living. A  lightweight wheelchair and cushion is required/recommended and will allow patient to safely perform daily activities.   Patient can safely propel the wheelchair in the home or has a caregiver who can provide assistance.      Recommendations for follow up therapy are one component of a multi-disciplinary discharge planning process, led by the attending physician.  Recommendations may be updated based on patient status, additional functional criteria and insurance authorization.   Assistance Recommended at Discharge Intermittent Supervision/Assistance  Patient can return home with the following A little help with walking and/or transfers;A little help with bathing/dressing/bathroom;Assist for transportation    Functional Status Assessment  Patient has had a recent decline in their functional status and demonstrates the ability to make significant improvements in function in a reasonable and predictable amount of time.  Equipment Recommendations  Other (comment);Wheelchair (measurements OT) (2WW)    Recommendations for Other Services       Precautions / Restrictions Precautions Precautions: Fall Restrictions Weight Bearing Restrictions: No Other Position/Activity Restrictions: Seizure precautions; ok for pt to be in recliner per Dr. Renae Gloss      Mobility Bed Mobility Overal bed mobility: Modified Independent             General bed mobility comments: Min extra time and effort and use of bed rails    Transfers Overall transfer level: Needs assistance Equipment used: Rolling walker (2 wheels) Transfers: Sit to/from Stand Sit to Stand: Min guard, Supervision           General transfer comment: Extra time and effort along with UE assist to come to standing      Balance Overall balance assessment: Needs assistance Sitting-balance support: Feet supported Sitting balance-Leahy Scale: Normal     Standing balance support: Bilateral upper extremity  supported, During  functional activity Standing balance-Leahy Scale: Good                             ADL either performed or assessed with clinical judgement   ADL Overall ADL's : Needs assistance/impaired                                       General ADL Comments: Pt requires SBA for safety to don underwear from STS and BLE compression stockings. She is functionally limited by generalized weakness and increased pain in BLE with RLE>LLE today.     Vision Patient Visual Report: No change from baseline       Perception     Praxis      Pertinent Vitals/Pain Pain Assessment Pain Assessment: 0-10 Pain Score: 6  Pain Location: BLE with L>R Pain Descriptors / Indicators: Sore, Aching Pain Intervention(s): Limited activity within patient's tolerance, Monitored during session, Premedicated before session, Repositioned     Hand Dominance Right   Extremity/Trunk Assessment Upper Extremity Assessment Upper Extremity Assessment: Generalized weakness   Lower Extremity Assessment Lower Extremity Assessment: Generalized weakness       Communication Communication Communication: No difficulties   Cognition Arousal/Alertness: Awake/alert Behavior During Therapy: WFL for tasks assessed/performed Overall Cognitive Status: Within Functional Limits for tasks assessed                                       General Comments       Exercises Other Exercises Other Exercises: Pt/caregiver educated on role of OT in acute setting, safe use of AE/DME for ADL management, compression stocking management, safe transfer techniques, and routines modifications to support safety and functional independence during ADL management.   Shoulder Instructions      Home Living Family/patient expects to be discharged to:: Shelter/Homeless Living Arrangements: Spouse/significant other                               Additional Comments: Pt had been alternating  staying with friends and family prior to admission but not an option going forward, currently homeless      Prior Functioning/Environment Prior Level of Function : Needs assist             Mobility Comments: HHA with ambulation limited community distances, 2 falls in the last 6 months secondary to LE buckling, primarily on the LLE          OT Problem List: Decreased activity tolerance;Decreased safety awareness;Impaired balance (sitting and/or standing);Decreased knowledge of use of DME or AE;Pain      OT Treatment/Interventions: Self-care/ADL training;Therapeutic exercise;Therapeutic activities;DME and/or AE instruction;Patient/family education;Balance training    OT Goals(Current goals can be found in the care plan section) Acute Rehab OT Goals Patient Stated Goal: To feel better OT Goal Formulation: With patient Time For Goal Achievement: 09/02/22 Potential to Achieve Goals: Good  OT Frequency: Min 2X/week    Co-evaluation              AM-PAC OT "6 Clicks" Daily Activity     Outcome Measure Help from another person eating meals?: None Help from another person taking care of personal grooming?: None Help from another person toileting, which includes using  toliet, bedpan, or urinal?: A Little Help from another person bathing (including washing, rinsing, drying)?: A Little Help from another person to put on and taking off regular upper body clothing?: None Help from another person to put on and taking off regular lower body clothing?: A Little 6 Click Score: 21   End of Session Equipment Utilized During Treatment: Gait belt;Rolling walker (2 wheels)  Activity Tolerance: Patient tolerated treatment well Patient left: Other (comment) (standing EOB with PTA in room to begin session.)  OT Visit Diagnosis: Other abnormalities of gait and mobility (R26.89);Muscle weakness (generalized) (M62.81)                Time: 8119-1478 OT Time Calculation (min): 25 min Charges:   OT General Charges $OT Visit: 1 Visit OT Evaluation $OT Eval Moderate Complexity: 1 Mod OT Treatments $Self Care/Home Management : 8-22 mins  Rockney Ghee, M.S., OTR/L 08/19/22, 12:00 PM

## 2022-08-19 NOTE — TOC Progression Note (Signed)
Transition of Care [facility]) - Progression Note    Patient Details  Name: Brianna Ashley MRN: [id number] Date of Birth: 02-Jun-1985  Transition of [facility]) CM/SW Contact  Margarito Liner, LCSW Phone Number: 08/19/2022, 11:21 AM  Clinical Narrative:   Patient does not meet medical necessity for CIR. Patient is agreeable to SNF if her husband can stay with her 24/7. [place]. CSW sent referral to [place] and surrounding counties. If patient discharges back to the community, she is agreeable to a wheelchair.  Expected Discharge Plan: Homeless Shelter Barriers to Discharge: Continued Medical Work up  Expected Discharge Plan and Services     Post Acute Care Choice: NA                                         Social Determinants of Health (SDOH) Interventions SDOH Screenings   Food Insecurity: Food Insecurity Present (08/18/2022)  Housing: High Risk (08/18/2022)  Transportation Needs: Unmet Transportation Needs (08/18/2022)  Utilities: Not At Risk (08/18/2022)  Tobacco Use: High Risk (08/17/2022)    Readmission Risk Interventions     No data to display

## 2022-08-19 NOTE — Progress Notes (Addendum)
Physical Therapy Treatment Patient Details Name: Brianna Ashley MRN: [id number] DOB: [birth year] Today's Date: 08/19/2022   History of Present Illness Per MD assessment: Pt is a 37 year old female coming in with bilateral leg swelling and pain and difficulty ambulating.  Patient has a history of factor V Leiden deficiency and has been off her Eliquis for at least 2 weeks.  Ultrasound bilateral lower extremities showed occlusive DVT within the distal right popliteal vein, posterior tibial vein and right peroneal vein and nonocclusive DVT within the left femoral and popliteal vein.  Case discussed with vascular surgery Dr. Myra Gianotti and recommended compression hose and anticoagulation lifelong.    PT Comments    Pt finishing with OT upon arrival.  She is sitting EOB and gait is initiated.  Stood with min a x 1.  She struggles to walk around bed due to pain.  She is able to walk 10' with RW with heavy reliance on RW but no physical assist.  She does put in good effort but pain is primary limiting factor despite recently receiving morphine.  Discussed discharge plan with pt.  Unsure of housing situation as they are not forthcoming with information but likely unhoused.  She does have a supportive and involved partner, who she refers to as her husband.  TOC to see if CIR is an option for her as her mobility is quite limited at this time.  If CIR is not an option she would benefit from a RW and wheelchair for mobility.  SNF is not an option for pt.   Discussed with MD, TOC  and OT.   Patient suffers from DVT's and pain which impairs his/her ability to perform daily activities like toileting, feeding, dressing, grooming, bathing in the home. A cane, walker, crutch will not resolve the patient's issue with performing activities of daily living. A lightweight wheelchair and cushion is required/recommended and will allow patient to safely perform daily activities.   Patient can safely propel the wheelchair in  the home or has a caregiver who can provide assistance.     Recommendations for follow up therapy are one component of a multi-disciplinary discharge planning process, led by the attending physician.  Recommendations may be updated based on patient status, additional functional criteria and insurance authorization.  Follow Up Recommendations  Can patient physically be transported by private vehicle: Yes    Assistance Recommended at Discharge Intermittent Supervision/Assistance  Patient can return home with the following A little help with walking and/or transfers;A little help with bathing/dressing/bathroom;Assistance with cooking/housework;Assist for transportation;Help with stairs or ramp for entrance   Equipment Recommendations  Rolling walker (2 wheels)    Recommendations for Other Services       Precautions / Restrictions Precautions Precautions: Fall Restrictions Weight Bearing Restrictions: No Other Position/Activity Restrictions: Seizure precautions; ok for pt to be in recliner per Dr. Renae Gloss     Mobility  Bed Mobility Overal bed mobility: Modified Independent                  Transfers Overall transfer level: Needs assistance Equipment used: Rolling walker (2 wheels) Transfers: Sit to/from Stand Sit to Stand: Min guard                Ambulation/Gait Ambulation/Gait assistance: Min guard Gait Distance (Feet): 10 Feet Assistive device: Rolling walker (2 wheels) Gait Pattern/deviations: Step-to pattern, Antalgic, Decreased stance time - left, Decreased step length - right Gait velocity: decreased     General Gait Details: heavy reliance on RW  for support, limited by pain   Stairs             Wheelchair Mobility    Modified Rankin (Stroke Patients Only)       Balance Overall balance assessment: Needs assistance Sitting-balance support: Feet supported Sitting balance-Leahy Scale: Normal     Standing balance support: Bilateral  upper extremity supported, During functional activity Standing balance-Leahy Scale: Good                              Cognition Arousal/Alertness: Awake/alert Behavior During Therapy: WFL for tasks assessed/performed Overall Cognitive Status: Within Functional Limits for tasks assessed                                          Exercises      General Comments        Pertinent Vitals/Pain Pain Assessment Pain Assessment: Faces Faces Pain Scale: Hurts whole lot Pain Location: BLE with L>R Pain Descriptors / Indicators: Sore, Aching Pain Intervention(s): Limited activity within patient's tolerance, Monitored during session, Premedicated before session, Repositioned    Home Living Family/patient expects to be discharged to:: Shelter/Homeless Living Arrangements: Spouse/significant other                      Prior Function            PT Goals (current goals can now be found in the care plan section) Progress towards PT goals: Progressing toward goals    Frequency    Min 3X/week      PT Plan      Co-evaluation              AM-PAC PT "6 Clicks" Mobility   Outcome Measure  Help needed turning from your back to your side while in a flat bed without using bedrails?: None Help needed moving from lying on your back to sitting on the side of a flat bed without using bedrails?: None Help needed moving to and from a bed to a chair (including a wheelchair)?: A Little Help needed standing up from a chair using your arms (e.g., wheelchair or bedside chair)?: A Little Help needed to walk in hospital room?: A Little Help needed climbing 3-5 steps with a railing? : A Lot 6 Click Score: 19    End of Session Equipment Utilized During Treatment: Gait belt Activity Tolerance: Patient limited by pain Patient left: in chair;with family/visitor present;with call bell/phone within reach Nurse Communication: Mobility status;Other (comment)  (Pt left in recliner, no chair alarm needed per nursing) PT Visit Diagnosis: Difficulty in walking, not elsewhere classified (R26.2);Muscle weakness (generalized) (M62.81);Pain Pain - Right/Left: Left Pain - part of body: Leg     Time: 4098-1191 PT Time Calculation (min) (ACUTE ONLY): 9 min  Charges:  $Gait Training: 8-22 mins                   Danielle Dess, PTA 08/19/22, 10:52 AM

## 2022-08-20 DIAGNOSIS — I824Y3 Acute embolism and thrombosis of unspecified deep veins of proximal lower extremity, bilateral: Secondary | ICD-10-CM | POA: Diagnosis not present

## 2022-08-20 DIAGNOSIS — D6851 Activated protein C resistance: Secondary | ICD-10-CM | POA: Diagnosis not present

## 2022-08-20 DIAGNOSIS — K047 Periapical abscess without sinus: Secondary | ICD-10-CM

## 2022-08-20 DIAGNOSIS — R569 Unspecified convulsions: Secondary | ICD-10-CM | POA: Diagnosis not present

## 2022-08-20 LAB — CBC
HCT: 35.4 % — ABNORMAL LOW (ref 36.0–46.0)
Hemoglobin: 11.9 g/dL — ABNORMAL LOW (ref 12.0–15.0)
MCH: 29.5 pg (ref 26.0–34.0)
MCHC: 33.6 g/dL (ref 30.0–36.0)
MCV: 87.8 fL (ref 80.0–100.0)
Platelets: 289 10*3/uL (ref 150–400)
RBC: 4.03 MIL/uL (ref 3.87–5.11)
RDW: 13.9 % (ref 11.5–15.5)
WBC: 6.3 10*3/uL (ref 4.0–10.5)
nRBC: 0 % (ref 0.0–0.2)

## 2022-08-20 LAB — BASIC METABOLIC PANEL
Anion gap: 7 (ref 5–15)
BUN: 19 mg/dL (ref 6–20)
CO2: 27 mmol/L (ref 22–32)
Calcium: 8.9 mg/dL (ref 8.9–10.3)
Chloride: 104 mmol/L (ref 98–111)
Creatinine, Ser: 0.95 mg/dL (ref 0.44–1.00)
GFR, Estimated: >60 mL/min (ref >60)
Glucose, Bld: 97 mg/dL (ref 70–99)
Potassium: 4.2 mmol/L (ref 3.5–5.1)
Sodium: 138 mmol/L (ref 135–145)

## 2022-08-20 MED ORDER — LORAZEPAM 1 MG PO TABS
1.0000 mg | ORAL_TABLET | Freq: Once | ORAL | Status: AC
  Administered 2022-08-20: 1 mg via ORAL
  Filled 2022-08-20: qty 1

## 2022-08-20 NOTE — Progress Notes (Signed)
Mobility Specialist - Progress Note   08/20/22 1156  Mobility  Activity Refused mobility  Mobility Specialist Start Time (ACUTE ONLY) 1140  Mobility Specialist Stop Time (ACUTE ONLY) 1155  Mobility Specialist Time Calculation (min) (ACUTE ONLY) 15 min   Pt side lying upon entry, utilizing RA. Pt denied OOB amb this date, states "I'm in pain, my legs hurt". Pt left supine with needs within reach.   Zetta Bills Mobility Specialist 08/20/22 11:59 AM

## 2022-08-20 NOTE — Progress Notes (Signed)
  Progress Note   Patient: Brianna Ashley UYQ:[id number] DOB: 1986/03/10 DOA: 08/17/2022     0 DOS: the patient was seen and examined on 08/20/2022   Brief hospital course: 37 year old female coming in with bilateral leg swelling and pain and difficulty ambulating.  Also has pain in her upper gum and poor dentition.  Patient has a history of factor V Leiden deficiency and has been off her Eliquis for at least 2 weeks.  Ultrasound bilateral lower extremities showed occlusive DVT within the distal right popliteal vein, posterior tibial vein and right peroneal vein and nonocclusive DVT within the left femoral and popliteal vein.  Case discussed with vascular surgery Dr. Myra Gianotti and recommended compression hose and anticoagulation lifelong.  CT scan maxillofacial uses extensive dental caries with periapical lucencies.  No abscess seen.  5/28.  PT recommending rehab.  TOC looking into options.  Patient with difficulty ambulating secondary to pain.  Albuterol as needed. 5/29: Continue to have bilateral leg pain which interfere with ambulation.  Awaiting SNF placement.  Assessment and Plan: * DVT (deep venous thrombosis) (HCC) Bilateral DVT.  Case discussed with vascular surgery on-call over the weekend and recommends lifelong anticoagulation and TED hose.    Initially received heparin followed by Eliquis Will have to prescribe medications into [facility] community pharmacy. -Will need lifelong anticoagulation   Factor V Leiden (HCC) Will need lifelong anticoagulation.  Dental caries Patient also has upper lip pain.  Continue Augmentin.  With poor dentition would recommend [facility] dental clinic.  Tobacco abuse Nicotine patch  Seizures (HCC) Seizure precautions.  Not on any seizure medication.    Weakness Patient with difficulty getting around secondary to pain in lower extremities.  Physical therapy recommending rehab.  TOC to look into options.  Normocytic anemia Hemoglobin 10.9   Subjective:  Patient continued to have significant pain with ambulation.  She is homeless  Physical Exam: Vitals:   08/20/22 0413 08/20/22 0500 08/20/22 0824 08/20/22 1543  BP: (!) 107/52  116/73 (!) 96/48  Pulse: (!) 53  64 (!) 51  Resp: 18  18 16   Temp: 98.4 F (36.9 C)  97.8 F (36.6 C) 98 F (36.7 C)  TempSrc: Oral  Oral   SpO2: 98%  100% 98%  Weight:  96.7 kg    Height:       General.  Obese lady, in no acute distress. Pulmonary.  Lungs clear bilaterally, normal respiratory effort. CV.  Regular rate and rhythm, no JVD, rub or murmur. Abdomen.  Soft, nontender, nondistended, BS positive. CNS.  Alert and oriented .  No focal neurologic deficit. Extremities.  No edema, no cyanosis, pulses intact and symmetrical. Psychiatry.  Judgment and insight appears normal.   Data Reviewed: Prior data reviewed  Family Communication:   Disposition: Status is: Observation The patient remains OBS appropriate and will d/c before 2 midnights.  Planned Discharge Destination: Skilled nursing facility  DVT prophylaxis.  Eliquis Time spent: 38 minutes  This record has been created using Conservation officer, historic buildings. Errors have been sought and corrected,but may not always be located. Such creation errors do not reflect on the standard of care.   Author: Arnetha Courser, MD 08/20/2022 3:48 PM  For on call review [email or web address].

## 2022-08-20 NOTE — Progress Notes (Signed)
Patient is asking for something to help with anxiety. She is emotional and crying. . she said she was supposed to be on clonazepam 2mg  bid but has not been able to follow up with Dr Lacie Scotts at [facility])

## 2022-08-20 NOTE — Progress Notes (Signed)
BP 96/48, heart rate 51, MAP 61. Dr Nelson Chimes notified. She said to encourage patient with PO intake

## 2022-08-20 NOTE — Progress Notes (Signed)
Physical Therapy Treatment Patient Details Name: Brianna Ashley MRN: [id number] DOB: 1985-12-20 Today's Date: 08/20/2022   History of Present Illness Per MD assessment: Pt is a 37 year old female coming in with bilateral leg swelling and pain and difficulty ambulating.  Patient has a history of factor V Leiden deficiency and has been off her Eliquis for at least 2 weeks.  Ultrasound bilateral lower extremities showed occlusive DVT within the distal right popliteal vein, posterior tibial vein and right peroneal vein and nonocclusive DVT within the left femoral and popliteal vein.  Case discussed with vascular surgery Dr. Myra Gianotti and recommended compression hose and anticoagulation lifelong.    PT Comments    Pt is making limited progress towards goals still limited by severe pain (8/10) in B LEs (L>R). Pt premedicated prior to session and able to stand and ambulate limited distance in room. Pt with social stressors and becomes emotional throughout session. Pt is worried about hospital discharge and returning back to the streets. Will continue to progress as able.  Recommendations for follow up therapy are one component of a multi-disciplinary discharge planning process, led by the attending physician.  Recommendations may be updated based on patient status, additional functional criteria and insurance authorization.  Follow Up Recommendations  Can patient physically be transported by private vehicle: Yes    Assistance Recommended at Discharge Intermittent Supervision/Assistance  Patient can return home with the following A little help with walking and/or transfers;A little help with bathing/dressing/bathroom;Assistance with cooking/housework;Assist for transportation;Help with stairs or ramp for entrance   Equipment Recommendations  Rolling walker (2 wheels)    Recommendations for Other Services       Precautions / Restrictions Precautions Precautions: Fall Restrictions Weight Bearing  Restrictions: No     Mobility  Bed Mobility Overal bed mobility: Modified Independent             General bed mobility comments: ease of mobility although does appear to require increased effort    Transfers Overall transfer level: Needs assistance Equipment used: Rolling walker (2 wheels) Transfers: Sit to/from Stand Sit to Stand: Modified independent (Device/Increase time)           General transfer comment: extended time. Heavy use of B UE on RW    Ambulation/Gait Ambulation/Gait assistance: Supervision Gait Distance (Feet): 10 Feet Assistive device: Rolling walker (2 wheels) Gait Pattern/deviations: Step-to pattern, Antalgic, Decreased stance time - left, Decreased step length - right       General Gait Details: heavy reliance on RW with antalgic gait. LImited distance secondary to pain despite being premedicated prior to session   Stairs             Wheelchair Mobility    Modified Rankin (Stroke Patients Only)       Balance Overall balance assessment: Needs assistance Sitting-balance support: Feet supported Sitting balance-Leahy Scale: Normal     Standing balance support: Bilateral upper extremity supported, During functional activity Standing balance-Leahy Scale: Good                              Cognition Arousal/Alertness: Awake/alert Behavior During Therapy: Anxious Overall Cognitive Status: Within Functional Limits for tasks assessed                                 General Comments: tearful and emotional throughout session        Exercises Other Exercises  Other Exercises: education given regarding safe transfers. Would benefit from regular size RW to use in house Other Exercises: seated LAQ with only able to perform several reps prior to severe pain.    General Comments        Pertinent Vitals/Pain Pain Assessment Pain Assessment: 0-10 Pain Score: 8  Pain Location: B LE with L>R Pain  Descriptors / Indicators: Sore, Aching Pain Intervention(s): Limited activity within patient's tolerance, Premedicated before session, Repositioned    Home Living                          Prior Function            PT Goals (current goals can now be found in the care plan section) Acute Rehab PT Goals Patient Stated Goal: To get stronger and walk without pain PT Goal Formulation: With patient Time For Goal Achievement: 08/31/22 Potential to Achieve Goals: Good Progress towards PT goals: Progressing toward goals    Frequency    Min 3X/week      PT Plan Current plan remains appropriate    Co-evaluation              AM-PAC PT "6 Clicks" Mobility   Outcome Measure  Help needed turning from your back to your side while in a flat bed without using bedrails?: None Help needed moving from lying on your back to sitting on the side of a flat bed without using bedrails?: None Help needed moving to and from a bed to a chair (including a wheelchair)?: A Little Help needed standing up from a chair using your arms (e.g., wheelchair or bedside chair)?: A Little Help needed to walk in hospital room?: A Lot Help needed climbing 3-5 steps with a railing? : A Lot 6 Click Score: 18    End of Session Equipment Utilized During Treatment: Gait belt Activity Tolerance: Patient limited by pain Patient left: in bed Nurse Communication: Mobility status;Other (comment) PT Visit Diagnosis: Difficulty in walking, not elsewhere classified (R26.2);Muscle weakness (generalized) (M62.81);Pain Pain - Right/Left: Left Pain - part of body: Leg     Time: 7829-5621 PT Time Calculation (min) (ACUTE ONLY): 26 min  Charges:  $Gait Training: 23-37 mins                     Brianna Ashley, PT, DPT, GCS [phone]    Brianna Ashley 08/20/2022, 5:07 PM

## 2022-08-20 NOTE — Progress Notes (Signed)
Order received to discontinue telemetry 

## 2022-08-21 ENCOUNTER — Other Ambulatory Visit (HOSPITAL_COMMUNITY): Payer: Self-pay

## 2022-08-21 ENCOUNTER — Other Ambulatory Visit: Payer: Self-pay

## 2022-08-21 DIAGNOSIS — F32A Depression, unspecified: Secondary | ICD-10-CM | POA: Diagnosis present

## 2022-08-21 DIAGNOSIS — G40909 Epilepsy, unspecified, not intractable, without status epilepticus: Secondary | ICD-10-CM | POA: Diagnosis present

## 2022-08-21 DIAGNOSIS — Z751 Person awaiting admission to adequate facility elsewhere: Secondary | ICD-10-CM | POA: Diagnosis not present

## 2022-08-21 DIAGNOSIS — I82441 Acute embolism and thrombosis of right tibial vein: Secondary | ICD-10-CM | POA: Diagnosis present

## 2022-08-21 DIAGNOSIS — I82451 Acute embolism and thrombosis of right peroneal vein: Secondary | ICD-10-CM | POA: Diagnosis present

## 2022-08-21 DIAGNOSIS — K047 Periapical abscess without sinus: Secondary | ICD-10-CM | POA: Diagnosis not present

## 2022-08-21 DIAGNOSIS — R569 Unspecified convulsions: Secondary | ICD-10-CM | POA: Diagnosis not present

## 2022-08-21 DIAGNOSIS — E669 Obesity, unspecified: Secondary | ICD-10-CM | POA: Diagnosis present

## 2022-08-21 DIAGNOSIS — D649 Anemia, unspecified: Secondary | ICD-10-CM | POA: Diagnosis present

## 2022-08-21 DIAGNOSIS — F419 Anxiety disorder, unspecified: Secondary | ICD-10-CM | POA: Diagnosis present

## 2022-08-21 DIAGNOSIS — Z7901 Long term (current) use of anticoagulants: Secondary | ICD-10-CM | POA: Diagnosis not present

## 2022-08-21 DIAGNOSIS — I82412 Acute embolism and thrombosis of left femoral vein: Secondary | ICD-10-CM | POA: Diagnosis present

## 2022-08-21 DIAGNOSIS — I82409 Acute embolism and thrombosis of unspecified deep veins of unspecified lower extremity: Secondary | ICD-10-CM | POA: Diagnosis present

## 2022-08-21 DIAGNOSIS — D6851 Activated protein C resistance: Secondary | ICD-10-CM | POA: Diagnosis not present

## 2022-08-21 DIAGNOSIS — I82433 Acute embolism and thrombosis of popliteal vein, bilateral: Secondary | ICD-10-CM | POA: Diagnosis present

## 2022-08-21 DIAGNOSIS — Z888 Allergy status to other drugs, medicaments and biological substances status: Secondary | ICD-10-CM | POA: Diagnosis not present

## 2022-08-21 DIAGNOSIS — I824Y3 Acute embolism and thrombosis of unspecified deep veins of proximal lower extremity, bilateral: Secondary | ICD-10-CM | POA: Diagnosis not present

## 2022-08-21 DIAGNOSIS — K029 Dental caries, unspecified: Secondary | ICD-10-CM | POA: Diagnosis present

## 2022-08-21 DIAGNOSIS — Z832 Family history of diseases of the blood and blood-forming organs and certain disorders involving the immune mechanism: Secondary | ICD-10-CM | POA: Diagnosis not present

## 2022-08-21 DIAGNOSIS — Z5900 Homelessness unspecified: Secondary | ICD-10-CM | POA: Diagnosis not present

## 2022-08-21 DIAGNOSIS — Z720 Tobacco use: Secondary | ICD-10-CM | POA: Diagnosis not present

## 2022-08-21 DIAGNOSIS — R262 Difficulty in walking, not elsewhere classified: Secondary | ICD-10-CM | POA: Diagnosis present

## 2022-08-21 MED ORDER — ESCITALOPRAM OXALATE 10 MG PO TABS
5.0000 mg | ORAL_TABLET | Freq: Every day | ORAL | Status: DC
  Administered 2022-08-21 – 2022-08-23 (×3): 5 mg via ORAL
  Filled 2022-08-21 (×3): qty 1

## 2022-08-21 NOTE — TOC Progression Note (Addendum)
Transition of Care [facility]) - Progression Note    Patient Details  Name: Brianna Ashley MRN: [id number] Date of Birth: 27-Oct-1985  [facility] of [facility]) CM/SW Contact  Margarito Liner, LCSW Phone Number: 08/21/2022, 10:46 AM  Clinical Narrative: No bed offers so far. 29 facilities have declined. CSW reaching out to facilities that have not responded yet.    11:38 am: CSW met with patient and provided update regarding bed offers. If no one offers, she plans to discharge to the street. CSW asked about the shelter resources that were given to her on Monday. Patient stated they do not care about her. If she discharges back to the community, she would prefer to get wheelchair rather than RW. Patient reports being frustrated with the way she has been treated today. She has spoken with the director of the unit.   11:50 am: [facility] liaison will review referral for [name] in [place], [address] in [place], and [facility]. [facility] admissions coordinator will review referral. [facility], and [facility] are unable to offer. Left messages for [facility], [facility], [facility], [place], and [facility] asking them to review referral. CSW acknowledges consult about Eliquis price. Pharmacy will have it filled at our outpatient pharmacy for $4.  1:45 pm: [facility] is unable to offer a bed.  Expected Discharge Plan: Homeless Shelter Barriers to Discharge: Continued Medical Work up  Expected Discharge Plan and Services     Post Acute Care Choice: NA                                         Social Determinants of Health (SDOH) Interventions SDOH Screenings   Food Insecurity: Food Insecurity Present (08/18/2022)  Housing: High Risk (08/18/2022)  Transportation Needs: Unmet Transportation Needs (08/18/2022)  Utilities: Not At Risk (08/18/2022)  Tobacco Use: High Risk (08/17/2022)    Readmission Risk Interventions     No  data to display

## 2022-08-21 NOTE — Progress Notes (Signed)
Physical Therapy Treatment Patient Details Name: Brianna Ashley MRN: [id number] DOB: [birth year] Today's Date: 08/21/2022   History of Present Illness Per MD assessment: Pt is a 37 year old female coming in with bilateral leg swelling and pain and difficulty ambulating.  Patient has a history of factor V Leiden deficiency and has been off her Eliquis for at least 2 weeks.  Ultrasound bilateral lower extremities showed occlusive DVT within the distal right popliteal vein, posterior tibial vein and right peroneal vein and nonocclusive DVT within the left femoral and popliteal vein.  Case discussed with vascular surgery Dr. Myra Gianotti and recommended compression hose and anticoagulation lifelong.    PT Comments    Pt is making gradual progress towards goals with improved ability to ambulate in room with adjusted walker. Pt reports pain, however doesn't seem limited. Pt denies further ambulation at this time. Will continue to progress as able.   Recommendations for follow up therapy are one component of a multi-disciplinary discharge planning process, led by the attending physician.  Recommendations may be updated based on patient status, additional functional criteria and insurance authorization.  Follow Up Recommendations  Can patient physically be transported by private vehicle: Yes    Assistance Recommended at Discharge Intermittent Supervision/Assistance  Patient can return home with the following A little help with walking and/or transfers;A little help with bathing/dressing/bathroom;Assistance with cooking/housework;Assist for transportation;Help with stairs or ramp for entrance   Equipment Recommendations  Rolling walker (2 wheels)    Recommendations for Other Services       Precautions / Restrictions Precautions Precautions: Fall Restrictions Weight Bearing Restrictions: No     Mobility  Bed Mobility Overal bed mobility: Modified Independent             General bed  mobility comments: ease of mobility. Able to sit at EOB with upright posture    Transfers Overall transfer level: Modified independent Equipment used: Rolling walker (2 wheels) Transfers: Sit to/from Stand Sit to Stand: Modified independent (Device/Increase time)           General transfer comment: decreased effort noted. UPright posture and RW used. Adjusted RW to correct height    Ambulation/Gait Ambulation/Gait assistance: Supervision Gait Distance (Feet): 30 Feet Assistive device: Rolling walker (2 wheels) Gait Pattern/deviations: Step-to pattern, Antalgic, Decreased stance time - left, Decreased step length - right       General Gait Details: Improved ambulation in room using RW. Self limiting secondary to pain.   Stairs             Wheelchair Mobility    Modified Rankin (Stroke Patients Only)       Balance Overall balance assessment: Needs assistance Sitting-balance support: Feet supported Sitting balance-Leahy Scale: Normal     Standing balance support: Bilateral upper extremity supported, During functional activity Standing balance-Leahy Scale: Good                              Cognition Arousal/Alertness: Awake/alert Behavior During Therapy: Anxious Overall Cognitive Status: Within Functional Limits for tasks assessed                                 General Comments: sleeping upon arrival, however easily awakens. Pt agreeable to session        Exercises Other Exercises Other Exercises: ambulated to bathroom. Able to performed self hygiene with mod I.    General Comments  Pertinent Vitals/Pain Pain Assessment Pain Assessment: Faces Faces Pain Scale: Hurts little more Pain Location: B LE with L>R Pain Descriptors / Indicators: Sore, Aching Pain Intervention(s): Limited activity within patient's tolerance, Repositioned    Home Living                          Prior Function             PT Goals (current goals can now be found in the care plan section) Acute Rehab PT Goals Patient Stated Goal: To get stronger and walk without pain PT Goal Formulation: With patient Time For Goal Achievement: 08/31/22 Potential to Achieve Goals: Good Progress towards PT goals: Progressing toward goals    Frequency    Min 3X/week      PT Plan Current plan remains appropriate    Co-evaluation              AM-PAC PT "6 Clicks" Mobility   Outcome Measure  Help needed turning from your back to your side while in a flat bed without using bedrails?: None Help needed moving from lying on your back to sitting on the side of a flat bed without using bedrails?: None Help needed moving to and from a bed to a chair (including a wheelchair)?: None Help needed standing up from a chair using your arms (e.g., wheelchair or bedside chair)?: None Help needed to walk in hospital room?: A Little Help needed climbing 3-5 steps with a railing? : A Lot 6 Click Score: 21    End of Session   Activity Tolerance: Patient limited by pain Patient left: in bed Nurse Communication: Mobility status;Other (comment) PT Visit Diagnosis: Difficulty in walking, not elsewhere classified (R26.2);Muscle weakness (generalized) (M62.81);Pain Pain - Right/Left: Left Pain - part of body: Leg     Time: 1610-9604 PT Time Calculation (min) (ACUTE ONLY): 8 min  Charges:  $Gait Training: 8-22 mins                     Elizabeth Palau, PT, DPT, GCS [phone]    Brianna Ashley 08/21/2022, 4:33 PM

## 2022-08-21 NOTE — Progress Notes (Signed)
Chap attempted to visit the Pt and her bedside in response to a Spiritual Consult. Patient sleepy. Pt requested I come back another time. This Mirna Mires respected that wish. Mirna Mires will follow up later.   08/21/22 1300  Spiritual Encounters  Type of Visit Initial  Care provided to: Patient;Pt not available  OnCall Visit Yes

## 2022-08-21 NOTE — Progress Notes (Signed)
  Progress Note   Patient: Brianna Ashley ZOX:[id number] DOB: July 13, 1985 DOA: 08/17/2022     0 DOS: the patient was seen and examined on 08/21/2022   Brief hospital course: 37 year old female coming in with bilateral leg swelling and pain and difficulty ambulating.  Also has pain in her upper gum and poor dentition.  Patient has a history of factor V Leiden deficiency and has been off her Eliquis for at least 2 weeks.  Ultrasound bilateral lower extremities showed occlusive DVT within the distal right popliteal vein, posterior tibial vein and right peroneal vein and nonocclusive DVT within the left femoral and popliteal vein.  Case discussed with vascular surgery Dr. Myra Gianotti and recommended compression hose and anticoagulation lifelong.  CT scan maxillofacial uses extensive dental caries with periapical lucencies.  No abscess seen.  5/28.  PT recommending rehab.  TOC looking into options.  Patient with difficulty ambulating secondary to pain.  Albuterol as needed. 5/29: Continue to have bilateral leg pain which interfere with ambulation.  Awaiting SNF placement. 5/30: Hemodynamically stable.  Continue to complain about bilateral leg pain.  She was very frustrated with her living situation.  Started on Lexapro for anxiety and depression. Still no bed offers  Assessment and Plan: * DVT (deep venous thrombosis) (HCC) Bilateral DVT.  Case discussed with vascular surgery on-call over the weekend and recommends lifelong anticoagulation and TED hose.    Initially received heparin followed by Eliquis Will have to prescribe medications into [facility] community pharmacy. -Will need lifelong anticoagulation   Factor V Leiden (HCC) Will need lifelong anticoagulation.  Dental infection Patient also has upper lip pain.  Continue Augmentin.  With poor dentition would recommend [facility] dental clinic.  Tobacco abuse Nicotine patch  Seizures (HCC) Seizure precautions.  Not on any seizure  medication.    Weakness Patient with difficulty getting around secondary to pain in lower extremities.  Physical therapy recommending rehab.  TOC to look into options.  Normocytic anemia Hemoglobin 10.9   Subjective: Patient was seen and examined today.  She continued to complain about bilateral leg pain.  She was very frustrated with her living situation.  Physical Exam: Vitals:   08/20/22 2004 08/21/22 0609 08/21/22 0611 08/21/22 0909  BP: 111/63 (!) 114/55  113/79  Pulse: 79 (!) 56  66  Resp: 20 20  18   Temp: 98.6 F (37 C) 98.3 F (36.8 C)  98.2 F (36.8 C)  TempSrc: Oral     SpO2: 100% 97%  100%  Weight:   94.7 kg   Height:       General.  Obese lady, in no acute distress. Pulmonary.  Lungs clear bilaterally, normal respiratory effort. CV.  Regular rate and rhythm, no JVD, rub or murmur. Abdomen.  Soft, nontender, nondistended, BS positive. CNS.  Alert and oriented .  No focal neurologic deficit. Extremities.  No edema, no cyanosis, pulses intact and symmetrical. Psychiatry.  Judgment and insight appears normal.   Data Reviewed: Prior data reviewed  Family Communication:   Disposition: Status is: Observation The patient remains OBS appropriate and will d/c before 2 midnights.  Planned Discharge Destination: Skilled nursing facility  DVT prophylaxis.  Eliquis Time spent: 37 minutes  This record has been created using Conservation officer, historic buildings. Errors have been sought and corrected,but may not always be located. Such creation errors do not reflect on the standard of care.   Author: Arnetha Courser, MD 08/21/2022 3:58 PM  For on call review [email or web address].

## 2022-08-21 NOTE — Progress Notes (Signed)
OT Cancellation Note  Patient Details Name: Brianna Ashley MRN: [id number] DOB: [birth year]   Cancelled Treatment:    Reason Eval/Treat Not Completed: Other (comment). Upon attempt, pt with MD. Verita Schneiders for OT tx. Will re-attempt at later date/time.  Arman Filter., MPH, MS, OTR/L ascom [phone] 08/21/22, 11:28 AM

## 2022-08-22 ENCOUNTER — Other Ambulatory Visit: Payer: Self-pay

## 2022-08-22 DIAGNOSIS — I824Y3 Acute embolism and thrombosis of unspecified deep veins of proximal lower extremity, bilateral: Secondary | ICD-10-CM | POA: Diagnosis not present

## 2022-08-22 DIAGNOSIS — D6851 Activated protein C resistance: Secondary | ICD-10-CM | POA: Diagnosis not present

## 2022-08-22 DIAGNOSIS — K047 Periapical abscess without sinus: Secondary | ICD-10-CM | POA: Diagnosis not present

## 2022-08-22 DIAGNOSIS — R569 Unspecified convulsions: Secondary | ICD-10-CM | POA: Diagnosis not present

## 2022-08-22 MED ORDER — ESCITALOPRAM OXALATE 10 MG PO TABS
10.0000 mg | ORAL_TABLET | Freq: Every day | ORAL | 1 refills | Status: DC
  Filled 2022-08-22: qty 45, 45d supply, fill #0

## 2022-08-22 NOTE — Progress Notes (Signed)
Pt complains of left deltoid pain which resolved with removal of nicotine patch per pt.

## 2022-08-22 NOTE — TOC Progression Note (Addendum)
Transition of Care [facility]) - Progression Note    Patient Details  Name: Brianna Ashley MRN: [id number] Date of Birth: Aug 26, 1985  Transition [facility]) CM/SW Contact  Margarito Liner, LCSW Phone Number: 08/22/2022, 10:49 AM  Clinical Narrative:  [name] to offer a bed. [facility] liaison is still waiting on an answer for their facilities.   1:19 pm: [facility] declined. [facility] will review. Left message for [facility] liaison to see if there are any updates.  1:32 pm: [facility] declined.  2:19 pm: [facility] liaison is still trying to get a bed for her.  2:45 pm: [facility] is unable to offer a bed for [name], [address], or [facility]. CSW met with patient and provided update. She is unsure where she will go. CSW encouraged her to be making that decision and we can provide cab voucher if needed. Asked MD to enter DME order for wheelchair.  3:03 pm: Ordered wheelchair through [employer].  Expected Discharge Plan: Homeless Shelter Barriers to Discharge: Continued Medical Work up  Expected Discharge Plan and Services     Post Acute Care Choice: NA                                         Social Determinants of Health (SDOH) Interventions SDOH Screenings   Food Insecurity: Food Insecurity Present (08/18/2022)  Housing: High Risk (08/18/2022)  Transportation Needs: Unmet Transportation Needs (08/18/2022)  Utilities: Not At Risk (08/18/2022)  Tobacco Use: High Risk (08/17/2022)    Readmission Risk Interventions     No data to display

## 2022-08-22 NOTE — Progress Notes (Signed)
PT Cancellation Note  Patient Details Name: Brianna Ashley MRN: [id number] DOB: 02/20/1986   Cancelled Treatment:    Reason Eval/Treat Not Completed: Patient declined, no reason specified Patient declines PT at this time. Will continue to follow.   Bearett Porcaro 08/22/2022, 2:49 PM

## 2022-08-22 NOTE — Progress Notes (Signed)
Progress Note   Patient: Brianna Ashley ZOX:[id number] DOB: Sep 14, 1985 DOA: 08/17/2022     1 DOS: the patient was seen and examined on 08/22/2022   Brief hospital course: 37 year old female coming in with bilateral leg swelling and pain and difficulty ambulating.  Also has pain in her upper gum and poor dentition.  Patient has a history of factor V Leiden deficiency and has been off her Eliquis for at least 2 weeks.  Ultrasound bilateral lower extremities showed occlusive DVT within the distal right popliteal vein, posterior tibial vein and right peroneal vein and nonocclusive DVT within the left femoral and popliteal vein.  Case discussed with vascular surgery Dr. Myra Gianotti and recommended compression hose and anticoagulation lifelong.  CT scan maxillofacial uses extensive dental caries with periapical lucencies.  No abscess seen.  5/28.  PT recommending rehab.  TOC looking into options.  Patient with difficulty ambulating secondary to pain.  Albuterol as needed. 5/29: Continue to have bilateral leg pain which interfere with ambulation.  Awaiting SNF placement. 5/30: Hemodynamically stable.  Continue to complain about bilateral leg pain.  She was very frustrated with her living situation.  Started on Lexapro for anxiety and depression. Still no bed offers. 5/31: Failed multiple attempts to find a Medicaid bed for her.  Patient would like to be discharged back to community.  Wheelchair ordered.  She will leave tomorrow  Assessment and Plan: * DVT (deep venous thrombosis) (HCC) Bilateral DVT.  Case discussed with vascular surgery on-call over the weekend and recommends lifelong anticoagulation and TED hose.    Initially received heparin followed by Eliquis Will have to prescribe medications into [facility] community pharmacy. -Will need lifelong anticoagulation   Factor V Leiden (HCC) Will need lifelong anticoagulation.  Dental infection Patient also has upper lip pain.  Continue Augmentin.  With  poor dentition would recommend [facility] dental clinic.  Tobacco abuse Nicotine patch  Seizures (HCC) Seizure precautions.  Not on any seizure medication.    Weakness Patient with difficulty getting around secondary to pain in lower extremities.  Physical therapy recommending rehab.  TOC to look into options.  Normocytic anemia Hemoglobin 10.9   Subjective: Patient was eating breakfast when seen today.  Continue to have leg pain with ambulation.  She was having a lot of complaints against hospital staff which are mostly not paying immediate attention to her physical needs.  Tried explaining that sometimes we have to prioritize medical versus physical needs.  Physical Exam: Vitals:   08/21/22 2058 08/22/22 0422 08/22/22 0500 08/22/22 0837  BP: 115/75 116/65  122/67  Pulse: 80 74  79  Resp: 18 18  18   Temp: 98.4 F (36.9 C) 98.4 F (36.9 C)  98.2 F (36.8 C)  TempSrc:  Oral  Oral  SpO2:  100%  100%  Weight:   93.3 kg   Height:       General.  Obese lady, in no acute distress. Pulmonary.  Lungs clear bilaterally, normal respiratory effort. CV.  Regular rate and rhythm, no JVD, rub or murmur. Abdomen.  Soft, nontender, nondistended, BS positive. CNS.  Alert and oriented .  No focal neurologic deficit. Extremities.  No edema, no cyanosis, pulses intact and symmetrical. Psychiatry.  Judgment and insight appears normal.   Data Reviewed: Prior data reviewed  Family Communication:   Disposition: Status is: Observation The patient remains OBS appropriate and will d/c before 2 midnights.  Planned Discharge Destination: Skilled nursing facility  DVT prophylaxis.  Eliquis Time spent: 36 minutes  This record has been created using Conservation officer, historic buildings. Errors have been sought and corrected,but may not always be located. Such creation errors do not reflect on the standard of care.   Author: Arnetha Courser, MD 08/22/2022 3:19 PM  For on call review [email or web address].

## 2022-08-22 NOTE — Progress Notes (Signed)
Occupational Therapy Treatment Patient Details Name: Brianna Ashley MRN: [id number] DOB: 1985/12/28 Today's Date: 08/22/2022   History of present illness Per MD assessment: Pt is a 37 year old female coming in with bilateral leg swelling and pain and difficulty ambulating.  Patient has a history of factor V Leiden deficiency and has been off her Eliquis for at least 2 weeks.  Ultrasound bilateral lower extremities showed occlusive DVT within the distal right popliteal vein, posterior tibial vein and right peroneal vein and nonocclusive DVT within the left femoral and popliteal vein.  Case discussed with vascular surgery Dr. Myra Gianotti and recommended compression hose and anticoagulation lifelong.   OT comments  Pt endorses mod-severe pain in R LE, with a burning feeling. She is able to get out of bed and ambulate with Mod I-SUPV; however with increased pain with all movements and with any pressure applied to R LE. Pt and therapist discussed medication mgmt, importance of taking anticoagulation Rx regularly. Pt expresses high level of anxiety and low level of self-efficacy re: ability to maintain health once she leaves hospital. At present, pt states she will be living on the street once she discharges. Recommend RW in addition to [facility], if possible.   Recommendations for follow up therapy are one component of a multi-disciplinary discharge planning process, led by the attending physician.  Recommendations may be updated based on patient status, additional functional criteria and insurance authorization.    Assistance Recommended at Discharge Intermittent Supervision/Assistance  Patient can return home with the following  A little help with walking and/or transfers;A little help with bathing/dressing/bathroom;Assist for transportation   Equipment Recommendations  Other (comment) (RW)    Recommendations for Other Services      Precautions / Restrictions Precautions Precautions: Fall Precaution  Comments: seizure precautions Restrictions Weight Bearing Restrictions: No       Mobility Bed Mobility Overal bed mobility: Modified Independent                  Transfers Overall transfer level: Modified independent Equipment used: Rolling walker (2 wheels) Transfers: Sit to/from Stand Sit to Stand: Supervision           General transfer comment: increased time and effort; SUPV for safety but not physical assistance required     Balance Overall balance assessment: Needs assistance Sitting-balance support: Feet supported Sitting balance-Leahy Scale: Good     Standing balance support: Bilateral upper extremity supported, During functional activity Standing balance-Leahy Scale: Good                             ADL either performed or assessed with clinical judgement   ADL Overall ADL's : Needs assistance/impaired     Grooming: Wash/dry hands;Standing;Supervision/safety                   Toilet Transfer: Supervision/safety;Regular Toilet;Stand-pivot;Ambulation;Rolling walker (2 wheels)   Toileting- Clothing Manipulation and Hygiene: Modified independent              Extremity/Trunk Assessment Upper Extremity Assessment Upper Extremity Assessment: Generalized weakness   Lower Extremity Assessment Lower Extremity Assessment: Generalized weakness        Vision       Perception     Praxis      Cognition Arousal/Alertness: Awake/alert Behavior During Therapy: Anxious Overall Cognitive Status: Within Functional Limits for tasks assessed  Exercises Other Exercises Other Exercises: Educ re: falls prevention, safety, medication and health care mgmt    Shoulder Instructions       General Comments Pt anxious, distracted, w/ short attention span    Pertinent Vitals/ Pain       Pain Assessment Pain Assessment: Faces Pain Score: 6  Pain Descriptors / Indicators:  Aching, Burning Pain Intervention(s): Limited activity within patient's tolerance, Repositioned, RN gave pain meds during session  Home Living                                          Prior Functioning/Environment              Frequency  Min 2X/week        Progress Toward Goals  OT Goals(current goals can now be found in the care plan section)  Progress towards OT goals: Progressing toward goals  Acute Rehab OT Goals OT Goal Formulation: With patient Time For Goal Achievement: 09/02/22 Potential to Achieve Goals: Good  Plan Discharge plan remains appropriate;Frequency remains appropriate    Co-evaluation                 AM-PAC OT "6 Clicks" Daily Activity     Outcome Measure   Help from another person eating meals?: None Help from another person taking care of personal grooming?: None Help from another person toileting, which includes using toliet, bedpan, or urinal?: A Little Help from another person bathing (including washing, rinsing, drying)?: A Little Help from another person to put on and taking off regular upper body clothing?: None Help from another person to put on and taking off regular lower body clothing?: A Little 6 Click Score: 21    End of Session Equipment Utilized During Treatment: Rolling walker (2 wheels)  OT Visit Diagnosis: Other abnormalities of gait and mobility (R26.89);Muscle weakness (generalized) (M62.81)   Activity Tolerance Patient tolerated treatment well   Patient Left in bed;with call bell/phone within reach   Nurse Communication          Time: 4098-1191 OT Time Calculation (min): 24 min  Charges: OT General Charges $OT Visit: 1 Visit OT Treatments $Self Care/Home Management : 23-37 mins  Latina Craver, PhD, MS, OTR/L 08/22/22, 4:13 PM

## 2022-08-22 NOTE — TOC CM/SW Note (Signed)
    Durable Medical Equipment  (From admission, onward)           Start     Ordered   08/22/22 1448  For home use only DME lightweight manual wheelchair with seat cushion  Once       Comments: Patient suffers from bilateral DVT which impairs their ability to perform daily activities like bathing, dressing, feeding, grooming, and toileting in the home.  A walker will not resolve  issue with performing activities of daily living. A wheelchair will allow patient to safely perform daily activities. Patient is not able to propel themselves in the home using a standard weight wheelchair due to general weakness. Patient can self propel in the lightweight wheelchair. Length of need 6 months . Accessories: elevating leg rests (ELRs), wheel locks, extensions and anti-tippers.   08/22/22 1448   08/20/22 1622  For home use only DME Walker rolling  Once       Question Answer Comment  Walker: With 5 Inch Wheels   Patient needs a walker to treat with the following condition Difficulty walking      08/20/22 1621

## 2022-08-22 NOTE — Progress Notes (Signed)
Hi everyone! Patient is ready for discharge tomorrow (Saturday), meds are ready, just notify Pharmacy, patient also have a personal belonging from security, just call them kindly. Thank you all

## 2022-08-23 DIAGNOSIS — I824Y3 Acute embolism and thrombosis of unspecified deep veins of proximal lower extremity, bilateral: Secondary | ICD-10-CM | POA: Diagnosis not present

## 2022-08-23 DIAGNOSIS — D6851 Activated protein C resistance: Secondary | ICD-10-CM | POA: Diagnosis not present

## 2022-08-23 DIAGNOSIS — D649 Anemia, unspecified: Secondary | ICD-10-CM | POA: Diagnosis not present

## 2022-08-23 DIAGNOSIS — K047 Periapical abscess without sinus: Secondary | ICD-10-CM | POA: Diagnosis not present

## 2022-08-23 NOTE — TOC Progression Note (Signed)
Transition of Care [facility]) - Progression Note    Patient Details  Name: Brianna Ashley MRN: [id number] Date of Birth: 1986/01/29  Transition of [facility]) CM/SW Contact  Garret Reddish, RN Phone Number: 08/23/2022, 11:13 AM  Clinical Narrative:   Chart reviewed. Noted that patient has orders for discharge today.    I have spoken with Brianna Ashley.  I have informed her that no facilities where able to offer a bed. I have encouraged her to follow up with shelters to assist with housing.  Brianna Ashley reports that she will not be going to any shelter. She reports that she will be discharging to the street today.  I have provided Housing/Shelter information on patient's discharge instructions.  Patient has a wheelchair at bedside.    Patient has requested there her personal items from Security be brought to her.  Pharmacy will provide prescription medications on discharge.    Cab voucher can also be provided on discharge.   I have informed staff nurse of the above information      Expected Discharge Plan: Homeless Shelter Barriers to Discharge: Continued Medical Work up  Expected Discharge Plan and Services     Post Acute Care Choice: NA   Expected Discharge Date: 08/23/22                                     Social Determinants of Health (SD [place]) Interventions SDOH Screenings   Food Insecurity: Food Insecurity Present (08/18/2022)  Housing: High Risk (08/18/2022)  Transportation Needs: Unmet Transportation Needs (08/18/2022)  Utilities: Not At Risk (08/18/2022)  Tobacco Use: High Risk (08/17/2022)    Readmission Risk Interventions     No data to display

## 2022-08-23 NOTE — Discharge Summary (Signed)
Physician Discharge Summary   Patient: Brianna Ashley MRN: [id number] DOB: [birth year]  Admit date:     08/17/2022  Discharge date: 08/23/22  Discharge Physician: Arnetha Courser   PCP: Patient, No Pcp Per   Recommendations at discharge:  Please obtain CBC and BMP in 1 week Please ensure the completion of antibiotic for dental infection Patient need to stay on Eliquis for rest of her life Patient need titration of Lexapro dose Follow-up with dentist Follow-up with primary care provider  Discharge Diagnoses: Principal Problem:   DVT (deep venous thrombosis) (HCC) Active Problems:   Factor V Leiden (HCC)   Dental infection   Tobacco abuse   Seizures (HCC)   Normocytic anemia   Weakness   Hospital Course: 37 year old female coming in with bilateral leg swelling and pain and difficulty ambulating.  Also has pain in her upper gum and poor dentition.  Patient has a history of factor V Leiden deficiency and has been off her Eliquis for at least 2 weeks.  Ultrasound bilateral lower extremities showed occlusive DVT within the distal right popliteal vein, posterior tibial vein and right peroneal vein and nonocclusive DVT within the left femoral and popliteal vein.  Case discussed with vascular surgery Dr. Myra Gianotti and recommended compression hose and anticoagulation lifelong.  CT scan maxillofacial uses extensive dental caries with periapical lucencies.  No abscess seen.  5/28.  PT recommending rehab.  TOC looking into options.  Patient with difficulty ambulating secondary to pain.  Albuterol as needed. 5/29: Continue to have bilateral leg pain which interfere with ambulation.  Awaiting SNF placement. 5/30: Hemodynamically stable.  Continue to complain about bilateral leg pain.  She was very frustrated with her living situation.  Started on Lexapro for anxiety and depression. Still no bed offers. 5/31: Failed multiple attempts to find a Medicaid bed for her.  Patient would like to be discharged  back to community.  Wheelchair ordered.  She will leave tomorrow 6/1: Remained hemodynamically stable.  Wheelchair and medications were delivered at bedside.  Patient need to follow-up with [facility] dental clinic for concern of dental infection and will continue taking Augmentin until 08/31/2022.  She need to continue on Eliquis for rest of her life.  She was also started on Lexapro and PCP need to titrate the dose.  She will continue on current medications and need to have a close follow-up by her providers for further recommendations.  Assessment and Plan: * DVT (deep venous thrombosis) (HCC) Bilateral DVT.  Case discussed with vascular surgery on-call over the weekend and recommends lifelong anticoagulation and TED hose.    Initially received heparin followed by Eliquis Will have to prescribe medications into [facility] community pharmacy. -Will need lifelong anticoagulation   Factor V Leiden (HCC) Will need lifelong anticoagulation.  Dental infection Patient also has upper lip pain.  Continue Augmentin.  With poor dentition would recommend [facility] dental clinic.  Tobacco abuse Nicotine patch  Seizures (HCC) Seizure precautions.  Not on any seizure medication.    Weakness Patient with difficulty getting around secondary to pain in lower extremities.  Physical therapy recommending rehab.  TOC to look into options.  Normocytic anemia Hemoglobin 10.9   Pain control - [place] Controlled Substance Reporting System database was reviewed. and patient was instructed, not to drive, operate heavy machinery, perform activities at heights, swimming or participation in water activities or provide baby-sitting services while on Pain, Sleep and Anxiety Medications; until their outpatient Physician has advised to do so again. Also recommended to  not to take more than prescribed Pain, Sleep and Anxiety Medications.  Consultants: None Procedures performed: None Disposition: Home Diet recommendation:   Discharge Diet Orders (From admission, onward)     Start     Ordered   08/23/22 0000  Diet - low sodium heart healthy        08/23/22 1054           Regular diet DISCHARGE MEDICATION: Allergies as of 08/23/2022       Reactions   Ambien [zolpidem Tartrate] Other (See Comments)   death   Ambien [zolpidem Tartrate]    Clindamycin/lincomycin Nausea And Vomiting   Keflex [cephalexin] Nausea And Vomiting   Methylphenidate Other (See Comments)   "I pretty much died in my dad's arms"   Ritalin [methylphenidate Hcl] Other (See Comments)   death   Ritalin [methylphenidate Hcl]    Zolpidem Other (See Comments)   "I pretty much died a few times"        Medication List     STOP taking these medications    ondansetron 4 MG disintegrating tablet Commonly known as: ZOFRAN-ODT       TAKE these medications    acetaminophen 325 MG tablet Commonly known as: TYLENOL Take 2 tablets (650 mg total) by mouth every 6 (six) hours as needed for mild pain, moderate pain, fever or headache (or Fever >/= 101).   amoxicillin-clavulanate 875-125 MG tablet Commonly known as: AUGMENTIN Take 1 tablet by mouth 2 (two) times daily for 12 days.   Eliquis 5 MG Tabs tablet Generic drug: apixaban Take 2 tablets by mouth twice a day for six days, then take one tablet by mouth twice a day afterwards What changed:  how much to take how to take this when to take this additional instructions   escitalopram 10 MG tablet Commonly known as: LEXAPRO Take 1 tablet (10 mg total) by mouth daily.   HYDROcodone-acetaminophen 5-325 MG tablet Commonly known as: NORCO/VICODIN Take 1 tablet by mouth every 6 (six) hours as needed.   Ventolin HFA 108 (90 Base) MCG/ACT inhaler Generic drug: albuterol Inhale 2 puffs into the lungs every 6 (six) hours as needed for wheezing or shortness of breath.               Durable Medical Equipment  (From admission, onward)           Start     Ordered    08/22/22 1448  For home use only DME lightweight manual wheelchair with seat cushion  Once       Comments: Patient suffers from bilateral DVT which impairs their ability to perform daily activities like bathing, dressing, feeding, grooming, and toileting in the home.  A walker will not resolve  issue with performing activities of daily living. A wheelchair will allow patient to safely perform daily activities. Patient is not able to propel themselves in the home using a standard weight wheelchair due to general weakness. Patient can self propel in the lightweight wheelchair. Length of need 6 months . Accessories: elevating leg rests (ELRs), wheel locks, extensions and anti-tippers.   08/22/22 1448   08/20/22 1622  For home use only DME Walker rolling  Once       Question Answer Comment  Walker: With 5 Inch Wheels   Patient needs a walker to treat with the following condition Difficulty walking      08/20/22 1621            Follow-up Information  [facility] Follow up.   Why: Cal to set up appointment [phone]        [facility] Follow up in 5 day(s).   Contact information: [address] [place] [address] [phone]                Discharge Exam: Filed Weights   08/21/22 1308 08/22/22 0500 08/23/22 0409  Weight: 94.7 kg 93.3 kg 93.1 kg   General.  Overweight lady, in no acute distress. Pulmonary.  Lungs clear bilaterally, normal respiratory effort. CV.  Regular rate and rhythm, no JVD, rub or murmur. Abdomen.  Soft, nontender, nondistended, BS positive. CNS.  Alert and oriented .  No focal neurologic deficit. Extremities.  No edema, no cyanosis, pulses intact and symmetrical. Psychiatry.  Judgment and insight appears normal.   Condition at discharge: stable  The results of significant diagnostics from this hospitalization (including imaging, microbiology, ancillary and laboratory) are listed below for reference.   Imaging  Studies: CT MAXILLOFACIAL WO CONTRAST  Result Date: 08/18/2022 CLINICAL DATA:  Gum pain and ear pain on the right. EXAM: CT MAXILLOFACIAL WITHOUT CONTRAST TECHNIQUE: Multidetector CT imaging of the maxillofacial structures was performed. Multiplanar CT image reconstructions were also generated. RADIATION DOSE REDUCTION: This exam was performed according to the departmental dose-optimization program which includes automated exposure control, adjustment of the mA and/or kV according to patient size and/or use of iterative reconstruction technique. COMPARISON:  02/02/2022 neck CT FINDINGS: Osseous: Extensive dental caries with deep erosion seen diffusely. Periapical lucencies most notably affecting teeth 2, 17, 22, and 30. Unerupted right wisdom tooth. No superimposed soft tissue inflammation is seen. Orbits: Negative Sinuses: Negative for sinusitis. Soft tissues: No evidence of inflammation or mass Limited intracranial: Negative. IMPRESSION: Extensive dental caries with periapical lucencies listed above. No superimposed soft tissue inflammation or collection. Electronically Signed   By: Tiburcio Pea M.D.   On: 08/18/2022 08:30   US Venous Img Lower Bilateral  Result Date: 08/17/2022 CLINICAL DATA:  Bilateral lower extremity swelling. EXAM: BILATERAL LOWER EXTREMITY VENOUS DOPPLER ULTRASOUND TECHNIQUE: Gray-scale sonography with graded compression, as well as color Doppler and duplex ultrasound were performed to evaluate the lower extremity deep venous systems from the level of the common femoral vein and including the common femoral, femoral, profunda femoral, popliteal and calf veins including the posterior tibial, peroneal and gastrocnemius veins when visible. The superficial great saphenous vein was also interrogated. Spectral Doppler was utilized to evaluate flow at rest and with distal augmentation maneuvers in the common femoral, femoral and popliteal veins. COMPARISON:  July 14, 2020 FINDINGS: RIGHT  LOWER EXTREMITY Common Femoral Vein: No evidence of thrombus. Normal compressibility, respiratory phasicity and response to augmentation. Saphenofemoral Junction: No evidence of thrombus. Normal compressibility and flow on color Doppler imaging. Profunda Femoral Vein: No evidence of thrombus. Normal compressibility and flow on color Doppler imaging. Femoral Vein: No evidence of thrombus. Normal compressibility, respiratory phasicity and response to augmentation. Popliteal Vein: Evidence of distal occlusive thrombus with abnormal compressibility, respiratory phasicity and response to augmentation. Calf Veins: Evidence of occlusive thrombus with abnormal compressibility and flow on color Doppler imaging. Superficial Great Saphenous Vein: No evidence of thrombus. Normal compressibility. Venous Reflux:  None. Other Findings:  None. LEFT LOWER EXTREMITY Common Femoral Vein: No evidence of thrombus. Normal compressibility, respiratory phasicity and response to augmentation. Saphenofemoral Junction: No evidence of thrombus. Normal compressibility and flow on color Doppler imaging. Profunda Femoral Vein: No evidence of thrombus. Normal compressibility and flow on  color Doppler imaging. Femoral Vein: Evidence of nonocclusive thrombus with abnormal compressibility, respiratory phasicity and response to augmentation. Popliteal Vein: Evidence of nonocclusive thrombus with abnormal compressibility, respiratory phasicity and response to augmentation. Calf Veins: The LEFT posterior tibial vein and LEFT peroneal vein are poorly visualized. Superficial Great Saphenous Vein: No evidence of thrombus. Normal compressibility. Venous Reflux:  None. Other Findings:  None. IMPRESSION: 1. Occlusive DVT within the distal RIGHT popliteal vein, RIGHT posterior tibial vein and RIGHT peroneal vein. 2. Limited evaluation of the LEFT posterior tibial vein and LEFT peroneal vein with nonocclusive DVT within the LEFT femoral vein and LEFT popliteal  vein. Electronically Signed   By: Aram Candela M.D.   On: 08/17/2022 20:11    Microbiology: Results for orders placed or performed during the hospital encounter of 02/02/22  Group A Strep by PCR ([facility] Only)     Status: Abnormal   Collection Time: 02/02/22 11:06 AM   Specimen: Throat; Sterile Swab  Result Value Ref Range Status   Group A Strep by PCR DETECTED (A) NOT DETECTED Final    Comment: Performed at [facility], [address], [place], [place] [address]  Resp Panel by RT-PCR (Flu A&B, Covid) Throat     Status: None   Collection Time: 02/02/22 11:06 AM   Specimen: Throat; Nasal Swab  Result Value Ref Range Status   SARS Coronavirus 2 by RT PCR NEGATIVE NEGATIVE Final    Comment: (NOTE) SARS-CoV-2 target nucleic acids are NOT DETECTED.  The SARS-CoV-2 RNA is generally detectable in upper respiratory specimens during the acute phase of infection. The lowest concentration of SARS-CoV-2 viral copies this assay can detect is 138 copies/mL. A negative result does not preclude SARS-Cov-2 infection and should not be used as the sole basis for treatment or other patient management decisions. A negative result may occur with  improper specimen collection/handling, submission of specimen other than nasopharyngeal swab, presence of viral mutation(s) within the areas targeted by this assay, and inadequate number of viral copies(<138 copies/mL). A negative result must be combined with clinical observations, patient history, and epidemiological information. The expected result is Negative.  Fact Sheet for Patients:  BloggerCourse.com  Fact Sheet for Healthcare Providers:  SeriousBroker.it  This test is no t yet approved or cleared by the [place] FDA and  has been authorized for detection and/or diagnosis of SARS-CoV-2 by FDA under an Emergency Use Authorization (EUA). This EUA will remain  in effect (meaning this  test can be used) for the duration of the COVID-19 declaration under Section 564(b)(1) of the Act, 21 U.S.C.section 360bbb-3(b)(1), unless the authorization is terminated  or revoked sooner.       Influenza A by PCR NEGATIVE NEGATIVE Final   Influenza B by PCR NEGATIVE NEGATIVE Final    Comment: (NOTE) The Xpert Xpress SARS-CoV-2/FLU/RSV plus assay is intended as an aid in the diagnosis of influenza from Nasopharyngeal swab specimens and should not be used as a sole basis for treatment. Nasal washings and aspirates are unacceptable for Xpert Xpress SARS-CoV-2/FLU/RSV testing.  Fact Sheet for Patients: BloggerCourse.com  Fact Sheet for Healthcare Providers: SeriousBroker.it  This test is not yet approved or cleared by the [place] FDA and has been authorized for detection and/or diagnosis of SARS-CoV-2 by FDA under an Emergency Use Authorization (EUA). This EUA will remain in effect (meaning this test can be used) for the duration of the COVID-19 declaration under Section 564(b)(1) of the Act, 21 U.S.C. section 360bbb-3(b)(1), unless the authorization  is terminated or revoked.  Performed at [facility], [address] [place], [place] [address]     Labs: CBC: Recent Labs  Lab 08/17/22 1912 08/18/22 0606 08/20/22 0624  WBC 8.5 7.2 6.3  NEUTROABS 5.9  --   --   HGB 11.6* 10.9* 11.9*  HCT 35.7* 33.8* 35.4*  MCV 90.2 90.1 87.8  PLT 265 270 289   Basic Metabolic Panel: Recent Labs  Lab 08/17/22 1912 08/18/22 0606 08/20/22 0624  NA 136 137 138  K 3.8 4.6 4.2  CL 104 109 104  CO2 25 25 27   GLUCOSE 120* 128* 97  BUN 14 15 19   CREATININE 0.94 1.01* 0.95  CALCIUM 8.5* 8.3* 8.9   Liver Function Tests: Recent Labs  Lab 08/17/22 1912 08/18/22 0606  AST 20 15  ALT 11 9  ALKPHOS 70 66  BILITOT 0.6 0.2*  PROT 6.1* 5.5*  ALBUMIN 3.4* 2.8*   CBG: No results for input(s): "GLUCAP" in the last  168 hours.  Discharge time spent: greater than 30 minutes.  This record has been created using Conservation officer, historic buildings. Errors have been sought and corrected,but may not always be located. Such creation errors do not reflect on the standard of care.   Signed: Arnetha Courser, MD [facility] 08/23/2022
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
## 2022-08-28 NOTE — Congregational Nurse Program (Signed)
  Dept: [phone]   Congregational Nurse Program Note  Date of Encounter: 08/28/2022 Client to [facility] day center for assistance following recent hospital stay. Rn was able to assist with a new patient apt at [facility] as client did not have a current PCP. Apt set for 6/25 at 1:30. PCP needs to be changed with [facility], client unable to stay at the center for RN to make this call. Discharge medications discharge medications reviewed. Client currently has all needed medications. She reports she is not taking the lexapro prescribed as it makes her "feel funny". She reports she has tried to take it at night, but wakes still "feeling funny". Rn did review side effects of medications that should decrease, client voiced understanding, but was unsure if she would be taking it.  RN to assist client with changing her PCP [facility] at her next visit. Francesco Runner BSN, RN Past Medical History: Past Medical History:  Diagnosis Date   Asthma    Carpal tunnel syndrome    DVT of lower extremity (deep venous thrombosis) (HCC)    Factor 5 Leiden mutation, heterozygous (HCC)    Migraine    Seizures (HCC)     Encounter Details:  CNP Questionnaire - 08/28/22 1258       Questionnaire   Ask client: Do you give verbal consent for me to treat you today? Yes    Student Assistance N/A    Location Patient Served  [facility]    Visit Setting with Client Organization    Patient Status Unhoused   client reports she is "staying where ever"   Insurance Medicaid   [employer]   Insurance/Financial Assistance Referral N/A    Medication N/A   Medication obtained at [facility] health pharmacy   Medical Provider No   PCP apt made at [facility] for client   Screening Referrals Made N/A    Medical Referrals Made Non-[facility] PCP/Clinic   [facility]   Medical Appointment Made Non-[facility] PCP/clinic   New patient appointment made at [facility] 6/25 at 1;30 pm. Client aware   Recently w/o PCP, now 1st  time PCP visit completed due to CNs referral or appointment made N/A    Food Have Food Insecurities    Transportation N/A   client uses the [place] bus system, Rn to provide bus pass for Md apt if needed   Housing/Utilities No permanent housing    Economist N/A    Interventions Advocate/Support;Educate;Navigate Healthcare System;Reviewed Medications;Reviewed D/C Planning    Abnormal to Normal Screening Since Last CN Visit N/A    Screenings CN Performed N/A    Sent Client to Lab for: N/A    Did client attend any of the following based off CNs referral or appointments made? N/A    ED Visit Averted N/A    Life-Saving Intervention Made N/A

## 2022-08-31 ENCOUNTER — Emergency Department: Payer: Medicaid Other

## 2022-08-31 ENCOUNTER — Encounter: Payer: Self-pay | Admitting: Emergency Medicine

## 2022-08-31 ENCOUNTER — Emergency Department
Admission: EM | Admit: 2022-08-31 | Discharge: 2022-08-31 | Disposition: A | Discharge status: Home / Self Care | Payer: Medicaid Other | Attending: Emergency Medicine | Admitting: Emergency Medicine

## 2022-08-31 ENCOUNTER — Other Ambulatory Visit: Payer: Self-pay

## 2022-08-31 DIAGNOSIS — I82409 Acute embolism and thrombosis of unspecified deep veins of unspecified lower extremity: Secondary | ICD-10-CM | POA: Diagnosis present

## 2022-08-31 DIAGNOSIS — Z720 Tobacco use: Secondary | ICD-10-CM | POA: Diagnosis present

## 2022-08-31 DIAGNOSIS — R519 Headache, unspecified: Secondary | ICD-10-CM | POA: Diagnosis not present

## 2022-08-31 DIAGNOSIS — Z716 Tobacco abuse counseling: Secondary | ICD-10-CM | POA: Diagnosis not present

## 2022-08-31 DIAGNOSIS — F17210 Nicotine dependence, cigarettes, uncomplicated: Secondary | ICD-10-CM | POA: Insufficient documentation

## 2022-08-31 DIAGNOSIS — Z7951 Long term (current) use of inhaled steroids: Secondary | ICD-10-CM | POA: Insufficient documentation

## 2022-08-31 DIAGNOSIS — D6851 Activated protein C resistance: Secondary | ICD-10-CM

## 2022-08-31 DIAGNOSIS — I824Y9 Acute embolism and thrombosis of unspecified deep veins of unspecified proximal lower extremity: Secondary | ICD-10-CM | POA: Diagnosis not present

## 2022-08-31 DIAGNOSIS — M7989 Other specified soft tissue disorders: Secondary | ICD-10-CM | POA: Diagnosis present

## 2022-08-31 DIAGNOSIS — I824Z3 Acute embolism and thrombosis of unspecified deep veins of distal lower extremity, bilateral: Secondary | ICD-10-CM

## 2022-08-31 DIAGNOSIS — I82503 Chronic embolism and thrombosis of unspecified deep veins of lower extremity, bilateral: Secondary | ICD-10-CM | POA: Insufficient documentation

## 2022-08-31 DIAGNOSIS — J45909 Unspecified asthma, uncomplicated: Secondary | ICD-10-CM | POA: Diagnosis not present

## 2022-08-31 DIAGNOSIS — Z7901 Long term (current) use of anticoagulants: Secondary | ICD-10-CM | POA: Insufficient documentation

## 2022-08-31 LAB — CBC WITH DIFFERENTIAL/PLATELET
Abs Immature Granulocytes: 0.03 10*3/uL (ref 0.00–0.07)
Basophils Absolute: 0.1 10*3/uL (ref 0.0–0.1)
Basophils Relative: 1 %
Eosinophils Absolute: 0.2 10*3/uL (ref 0.0–0.5)
Eosinophils Relative: 2 %
HCT: 31.9 % — ABNORMAL LOW (ref 36.0–46.0)
Hemoglobin: 10.9 g/dL — ABNORMAL LOW (ref 12.0–15.0)
Immature Granulocytes: 0 %
Lymphocytes Relative: 32 %
Lymphs Abs: 3.4 10*3/uL (ref 0.7–4.0)
MCH: 29.5 pg (ref 26.0–34.0)
MCHC: 34.2 g/dL (ref 30.0–36.0)
MCV: 86.4 fL (ref 80.0–100.0)
Monocytes Absolute: 0.7 10*3/uL (ref 0.1–1.0)
Monocytes Relative: 6 %
Neutro Abs: 6.2 10*3/uL (ref 1.7–7.7)
Neutrophils Relative %: 59 %
Platelets: 309 10*3/uL (ref 150–400)
RBC: 3.69 MIL/uL — ABNORMAL LOW (ref 3.87–5.11)
RDW: 14.1 % (ref 11.5–15.5)
WBC: 10.5 10*3/uL (ref 4.0–10.5)
nRBC: 0 % (ref 0.0–0.2)

## 2022-08-31 LAB — COMPREHENSIVE METABOLIC PANEL
ALT: 11 U/L (ref 0–44)
AST: 17 U/L (ref 15–41)
Albumin: 3.8 g/dL (ref 3.5–5.0)
Alkaline Phosphatase: 68 U/L (ref 38–126)
Anion gap: 10 (ref 5–15)
BUN: 11 mg/dL (ref 6–20)
CO2: 19 mmol/L — ABNORMAL LOW (ref 22–32)
Calcium: 8.7 mg/dL — ABNORMAL LOW (ref 8.9–10.3)
Chloride: 109 mmol/L (ref 98–111)
Creatinine, Ser: 0.9 mg/dL (ref 0.44–1.00)
GFR, Estimated: >60 mL/min (ref >60)
Glucose, Bld: 113 mg/dL — ABNORMAL HIGH (ref 70–99)
Potassium: 3.5 mmol/L (ref 3.5–5.1)
Sodium: 138 mmol/L (ref 135–145)
Total Bilirubin: 0.4 mg/dL (ref 0.3–1.2)
Total Protein: 6.3 g/dL — ABNORMAL LOW (ref 6.5–8.1)

## 2022-08-31 MED ORDER — ESCITALOPRAM OXALATE 10 MG PO TABS
10.0000 mg | ORAL_TABLET | Freq: Every day | ORAL | Status: DC
  Administered 2022-08-31: 10 mg via ORAL
  Filled 2022-08-31: qty 1

## 2022-08-31 MED ORDER — ACETAMINOPHEN 325 MG PO TABS: 650.0000 mg | ORAL_TABLET | Freq: Four times a day (QID) | ORAL | Status: DC | PRN

## 2022-08-31 MED ORDER — ALBUTEROL SULFATE (2.5 MG/3ML) 0.083% IN NEBU: 3.0000 mL | INHALATION_SOLUTION | Freq: Four times a day (QID) | RESPIRATORY_TRACT | Status: DC | PRN

## 2022-08-31 MED ORDER — APIXABAN 5 MG PO TABS
5.0000 mg | ORAL_TABLET | Freq: Two times a day (BID) | ORAL | Status: DC
  Administered 2022-08-31: 5 mg via ORAL
  Filled 2022-08-31: qty 1
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
## 2022-08-31 NOTE — Progress Notes (Signed)
PT Cancellation Note  Patient Details Name: Brianna Ashley MRN: [id number] DOB: 05-22-1985   Cancelled Treatment:    Reason Eval/Treat Not Completed: Patient declined, no reason specified. Pt initially asleep but aroused to auditory and gentle tactile stimuli. Pt initially refusing due to having no pants on. PT procured and delivered paper scrub pants and offered to assist with donning. Pt again refused assistance or any mobility at this time. PT will continue to follow-up with pt acutely as available and appropriate.    Alessandra Bevels Suhani Stillion 08/31/2022, 1:22 PM

## 2022-08-31 NOTE — Progress Notes (Signed)
CSW spoke with EDP and explained patient was discharged 8 days ago and patient was denied by 30 or so rehab facilities. Patient was given homeless resources and a wheelchair. Discharge disposition from the ED is unknown at this time. TOC will continue to follow.

## 2022-08-31 NOTE — Assessment & Plan Note (Signed)
Now on chronic Eliquis in the setting of recent DVT

## 2022-08-31 NOTE — Assessment & Plan Note (Signed)
Noted improving DVT on imaging today Dr. Cyril Loosen to reach out to social work to see if there is any facilities available from a rehab perspective accept Medicaid If there are any facilities, will plan to admit patient for eventual placement Will otherwise continue current treatment regimen given clinical improvement Dr. Cyril Loosen aware of plan
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
## 2022-08-31 NOTE — Assessment & Plan Note (Signed)
1-1/2 pack/day smoker Discussed smoking cessation at the bedside

## 2022-09-16 ENCOUNTER — Ambulatory Visit: Payer: Medicaid Other | Admitting: Family

## 2023-02-13 ENCOUNTER — Encounter: Payer: Self-pay | Admitting: Emergency Medicine

## 2023-02-13 ENCOUNTER — Emergency Department: Payer: Medicaid Other

## 2023-02-13 ENCOUNTER — Observation Stay
Admission: EM | Admit: 2023-02-13 | Discharge: 2023-02-14 | Disposition: A | Discharge status: Home / Self Care | Payer: Medicaid Other | Attending: Internal Medicine | Admitting: Internal Medicine

## 2023-02-13 ENCOUNTER — Other Ambulatory Visit: Payer: Self-pay

## 2023-02-13 DIAGNOSIS — D6851 Activated protein C resistance: Secondary | ICD-10-CM | POA: Diagnosis present

## 2023-02-13 DIAGNOSIS — Z1152 Encounter for screening for COVID-19: Secondary | ICD-10-CM | POA: Diagnosis not present

## 2023-02-13 DIAGNOSIS — I82409 Acute embolism and thrombosis of unspecified deep veins of unspecified lower extremity: Secondary | ICD-10-CM | POA: Diagnosis not present

## 2023-02-13 DIAGNOSIS — J45909 Unspecified asthma, uncomplicated: Secondary | ICD-10-CM | POA: Diagnosis not present

## 2023-02-13 DIAGNOSIS — F17210 Nicotine dependence, cigarettes, uncomplicated: Secondary | ICD-10-CM | POA: Insufficient documentation

## 2023-02-13 DIAGNOSIS — I824Y3 Acute embolism and thrombosis of unspecified deep veins of proximal lower extremity, bilateral: Secondary | ICD-10-CM

## 2023-02-13 DIAGNOSIS — I825Z2 Chronic embolism and thrombosis of unspecified deep veins of left distal lower extremity: Secondary | ICD-10-CM

## 2023-02-13 DIAGNOSIS — Z7901 Long term (current) use of anticoagulants: Secondary | ICD-10-CM | POA: Insufficient documentation

## 2023-02-13 DIAGNOSIS — Z79899 Other long term (current) drug therapy: Secondary | ICD-10-CM | POA: Insufficient documentation

## 2023-02-13 DIAGNOSIS — R2243 Localized swelling, mass and lump, lower limb, bilateral: Secondary | ICD-10-CM | POA: Diagnosis present

## 2023-02-13 DIAGNOSIS — R600 Localized edema: Principal | ICD-10-CM

## 2023-02-13 LAB — URINALYSIS, ROUTINE W REFLEX MICROSCOPIC
Bilirubin Urine: NEGATIVE
Glucose, UA: NEGATIVE mg/dL
Hgb urine dipstick: NEGATIVE
Ketones, ur: NEGATIVE mg/dL
Leukocytes,Ua: NEGATIVE
Nitrite: NEGATIVE
Protein, ur: NEGATIVE mg/dL
Specific Gravity, Urine: 1.008 (ref 1.005–1.030)
pH: 6 (ref 5.0–8.0)

## 2023-02-13 LAB — CBC WITH DIFFERENTIAL/PLATELET
Abs Immature Granulocytes: 0.01 10*3/uL (ref 0.00–0.07)
Basophils Absolute: 0.1 10*3/uL (ref 0.0–0.1)
Basophils Relative: 1 %
Eosinophils Absolute: 0.2 10*3/uL (ref 0.0–0.5)
Eosinophils Relative: 3 %
HCT: 35.4 % — ABNORMAL LOW (ref 36.0–46.0)
Hemoglobin: 11.8 g/dL — ABNORMAL LOW (ref 12.0–15.0)
Immature Granulocytes: 0 %
Lymphocytes Relative: 35 %
Lymphs Abs: 3 10*3/uL (ref 0.7–4.0)
MCH: 29.6 pg (ref 26.0–34.0)
MCHC: 33.3 g/dL (ref 30.0–36.0)
MCV: 88.9 fL (ref 80.0–100.0)
Monocytes Absolute: 0.6 10*3/uL (ref 0.1–1.0)
Monocytes Relative: 7 %
Neutro Abs: 4.6 10*3/uL (ref 1.7–7.7)
Neutrophils Relative %: 54 %
Platelets: 216 10*3/uL (ref 150–400)
RBC: 3.98 MIL/uL (ref 3.87–5.11)
RDW: 14.5 % (ref 11.5–15.5)
WBC: 8.6 10*3/uL (ref 4.0–10.5)
nRBC: 0 % (ref 0.0–0.2)

## 2023-02-13 LAB — COMPREHENSIVE METABOLIC PANEL
ALT: 11 U/L (ref 0–44)
AST: 15 U/L (ref 15–41)
Albumin: 4 g/dL (ref 3.5–5.0)
Alkaline Phosphatase: 55 U/L (ref 38–126)
Anion gap: 5 (ref 5–15)
BUN: 14 mg/dL (ref 6–20)
CO2: 24 mmol/L (ref 22–32)
Calcium: 8.6 mg/dL — ABNORMAL LOW (ref 8.9–10.3)
Chloride: 106 mmol/L (ref 98–111)
Creatinine, Ser: 0.77 mg/dL (ref 0.44–1.00)
GFR, Estimated: >60 mL/min (ref >60)
Glucose, Bld: 94 mg/dL (ref 70–99)
Potassium: 3.7 mmol/L (ref 3.5–5.1)
Sodium: 135 mmol/L (ref 135–145)
Total Bilirubin: 0.8 mg/dL (ref <1.2)
Total Protein: 6.7 g/dL (ref 6.5–8.1)

## 2023-02-13 LAB — POC URINE PREG, ED: Preg Test, Ur: NEGATIVE

## 2023-02-13 MED ORDER — ALBUTEROL SULFATE (2.5 MG/3ML) 0.083% IN NEBU: 2.5000 mg | INHALATION_SOLUTION | Freq: Four times a day (QID) | RESPIRATORY_TRACT | Status: DC | PRN

## 2023-02-13 MED ORDER — OXYCODONE-ACETAMINOPHEN 5-325 MG PO TABS
1.0000 | ORAL_TABLET | Freq: Once | ORAL | Status: AC
  Administered 2023-02-13: 1 via ORAL
  Filled 2023-02-13: qty 1

## 2023-02-13 MED ORDER — NICOTINE 21 MG/24HR TD PT24
21.0000 mg | MEDICATED_PATCH | Freq: Every day | TRANSDERMAL | Status: DC
  Filled 2023-02-13 (×2): qty 1

## 2023-02-13 MED ORDER — APIXABAN 5 MG PO TABS
5.0000 mg | ORAL_TABLET | Freq: Two times a day (BID) | ORAL | Status: DC
  Administered 2023-02-13 – 2023-02-14 (×2): 5 mg via ORAL
  Filled 2023-02-13 (×2): qty 1

## 2023-02-13 MED ORDER — ESCITALOPRAM OXALATE 10 MG PO TABS
10.0000 mg | ORAL_TABLET | Freq: Every day | ORAL | Status: DC
  Administered 2023-02-13 – 2023-02-14 (×2): 10 mg via ORAL
  Filled 2023-02-13 (×2): qty 1

## 2023-02-13 MED ORDER — ONDANSETRON HCL 4 MG/2ML IJ SOLN: 4.0000 mg | Freq: Four times a day (QID) | INTRAMUSCULAR | Status: DC | PRN

## 2023-02-13 MED ORDER — HYDROCODONE-ACETAMINOPHEN 5-325 MG PO TABS
1.0000 | ORAL_TABLET | Freq: Four times a day (QID) | ORAL | Status: DC | PRN
  Administered 2023-02-13: 1 via ORAL
  Filled 2023-02-13: qty 1

## 2023-02-13 MED ORDER — ONDANSETRON HCL 4 MG PO TABS: 4.0000 mg | ORAL_TABLET | Freq: Four times a day (QID) | ORAL | Status: DC | PRN

## 2023-02-13 MED ORDER — ACETAMINOPHEN 325 MG PO TABS: 650.0000 mg | ORAL_TABLET | Freq: Four times a day (QID) | ORAL | Status: DC | PRN

## 2023-02-13 MED ORDER — MORPHINE SULFATE (PF) 2 MG/ML IV SOLN
2.0000 mg | Freq: Once | INTRAVENOUS | Status: AC
  Administered 2023-02-13: 2 mg via INTRAVENOUS
  Filled 2023-02-13: qty 1

## 2023-02-13 NOTE — ED Notes (Signed)
Left leg is noticeably more swollen than the right leg. Left foot is mottled, great toe is discolored. Cap refill is brisk. Pedal pulse is strong. Patient c/o bilateral leg pain.

## 2023-02-13 NOTE — ED Provider Notes (Signed)
[facility] Provider Note    Event Date/Time   First MD Initiated Contact with Patient 02/13/23 1122     (approximate)   History   Leg Pain   HPI  Brianna Ashley is a 37 y.o. female with PMH of DVT, factor V Leiden, seizures, asthma, tobacco abuse presents for evaluation of bilateral leg and feet pain.  Patient was admitted to the hospital in May for bilateral DVTs and was recommended to be on Eliquis for the rest of her life.  She reports that she has not been taking the Eliquis she does not have a primary care provider.  Patient is homeless which is made it very difficult to get medical care.  She reports occasional chest pain and shortness of breath.     Physical Exam   Triage Vital Signs: ED Triage Vitals  Encounter Vitals Group     BP 02/13/23 1027 (!) 120/59     Systolic BP Percentile --      Diastolic BP Percentile --      Pulse Rate 02/13/23 1026 81     Resp 02/13/23 1026 17     Temp 02/13/23 1026 (!) 97.5 F (36.4 C)     Temp Source 02/13/23 1026 Oral     SpO2 02/13/23 1026 100 %     Weight 02/13/23 1026 205 lb 7.5 oz (93.2 kg)     Height 02/13/23 1026 5\' 5"  (1.651 m)     Head Circumference --      Peak Flow --      Pain Score 02/13/23 1026 10     Pain Loc --      Pain Education --      Exclude from Growth Chart --     Most recent vital signs: Vitals:   02/13/23 1027 02/13/23 1530  BP: (!) 120/59 (!) 94/49  Pulse:  (!) 58  Resp:    Temp:    SpO2:  98%     General: Awake, significant distress due to pain, tearful. CV:  Good peripheral perfusion.  RRR. Resp:  Normal effort. CTAB. Abd:  No distention.  Other:  Very tender to light palpation throughout the bilateral legs, dorsalis pedis pulses 2+ and regular, capillary refill appropriate in bilateral toes, left great toe is bluish in color and cool to touch.   ED Results / Procedures / Treatments   Labs (all labs ordered are listed, but only abnormal results are  displayed) Labs Reviewed  URINALYSIS, ROUTINE W REFLEX MICROSCOPIC - Abnormal; Notable for the following components:      Result Value   Color, Urine YELLOW (*)    APPearance HAZY (*)    All other components within normal limits  CBC WITH DIFFERENTIAL/PLATELET - Abnormal; Notable for the following components:   Hemoglobin 11.8 (*)    HCT 35.4 (*)    All other components within normal limits  COMPREHENSIVE METABOLIC PANEL - Abnormal; Notable for the following components:   Calcium 8.6 (*)    All other components within normal limits  POC URINE PREG, ED     RADIOLOGY  Venous ultrasound of the bilateral lower extremities obtained, interpreted the images as well as reviewed the radiologist report.  Patient does not have a DVT in the right leg but does have chronic clots in the left.  Further details below.  IMPRESSION:  1. No evidence of right lower extremity deep venous thrombosis.  2. Similar chronic nonocclusive mural thrombus in the left common  femoral, proximal to mid femoral and popliteal veins. In the distal  femoral vein, luminal narrowing appears somewhat more prominent and  subtle component of acute on chronic thrombus would be difficult to  exclude.  3. Nonvisualization of left peroneal vein and poor visualization of  left posterior tibial vein. Some component of posterior tibial vein  thrombus suspected.   PROCEDURES:  Critical Care performed: No  Procedures   MEDICATIONS ORDERED IN ED: Medications  oxyCODONE-acetaminophen (PERCOCET/ROXICET) 5-325 MG per tablet 1 tablet (1 tablet Oral Given 02/13/23 1509)     IMPRESSION / MDM / ASSESSMENT AND PLAN / ED COURSE  I reviewed the triage vital signs and the nursing notes.                             37 year old female presents for evaluation of bilateral leg and foot pain.  Vital signs are stable, patient in moderate distress on exam due to pain.  Differential diagnosis includes, but is not limited to, DVT,  pulmonary embolism, muscle strain, homelessness.   Patient's presentation is most consistent with acute complicated illness / injury requiring diagnostic workup.  CMP and CBC unremarkable aside from mild anemia.  Pregnancy test is negative.  UA was normal.  Bilateral venous ultrasound obtained as patient has a history of DVT and factor V Leiden and was recommended on her previous hospital admission to be on Eliquis for the rest of her life.  Patient states that she has not been taking the medication she is not able to get it prescribed because she does not have a PCP and is homeless.  There was no DVT in the right lower extremity.  She does have chronic nonocclusive clots in the left leg, further details can be seen in the radiology portion of the note above.  I believe patient is most appropriate for hospital admission as she needs to begin anticoagulation therapy and she has significant barriers to care due to her being homeless.  I am concerned that if she was discharged she would not be able to get her medication which would result in further progression of the DVTs.  She will need to speak with social work and [facility] to help coordinate outpatient management of her DVTs. I spoke with the hospitalist who was agreeable to admit.  Patient was stable at the time of admission.     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   Cameron Ali, PA-C 02/13/23 1616    Pilar Jarvis, MD 02/13/23 [phone]

## 2023-02-13 NOTE — H&P (Signed)
History and Physical    Brianna Ashley GGY:[id number] DOB: 06/29/1985 DOA: 02/13/2023  PCP: Patient, No Pcp Per (Confirm with patient/family/NH records and if not entered, this has to be entered at [facility] point of entry) Patient coming from: Home  I have personally briefly reviewed patient's old medical records in [facility]  Chief Complaint: Leg swelling  HPI: Brianna Ashley is a 37 y.o. female with medical history significant of DVT, factor V Leyden mutation heterozygous, asthma mild intermittent, seizure disorder, presented with worsening of leg swelling.  Patient admitted that due to lack of PCP she has not been able to refill her Eliquis and she stopped taking Eliquis sometime in June this year.  Last few weeks she has been having increasing heaviness and swelling of bilateral legs left> right but denied any significant pain associated with swelling.  Denies any chest pain shortness of breath.  She is homeless.  ED Course: Afebrile, none tachycardia nonhypotensive nonhypoxic.  DVT study showed chronic DVT of right leg, negative DVT on left side.  Review of Systems: As per HPI otherwise 14 point review of systems negative.    Past Medical History:  Diagnosis Date   Asthma    Carpal tunnel syndrome    DVT of lower extremity (deep venous thrombosis) (HCC)    Factor 5 Leiden mutation, heterozygous (HCC)    Migraine    Seizures (HCC)     Past Surgical History:  Procedure Laterality Date   ABSCESS DRAINAGE     CESAREAN SECTION     DILATION AND CURETTAGE OF UTERUS     INCISION AND DRAINAGE PERIRECTAL ABSCESS Right 09/18/2021   Procedure: IRRIGATION AND DEBRIDEMENT PERIRECTAL ABSCESS;  Surgeon: Campbell Lerner, MD;  Location: [facility] ORS;  Service: General;  Laterality: Right;   TUBAL LIGATION  "2010"     reports that she has been smoking cigarettes. She has a 20 pack-year smoking history. She has never used smokeless tobacco. She reports that she does not drink alcohol and  does not use drugs.  Allergies  Allergen Reactions   Ambien [Zolpidem Tartrate] Other (See Comments)    death   Ambien [Zolpidem Tartrate]    Clindamycin/Lincomycin Nausea And Vomiting   Keflex [Cephalexin] Nausea And Vomiting   Methylphenidate Other (See Comments)    "I pretty much died in my dad's arms"   Ritalin [Methylphenidate Hcl] Other (See Comments)    death   Ritalin [Methylphenidate Hcl]    Zolpidem Other (See Comments)    "I pretty much died a few times"    Family History  Problem Relation Age of Onset   Factor V Leiden deficiency Mother        deceased of drug overdose     Prior to Admission medications   Medication Sig Start Date End Date Taking? Authorizing Provider  acetaminophen (TYLENOL) 325 MG tablet Take 2 tablets (650 mg total) by mouth every 6 (six) hours as needed for mild pain, moderate pain, fever or headache (or Fever >/= 101). Patient not taking: Reported on 02/13/2023 08/19/22   Alford Highland, MD  albuterol (VENTOLIN HFA) 108 (90 Base) MCG/ACT inhaler Inhale 2 puffs into the lungs every 6 (six) hours as needed for wheezing or shortness of breath. Patient not taking: Reported on 02/13/2023 08/19/22   Alford Highland, MD  apixaban (ELIQUIS) 5 MG TABS tablet Take 2 tablets by mouth twice a day for six days, then take one tablet by mouth twice a day afterwards Patient not taking: Reported on  02/13/2023 08/19/22   Alford Highland, MD  escitalopram (LEXAPRO) 10 MG tablet Take 1 tablet (10 mg total) by mouth daily. Patient not taking: Reported on 02/13/2023 08/22/22   Arnetha Courser, MD  HYDROcodone-acetaminophen (NORCO/VICODIN) 5-325 MG tablet Take 1 tablet by mouth every 6 (six) hours as needed. Patient not taking: Reported on 02/13/2023 08/19/22   Alford Highland, MD    Physical Exam: Vitals:   02/13/23 1026 02/13/23 1027 02/13/23 1530 02/13/23 1645  BP:  (!) 120/59 (!) 94/49   Pulse: 81  (!) 58 61  Resp: 17   20  Temp: (!) 97.5 F (36.4 C)      TempSrc: Oral     SpO2: 100%  98% 100%  Weight: 93.2 kg     Height: 5\' 5"  (1.651 m)       Constitutional: NAD, calm, comfortable Vitals:   02/13/23 1026 02/13/23 1027 02/13/23 1530 02/13/23 1645  BP:  (!) 120/59 (!) 94/49   Pulse: 81  (!) 58 61  Resp: 17   20  Temp: (!) 97.5 F (36.4 C)     TempSrc: Oral     SpO2: 100%  98% 100%  Weight: 93.2 kg     Height: 5\' 5"  (1.651 m)      Eyes: PERRL, lids and conjunctivae normal ENMT: Mucous membranes are moist. Posterior pharynx clear of any exudate or lesions.Normal dentition.  Neck: normal, supple, no masses, no thyromegaly Respiratory: clear to auscultation bilaterally, no wheezing, no crackles. Normal respiratory effort. No accessory muscle use.  Cardiovascular: Regular rate and rhythm, no murmurs / rubs / gallops. 1+ extremity edema. 2+ pedal pulses. No carotid bruits.  Abdomen: no tenderness, no masses palpated. No hepatosplenomegaly. Bowel sounds positive.  Musculoskeletal: no clubbing / cyanosis. No joint deformity upper and lower extremities. Good ROM, no contractures. Normal muscle tone.  Skin: no rashes, lesions, ulcers. No induration Neurologic: CN 2-12 grossly intact. Sensation intact, DTR normal. Strength 5/5 in all 4.  Psychiatric: Normal judgment and insight. Alert and oriented x 3. Normal mood.     Labs on Admission: I have personally reviewed following labs and imaging studies  CBC: Recent Labs  Lab 02/13/23 1401  WBC 8.6  NEUTROABS 4.6  HGB 11.8*  HCT 35.4*  MCV 88.9  PLT 216   Basic Metabolic Panel: Recent Labs  Lab 02/13/23 1401  NA 135  K 3.7  CL 106  CO2 24  GLUCOSE 94  BUN 14  CREATININE 0.77  CALCIUM 8.6*   GFR: Estimated Creatinine Clearance: 109.7 mL/min (by C-G formula based on SCr of 0.77 mg/dL). Liver Function Tests: Recent Labs  Lab 02/13/23 1401  AST 15  ALT 11  ALKPHOS 55  BILITOT 0.8  PROT 6.7  ALBUMIN 4.0   No results for input(s): "LIPASE", "AMYLASE" in the last 168  hours. No results for input(s): "AMMONIA" in the last 168 hours. Coagulation Profile: No results for input(s): "INR", "PROTIME" in the last 168 hours. Cardiac Enzymes: No results for input(s): "CKTOTAL", "CKMB", "CKMBINDEX", "TROPONINI" in the last 168 hours. BNP (last 3 results) No results for input(s): "PROBNP" in the last 8760 hours. HbA1C: No results for input(s): "HGBA1C" in the last 72 hours. CBG: No results for input(s): "GLUCAP" in the last 168 hours. Lipid Profile: No results for input(s): "CHOL", "HDL", "LDLCALC", "TRIG", "CHOLHDL", "LDLDIRECT" in the last 72 hours. Thyroid Function Tests: No results for input(s): "TSH", "T4TOTAL", "FREET4", "T3FREE", "THYROIDAB" in the last 72 hours. Anemia Panel: No results for input(s): "VITAMINB12", "  FOLATE", "FERRITIN", "TIBC", "IRON", "RETICCTPCT" in the last 72 hours. Urine analysis:    Component Value Date/Time   COLORURINE YELLOW (A) 02/13/2023 1028   APPEARANCEUR HAZY (A) 02/13/2023 1028   APPEARANCEUR Cloudy 10/01/2013 1205   LABSPEC 1.008 02/13/2023 1028   LABSPEC 1.026 10/01/2013 1205   PHURINE 6.0 02/13/2023 1028   GLUCOSEU NEGATIVE 02/13/2023 1028   GLUCOSEU Negative 10/01/2013 1205   HGBUR NEGATIVE 02/13/2023 1028   BILIRUBINUR NEGATIVE 02/13/2023 1028   BILIRUBINUR 1+ 10/01/2013 1205   KETONESUR NEGATIVE 02/13/2023 1028   PROTEINUR NEGATIVE 02/13/2023 1028   NITRITE NEGATIVE 02/13/2023 1028   LEUKOCYTESUR NEGATIVE 02/13/2023 1028   LEUKOCYTESUR 1+ 10/01/2013 1205    Radiological Exams on Admission: US Venous Img Lower Bilateral (DVT)  Result Date: 02/13/2023 CLINICAL DATA:  Bilateral lower extremity edema and history of bilateral lower extremity DVT. EXAM: BILATERAL LOWER EXTREMITY VENOUS DOPPLER ULTRASOUND TECHNIQUE: Gray-scale sonography with graded compression, as well as color Doppler and duplex ultrasound were performed to evaluate the lower extremity deep venous systems from the level of the common femoral  vein and including the common femoral, femoral, profunda femoral, popliteal and calf veins including the posterior tibial, peroneal and gastrocnemius veins when visible. The superficial great saphenous vein was also interrogated. Spectral Doppler was utilized to evaluate flow at rest and with distal augmentation maneuvers in the common femoral, femoral and popliteal veins. COMPARISON:  08/31/2022 FINDINGS: RIGHT LOWER EXTREMITY Common Femoral Vein: No evidence of thrombus. Normal compressibility, respiratory phasicity and response to augmentation. Saphenofemoral Junction: No evidence of thrombus. Normal compressibility and flow on color Doppler imaging. Profunda Femoral Vein: No evidence of thrombus. Normal compressibility and flow on color Doppler imaging. Femoral Vein: No evidence of thrombus. Normal compressibility, respiratory phasicity and response to augmentation. Popliteal Vein: No evidence of thrombus. Normal compressibility, respiratory phasicity and response to augmentation. Calf Veins: No evidence of thrombus. Normal compressibility and flow on color Doppler imaging. Superficial Great Saphenous Vein: No evidence of thrombus. Normal compressibility. Venous Reflux:  None. Other Findings: No evidence of superficial thrombophlebitis or abnormal fluid collection. LEFT LOWER EXTREMITY Common Femoral Vein: Similar chronic web formation and echogenic mural thrombus related to prior DVT. This is nonocclusive and patent color flow present throughout the vein. Saphenofemoral Junction: No evidence of thrombus. Normal compressibility and flow on color Doppler imaging. Profunda Femoral Vein: No evidence of thrombus. Normal compressibility and flow on color Doppler imaging. Femoral Vein: Similar chronic mural thrombus which is nonocclusive in the proximal to mid femoral vein. In the distal femoral vein, luminal narrowing appears somewhat more prominent and subtle component of acute on chronic thrombus would be difficult  to exclude. Popliteal Vein: Chronic echogenic mural thrombus again visualized which is nonocclusive. This appears similar to the prior study. Calf Veins: Nonvisualization of peroneal vein and poor visualization of posterior tibial vein. Some component of posterior tibial vein thrombus suspected. Superficial Great Saphenous Vein: No evidence of thrombus. Normal compressibility. Venous Reflux:  None. Other Findings: No evidence of superficial thrombophlebitis or abnormal fluid collection. IMPRESSION: 1. No evidence of right lower extremity deep venous thrombosis. 2. 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 In the distal femoral vein, luminal narrowing appears somewhat more prominent and subtle component of acute on chronic thrombus would be difficult to exclude. 3. Nonvisualization of left peroneal vein and poor visualization of left posterior tibial vein. Some component of posterior tibial vein thrombus suspected. Electronically Signed   By:  Irish Lack M.D.   On: 02/13/2023 12:57    EKG: None  Assessment/Plan Principal Problem:   DVT (deep venous thrombosis) (HCC) Active Problems:   Factor V Leiden (HCC)  (please populate well all problems here in Problem List. (For example, if patient is on BP meds at home and you resume or decide to hold them, it is a problem that needs to be her. Same for CAD, COPD, HLD and so on)  Acute ambulation impairment -Secondary to chronic DVT symptoms, from noncoherent with Eliquis. -Patient is homeless but does have Medicaid.  Will consult case management for patient set up PCP care. -Continue Eliquis  Factor V Leiden mutation -With recurrent DVT secondary to noncompliance/noncompliance -Lifelong anticoagulation  Cigarette smoking -Long discussion with patient regarding the significant risk of cigarette smoke contributing to her recurrent DVT.  However patient has no interest of quitting  smoking -Nicotine patch  Mild intermittent asthma -No acute concern, continue as needed albuterol  DVT prophylaxis: Eliquis Code Status: Full code Family Communication: None at bedside Disposition Plan: Expect less than 2 midnight hospital stay Consults called: None Admission status: MedSurg observation   Emeline General MD [facility] Pager [phone]  02/13/2023, 6:11 PM

## 2023-02-13 NOTE — ED Triage Notes (Signed)
Pt here with bilateral leg and feet pain. Pt states the pain starts in her leg and radiates up to her back. Pt states she is homeless so she is on her feet a lot. Pt ambulatory to triage.

## 2023-02-14 DIAGNOSIS — I825Y2 Chronic embolism and thrombosis of unspecified deep veins of left proximal lower extremity: Secondary | ICD-10-CM | POA: Diagnosis not present

## 2023-02-14 MED ORDER — HYDROCODONE-ACETAMINOPHEN 5-325 MG PO TABS: 1.0000 | ORAL_TABLET | Freq: Two times a day (BID) | ORAL | 0 refills | Status: DC | PRN

## 2023-02-14 MED ORDER — NICOTINE 21 MG/24HR TD PT24: 21.0000 mg | MEDICATED_PATCH | Freq: Every day | TRANSDERMAL | 0 refills | Status: DC

## 2023-02-14 MED ORDER — APIXABAN 5 MG PO TABS: 5.0000 mg | ORAL_TABLET | Freq: Two times a day (BID) | ORAL | 3 refills | Status: DC

## 2023-02-14 MED ORDER — IBUPROFEN 400 MG PO TABS
400.0000 mg | ORAL_TABLET | Freq: Three times a day (TID) | ORAL | Status: DC
  Administered 2023-02-14: 400 mg via ORAL
  Filled 2023-02-14: qty 1

## 2023-02-14 NOTE — Discharge Summary (Signed)
Physician Discharge Summary   Patient: Brianna Ashley MRN: [id number] DOB: 10/20/1985  Admit date:     02/13/2023  Discharge date: 02/14/23  Discharge Physician: Enedina Finner   PCP: Patient, No Pcp Per   Recommendations at discharge:   patient to establish PCP in the area  Discharge Diagnoses: Principal Problem:   DVT (deep venous thrombosis) (HCC) Active Problems:   Factor V Leiden (HCC)   Brianna Ashley is a 37 y.o. female with medical history significant of DVT, factor V Leyden mutation heterozygous, asthma mild intermittent, seizure disorder, presented with worsening of leg swelling.   Patient admitted that due to lack of PCP she has not been able to refill her Eliquis and she stopped taking Eliquis sometime in June this year.  Ultrasound bilateral lower extremity 1. No evidence of right lower extremity deep venous thrombosis. 2. 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 In the distal femoral vein, luminal narrowing appears somewhat more prominent and subtle component of acute on chronic thrombus would be difficult to exclude. 3. Nonvisualization of left peroneal vein and poor visualization of left posterior tibial vein. Some component of posterior tibial vein thrombus suspected.  Left lower extremity pain history of DVT chronic noncompliance to eliquis factor V Leiden deficiency -- discussed with patient regarding importance of continuity with eliquis. I have called in prescription to [facility] pharmacy with few refills. -- Have asked her to establish PCP in the area -- advised her to follow-up with vascular surgery for her varicose vein  Tobacco abuse -- advised cessation  Overall hemodynamically stable will discharge patient to home.       Pain control - [employer] Controlled Substance Reporting System database was reviewed. and patient was instructed, not to drive, operate heavy  machinery, perform activities at heights, swimming or participation in water activities or provide baby-sitting services while on Pain, Sleep and Anxiety Medications; until their outpatient Physician has advised to do so again. Also recommended to not to take more than prescribed Pain, Sleep and Anxiety Medications.  Diet recommendation:  Discharge Diet Orders (From admission, onward)     Start     Ordered   02/14/23 0000  Diet general        02/14/23 1048            DISCHARGE MEDICATION: Allergies as of 02/14/2023       Reactions   Ambien [zolpidem Tartrate] Other (See Comments)   death   Ambien [zolpidem Tartrate]    Clindamycin/lincomycin Nausea And Vomiting   Keflex [cephalexin] Nausea And Vomiting   Methylphenidate Other (See Comments)   "I pretty much died in my dad's arms"   Ritalin [methylphenidate Hcl] Other (See Comments)   death   Ritalin [methylphenidate Hcl]    Zolpidem Other (See Comments)   "I pretty much died a few times"        Medication List     STOP taking these medications    acetaminophen 325 MG tablet Commonly known as: TYLENOL   escitalopram 10 MG tablet Commonly known as: LEXAPRO   Ventolin HFA 108 (90 Base) MCG/ACT inhaler Generic drug: albuterol       TAKE these medications    apixaban 5 MG Tabs tablet Commonly known as: ELIQUIS Take 1 tablet (5 mg total) by mouth 2 (two) times daily. What changed:  how much to take how to take this when to take this additional instructions   HYDROcodone-acetaminophen  5-325 MG tablet Commonly known as: NORCO/VICODIN Take 1 tablet by mouth every 12 (twelve) hours as needed. What changed: when to take this   nicotine 21 mg/24hr patch Commonly known as: NICODERM CQ - dosed in mg/24 hours Place 1 patch (21 mg total) onto the skin daily. Start taking on: February 15, 2023        Discharge Exam: Ceasar [facility] Weights   02/13/23 1026  Weight: 93.2 kg   Morbid obesity cardiovascular both  heart sounds normal respiratory clear to auscultation no respiratory distress bilateral lower extremity no edema. Superficial  varicose veins left lower extremity   Condition at discharge: fair  The results of significant diagnostics from this hospitalization (including imaging, microbiology, ancillary and laboratory) are listed below for reference.   Imaging Studies: US Venous Img Lower Bilateral (DVT)  Result Date: 02/13/2023 CLINICAL DATA:  Bilateral lower extremity edema and history of bilateral lower extremity DVT. EXAM: BILATERAL LOWER EXTREMITY VENOUS DOPPLER ULTRASOUND TECHNIQUE: Gray-scale sonography with graded compression, as well as color Doppler and duplex ultrasound were performed to evaluate the lower extremity deep venous systems from the level of the common femoral vein and including the common femoral, femoral, profunda femoral, popliteal and calf veins including the posterior tibial, peroneal and gastrocnemius veins when visible. The superficial great saphenous vein was also interrogated. Spectral Doppler was utilized to evaluate flow at rest and with distal augmentation maneuvers in the common femoral, femoral and popliteal veins. COMPARISON:  08/31/2022 FINDINGS: RIGHT LOWER EXTREMITY Common Femoral Vein: No evidence of thrombus. Normal compressibility, respiratory phasicity and response to augmentation. Saphenofemoral Junction: No evidence of thrombus. Normal compressibility and flow on color Doppler imaging. Profunda Femoral Vein: No evidence of thrombus. Normal compressibility and flow on color Doppler imaging. Femoral Vein: No evidence of thrombus. Normal compressibility, respiratory phasicity and response to augmentation. Popliteal Vein: No evidence of thrombus. Normal compressibility, respiratory phasicity and response to augmentation. Calf Veins: No evidence of thrombus. Normal compressibility and flow on color Doppler imaging. Superficial Great Saphenous Vein: No evidence of  thrombus. Normal compressibility. Venous Reflux:  None. Other Findings: No evidence of superficial thrombophlebitis or abnormal fluid collection. LEFT LOWER EXTREMITY Common Femoral Vein: Similar chronic web formation and echogenic mural thrombus related to prior DVT. This is nonocclusive and patent color flow present throughout the vein. Saphenofemoral Junction: No evidence of thrombus. Normal compressibility and flow on color Doppler imaging. Profunda Femoral Vein: No evidence of thrombus. Normal compressibility and flow on color Doppler imaging. Femoral Vein: Similar chronic mural thrombus which is nonocclusive in the proximal to mid femoral vein. In the distal femoral vein, luminal narrowing appears somewhat more prominent and subtle component of acute on chronic thrombus would be difficult to exclude. Popliteal Vein: Chronic echogenic mural thrombus again visualized which is nonocclusive. This appears similar to the prior study. Calf Veins: Nonvisualization of peroneal vein and poor visualization of posterior tibial vein. Some component of posterior tibial vein thrombus suspected. Superficial Great Saphenous Vein: No evidence of thrombus. Normal compressibility. Venous Reflux:  None. Other Findings: No evidence of superficial thrombophlebitis or abnormal fluid collection. IMPRESSION: 1. No evidence of right lower extremity deep venous thrombosis. 2. 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 In the distal femoral vein, luminal narrowing appears somewhat more prominent and subtle component of acute on chronic thrombus would be difficult to exclude. 3. Nonvisualization of left peroneal vein and poor visualization of left posterior tibial vein. Some component of  posterior tibial vein thrombus suspected. Electronically Signed   By: Irish Lack M.D.   On: 02/13/2023 12:57    Microbiology: Results for orders placed or performed during  the hospital encounter of 02/02/22  Group A Strep by PCR ([facility] Only)     Status: Abnormal   Collection Time: 02/02/22 11:06 AM   Specimen: Throat; Sterile Swab  Result Value Ref Range Status   Group A Strep by PCR DETECTED (A) NOT DETECTED Final    Comment: Performed at [facility], [address], [place], [place] [address]  Resp Panel by RT-PCR (Flu A&B, Covid) Throat     Status: None   Collection Time: 02/02/22 11:06 AM   Specimen: Throat; Nasal Swab  Result Value Ref Range Status   SARS Coronavirus 2 by RT PCR NEGATIVE NEGATIVE Final    Comment: (NOTE) SARS-CoV-2 target nucleic acids are NOT DETECTED.  The SARS-CoV-2 RNA is generally detectable in upper respiratory specimens during the acute phase of infection. The lowest concentration of SARS-CoV-2 viral copies this assay can detect is 138 copies/mL. A negative result does not preclude SARS-Cov-2 infection and should not be used as the sole basis for treatment or other patient management decisions. A negative result may occur with  improper specimen collection/handling, submission of specimen other than nasopharyngeal swab, presence of viral mutation(s) within the areas targeted by this assay, and inadequate number of viral copies(<138 copies/mL). A negative result must be combined with clinical observations, patient history, and epidemiological information. The expected result is Negative.  Fact Sheet for Patients:  BloggerCourse.com  Fact Sheet for Healthcare Providers:  SeriousBroker.it  This test is no t yet approved or cleared by the [place] FDA and  has been authorized for detection and/or diagnosis of SARS-CoV-2 by FDA under an Emergency Use Authorization (EUA). This EUA will remain  in effect (meaning this test can be used) for the duration of the COVID-19 declaration under Section 564(b)(1) of the Act, 21 U.S.C.section 360bbb-3(b)(1), unless the  authorization is terminated  or revoked sooner.       Influenza A by PCR NEGATIVE NEGATIVE Final   Influenza B by PCR NEGATIVE NEGATIVE Final    Comment: (NOTE) The Xpert Xpress SARS-CoV-2/FLU/RSV plus assay is intended as an aid in the diagnosis of influenza from Nasopharyngeal swab specimens and should not be used as a sole basis for treatment. Nasal washings and aspirates are unacceptable for Xpert Xpress SARS-CoV-2/FLU/RSV testing.  Fact Sheet for Patients: BloggerCourse.com  Fact Sheet for Healthcare Providers: SeriousBroker.it  This test is not yet approved or cleared by the [place] FDA and has been authorized for detection and/or diagnosis of SARS-CoV-2 by FDA under an Emergency Use Authorization (EUA). This EUA will remain in effect (meaning this test can be used) for the duration of the COVID-19 declaration under Section 564(b)(1) of the Act, 21 U.S.C. section 360bbb-3(b)(1), unless the authorization is terminated or revoked.  Performed at [facility], [address] [place], [place] [address]     Labs: CBC: Recent Labs  Lab 02/13/23 1401  WBC 8.6  NEUTROABS 4.6  HGB 11.8*  HCT 35.4*  MCV 88.9  PLT 216   Basic Metabolic Panel: Recent Labs  Lab 02/13/23 1401  NA 135  K 3.7  CL 106  CO2 24  GLUCOSE 94  BUN 14  CREATININE 0.77  CALCIUM 8.6*   Liver Function Tests: Recent Labs  Lab 02/13/23 1401  AST 15  ALT 11  ALKPHOS 55  BILITOT 0.8  PROT 6.7  ALBUMIN 4.0     Discharge time spent: greater than 30 minutes.  Signed: Enedina Finner, MD [facility] 02/14/2023

## 2023-02-14 NOTE — Progress Notes (Signed)
IV removed, patient took shower per MD order. Discharge instructions completed.  Cornell Barman Mikaiya Tramble

## 2023-02-14 NOTE — TOC CM/SW Note (Signed)
Update received from Attending MD, patient to DC today and agreeable to picking up medications from [facility]. Husband to transport.  CSW added food, housing, and transportation resources to AVS.  Alfonso Ramus, LCSW Transitions of Care Department [phone]

## 2023-02-14 NOTE — Plan of Care (Signed)
Adequate for discharge.

## 2023-02-14 NOTE — Discharge Instructions (Addendum)
Pt advised to f/u Vascular surgery for her varicose veins Pt advised to f/u Podiatry or Orthopedic in the  area for possible plantar facscitis     Food Resources  Agency Name: [facility] Agency Address: [address], [place], [place] [address] Phone: [phone] Website: [email or web address] Service(s) Offered: Housing services, self-sufficiency, congregate meal program, weatherization program, Event organiser program, emergency food assistance,  housing counseling, home ownership program, wheels - to work program.  [employer] free for 60 and older at various locations from [employer], Monday-Friday:  [employer], [address] [place], [phone] -[facility], [address], [place] [phone]  -[facility], [address] [place] [phone]  -[address], [place], [phone]  Agency Name: [facility] on [facility] Address: [phone] [address], [place], [place] [address] Phone: [phone] Website: [email or web address] Service(s) Offered: Home delivered hot, frozen, and emergency  meals. Grocery assistance program which matches  volunteers one-on-one with seniors unable to grocery shop  for themselves. Must be 60 years and older; less than 20  hours of in-home aide service, limited or no driving ability;  live alone or with someone with a disability; live in  [place].  Agency Name: [facility] [employer]) Address: [address], [place], [place] [address] Phone: [phone] Service(s) Offered: Food is served to shut-ins, homeless, elderly, and low income people in the community every Saturday (11:30 am-12:30 pm) and Sunday (12:30 pm-1:30pm). Volunteers also offer help and encouragement in seeking employment,  and spiritual guidance.  Agency Name: Department of Social Services Address: [address] [place], [place]  [address] Phone: [phone] Service(s) Offered: Child support services; child welfare services; food stamps; Medicaid; work first family assistance; and aid with fuel,  rent, food and medicine.  Agency Name: Dietitian Address: [address], [place], [place] Phone: [phone] Website: [email or web address] Services Offered: Monday 10:00am-12:00, 8:00pm-9:00pm, and Friday 10:00am-12:00.  Agency Name: [employer] [facility] [place] Address: [address] [place], [place] [address] Phone: [phone] Website: [email or web address] Service(s) Offered: Serves weekday meals, open from 11:30 am- 1:00 pm., and 6:30-7:30pm, Monday-Wednesday-Friday distributes food 3:30-6pm, Monday-Wednesday-Friday.  Agency Name: [facility] Address: [address] [place], [place] Phone: [phone] Website: [email or web address] Services Offered: Distributes food the 4th Saturday of the month, starting at 8:00 am  Agency Name: [facility] Address: [phone] [address] [place], [place] [address] Phone: [phone] Website: [email or web address] Service(s) Offered: Bread of life, weekly food pantry. Open Wednesdays from 10:00am-noon.  Agency Name: [facility] [employer] [facility] Address: [address] [place], [place], [place] Phone: [phone] Services Offered: Distributes food 9am-1pm, Monday-Thursday. Call for details.  Agency Name: [facility] Address: [address] [place], [place] [address] Phone: [phone] Website: [email or web address] Service(s) Offered: Games developer. Call for assistance.  Agency Name: [facility] Address: [address] [place], [place] [address] Phone: [phone] Service Offered: Emergency Food Pantry. Call for appointment.  Agency Name: [facility] Address: [address], [place], [place] [address] Phone: [phone] Website:  [email or web address] Services Offered: Games developer. Call for details  Agency Name: [facility] Address: [address] [place], [place] Phone: [phone] Website: [email or web address] Service(s) Offered: Emergency Food Pantry. Call for details.  Agency Name: Holiday representative Address: [address] [place], [place] [address] Phone: [phone] or [phone] Website: [email or web address] Service(s) Offered: Distribute food 9am-11:30 am, Tuesday-Friday, and 1-3:30pm, Monday-Friday. Food pantry Monday-Friday 1pm-3pm, fresh items, Mon.-Wed.-Fri.  Agency Name: [facility] Address: [address] [place], [place] [address] Phone: [phone] Website: [email or web address] Services Offered: Distribute food Tues and Sats from 9:00am-noon. Closed 1st Saturday of each month. Call for details  Agency Name: [facility] [employer] Address: Reynaldo [facility] [address] [place] [address] Phone: [phone]  Services Offered: Delivers meals every Thursday   Transportation Resources  Agency Name: [facility] Agency Address: [address] [place], [place] [address] Phone: [phone] Email: [email or web address] Website: [email or web address] Service(s) Offered: Housing services, self-sufficiency, congregate meal program, weatherization program, Field seismologist program, emergency food assistance,  housing counseling, home ownership program, wheels-towork program.  Agency Name: [facility] [employer] [phone]) Address: [address], [place], [place] [address] Phone: [phone] Website: [email or web address].[email or web address] Service(s) Offered: Transportation for [employer], subscription and demand response; Dial-a-Ride for citizens 68 years of age or older.  Agency Name: Department of Social Services Address: [address] [place], [place] [address] Phone:  [phone] Service(s) Offered: Child support services; child welfare services; food stamps; Medicaid; work first family assistance; and aid with fuel,  rent, food and medicine, transportation assistance.  Agency Name: Disabled [employer] (DAV) Transportation  Network Phone: [phone] Service(s) Offered: Transports veterans to the [facility] medical center. Call  forty-eight hours in advance and leave the name, telephone  number, date, and time of appointment. Veteran will be  contacted by the driver the day before the appointment to  arrange a pick up point   Transportation Resources  Agency Name: [facility] Agency Address: [address] [place], [place] [address] Phone: [phone] Email: [email or web address] Website: [email or web address] Service(s) Offered: Housing services, self-sufficiency, congregate meal program, weatherization program, Field seismologist program, emergency food assistance,  housing counseling, home ownership program, wheels-towork program.  Agency Name: [facility] [employer] [phone]) Address: [address], [place], [place] [address] Phone: [phone] Website: [email or web address].[email or web address] Service(s) Offered: Transportation for [employer], subscription and demand response; Dial-a-Ride for citizens 5 years of age or older.  Agency Name: Department of Social Services Address: [address] [place], [place] [address] Phone: [phone] Service(s) Offered: Child support services; child welfare services; food stamps; Medicaid; work first family assistance; and aid with fuel,  rent, food and medicine, transportation assistance.  Agency Name: Disabled [employer] (DAV) Transportation  Network Phone: [phone] Service(s) Offered: Transports veterans to the [facility] medical center. Call  forty-eight hours in advance and leave the name, telephone  number, date, and time  of appointment. Veteran will be  contacted by the driver the day before the appointment to  arrange a pick up point    [employer] ACTA currently provides door to door services. ACTA connects with PART daily for services to [facility]. ACTA also performs contract services to [employer] operates 27 vehicles, all but 3 mini-vans are equipped with lifts for special needs as well as the general public. ACTA drivers are each CDL certified and trained in First Aid and CPR. ACTA was established in 2002 by [employer]. An independent Industrial/product designer. ACTA operates via Cytogeneticist with required Research scientist (physical sciences) from [place]. ACTA provides over 80,000 passenger trips each year, including [facility] and [employer] sites.  Call at least by 11 AM one business day prior to needing transportation  [employer].  [place], [place] [address]     Office Hours: Monday-Friday  8 AM - 5 PM   Rent/Utility/Housing  Agency Name: [facility] Agency Address: [address] [place], [place] [address] Phone: [phone] Email: [email or web address] Website: [email or web address] Service(s) Offered: Housing services, self-sufficiency, congregate meal program, weatherization program, Field seismologist program, emergency food assistance,  housing counseling, home ownership program, wheels -towork program.  Agency Name: [facility] Address: [address] [place], [place] [address] Phone: [phone] (8a-4p) [phone] (8p- 10p) Email: [email or web address] Website: [email or web address] Service(s) Offered: A program for homeless and/or needy men that includes one-on-one counseling, life skills training and job rehabilitation.  Agency Name: [employer] [facility]  [place] Address: [address] [place], [place] [address] Phone: [phone] Website: www.[email or web address] Service(s) Offered: Assistance to needy in emergency with utility bills, heating fuel, and prescriptions. Shelter for homeless 7pm-7am. July 17, 2016 15  Agency Name: [facility] [place] (Developmentally Disabled) Address: [address], [place], [place] [address] Phone: [phone] Contact Person: Cathleen Corti Email: [email or web address] Website: [email or web address] Service(s) Offered: Helps individuals with developmental disabilities move from housing that is more restrictive to homes where they  can achieve greater independence and have more  opportunities.  Agency Name: [facility] Address: [address], [place], [place] [address] Phone: [phone] Email: [email or web address] Website: www.[email or web address].org Service(s) Offered: Provides affordable housing for low-income families, elderly, and disabled individuals. Offer a wide range of  programs and services, from financial planning to afterschool and summer programs.  Agency Name: Department of Social Services Address: [address] [place], [place] [address] Phone: [phone] Service(s) Offered: Child support services; child welfare services; food stamps; Medicaid; work first family assistance; and aid with fuel,  rent, food and medicine.  Agency Name: [facility] [place], [employer]. Address: [facility] [address], [place], [place]  [address] Phone: [phone] Website: www.familyabuseservices.org Service(s) Offered: 24 hour Crisis Line: [phone]; 24 hour Emergency Shelter; Transitional Housing; Support Groups; Scientist, physiological; [employer]; Hispanic Outreach: [phone];  Visitation Center: [phone].  Agency Name: [facility] [place]. Address: [address], [place], [place] [address] Phone: [phone] Service(s)  Offered: CAP Services; Home and [employer]; Individual or Group Supports; Respite Care Non-Institutional Nursing;  Residential Supports; Respite Care and Personal Care Services; Transportation; Family and Friends Night; Recreational Activities; Three Nutritious Meals/Snacks; Consultation with Registered Dietician; Twenty-four hour Registered Nurse Access; Daily and [employer] and [employer]; Camp Green Leaves; [place] for the [employer] (During Summer Months) Bingo Night (Every  Wednesday Night); Special Populations Dance Night  (Every Tuesday Night); Professional Hair Care Services.  Agency Name: [facility] Address: [address], [place], [place] [address] Phone: [phone] Contact Person: Jabier Mutton Website: [email or web address].Physicist, medical) Offered: Residential treatment facility for women; food and  clothing, educational & employment development and  transportation to work; Counsellor of financial skills;  parenting and family reunification; emotional and spiritual  support; transitional housing for program graduates.  Agency Name: [facility] Address: [address] [place], [place] [address] Phone: [phone] Email: [email or web address] Website: [email or web address].es Service(s) Offered: Public housing units for elderly, disabled, and low income people; housing choice vouchers for income eligible  applicants; shelter plus care vouchers; and Psychologist, clinical.  Agency Name: [facility] [employer] Address: [address], [place], [place] [address] Phone: [phone] Email: [email or web address] Website: www.habitatalamance.org Service(s) Offered: Build houses for families in need of decent housing. Each adult in the family must invest 200  hours of labor on  someone else's house, work with volunteers to build their own house, attend classes on budgeting, home maintenance, yard care, and  attend homeowner association meetings.  Agency Name: [facility] Address: [address] [place], [place] [address] Phone: [phone] Website: www.rsli.org Service(s) Offered: Intermediate care facilities for intellectually delayed, Supervised Living in group homes for adults with developmental disabilities, Supervised Living for people who have dual diagnoses (MRMI), Independent Living, Supported Living, respite and a variety of CAP services, pre-vocational services, day supports, and [employer].  Agency Name: [place] [employer] Phone: [phone] Website: www.NCForeclosurePrevention.gov Service(s) Offered: Zero-interest, deferred loans to homeowners struggling to pay their mortgage. Call for more information.

## 2023-04-19 ENCOUNTER — Emergency Department: Payer: MEDICAID

## 2023-04-19 ENCOUNTER — Other Ambulatory Visit: Payer: Self-pay

## 2023-04-19 DIAGNOSIS — L02215 Cutaneous abscess of perineum: Secondary | ICD-10-CM | POA: Insufficient documentation

## 2023-04-19 DIAGNOSIS — Z79899 Other long term (current) drug therapy: Secondary | ICD-10-CM | POA: Insufficient documentation

## 2023-04-19 DIAGNOSIS — I82501 Chronic embolism and thrombosis of unspecified deep veins of right lower extremity: Secondary | ICD-10-CM | POA: Diagnosis not present

## 2023-04-19 DIAGNOSIS — J45909 Unspecified asthma, uncomplicated: Secondary | ICD-10-CM | POA: Insufficient documentation

## 2023-04-19 DIAGNOSIS — I82502 Chronic embolism and thrombosis of unspecified deep veins of left lower extremity: Secondary | ICD-10-CM | POA: Diagnosis not present

## 2023-04-19 DIAGNOSIS — Z91148 Patient's other noncompliance with medication regimen for other reason: Secondary | ICD-10-CM | POA: Insufficient documentation

## 2023-04-19 DIAGNOSIS — M7600 Gluteal tendinitis, unspecified hip: Secondary | ICD-10-CM | POA: Diagnosis not present

## 2023-04-19 DIAGNOSIS — Z7901 Long term (current) use of anticoagulants: Secondary | ICD-10-CM | POA: Diagnosis not present

## 2023-04-19 DIAGNOSIS — M79662 Pain in left lower leg: Secondary | ICD-10-CM | POA: Diagnosis present

## 2023-04-19 DIAGNOSIS — M79661 Pain in right lower leg: Secondary | ICD-10-CM | POA: Insufficient documentation

## 2023-04-19 DIAGNOSIS — F17210 Nicotine dependence, cigarettes, uncomplicated: Secondary | ICD-10-CM | POA: Diagnosis not present

## 2023-04-19 DIAGNOSIS — T2111XA Burn of first degree of chest wall, initial encounter: Secondary | ICD-10-CM | POA: Insufficient documentation

## 2023-04-19 DIAGNOSIS — X0801XA Exposure to bed fire due to burning cigarette, initial encounter: Secondary | ICD-10-CM | POA: Diagnosis not present

## 2023-04-19 LAB — CBC
HCT: 38.8 % (ref 36.0–46.0)
Hemoglobin: 13.2 g/dL (ref 12.0–15.0)
MCH: 29.9 pg (ref 26.0–34.0)
MCHC: 34 g/dL (ref 30.0–36.0)
MCV: 88 fL (ref 80.0–100.0)
Platelets: 258 10*3/uL (ref 150–400)
RBC: 4.41 MIL/uL (ref 3.87–5.11)
RDW: 14.4 % (ref 11.5–15.5)
WBC: 10.8 10*3/uL — ABNORMAL HIGH (ref 4.0–10.5)
nRBC: 0 % (ref 0.0–0.2)

## 2023-04-19 LAB — BASIC METABOLIC PANEL
Anion gap: 11 (ref 5–15)
BUN: 17 mg/dL (ref 6–20)
CO2: 21 mmol/L — ABNORMAL LOW (ref 22–32)
Calcium: 8.8 mg/dL — ABNORMAL LOW (ref 8.9–10.3)
Chloride: 104 mmol/L (ref 98–111)
Creatinine, Ser: 0.84 mg/dL (ref 0.44–1.00)
GFR, Estimated: >60 mL/min (ref >60)
Glucose, Bld: 99 mg/dL (ref 70–99)
Potassium: 4.1 mmol/L (ref 3.5–5.1)
Sodium: 136 mmol/L (ref 135–145)

## 2023-04-19 MED ORDER — HYDROMORPHONE HCL 1 MG/ML IJ SOLN
1.0000 mg | INTRAMUSCULAR | Status: AC
  Administered 2023-04-19: 1 mg via INTRAVENOUS
  Filled 2023-04-19: qty 1

## 2023-04-19 NOTE — ED Notes (Signed)
Pt is crying in pain. Pt moved to ED stretcher and will make MD aware to advised if she can have pain meds.

## 2023-04-19 NOTE — ED Notes (Signed)
Tech unable to get blood. Lab called.

## 2023-04-19 NOTE — ED Triage Notes (Signed)
Pt to ed from home via POV for possible blood clot in both lower legs. Pt has HX of same. Pt is caox4, in no acute distress in triage.

## 2023-04-20 ENCOUNTER — Observation Stay
Admission: EM | Admit: 2023-04-20 | Discharge: 2023-04-21 | Disposition: A | Discharge status: Home / Self Care | Payer: MEDICAID | Attending: Internal Medicine | Admitting: Internal Medicine

## 2023-04-20 ENCOUNTER — Inpatient Hospital Stay: Payer: MEDICAID

## 2023-04-20 DIAGNOSIS — I2699 Other pulmonary embolism without acute cor pulmonale: Secondary | ICD-10-CM | POA: Diagnosis present

## 2023-04-20 DIAGNOSIS — I82419 Acute embolism and thrombosis of unspecified femoral vein: Principal | ICD-10-CM

## 2023-04-20 DIAGNOSIS — I82409 Acute embolism and thrombosis of unspecified deep veins of unspecified lower extremity: Secondary | ICD-10-CM | POA: Diagnosis present

## 2023-04-20 DIAGNOSIS — I824Y3 Acute embolism and thrombosis of unspecified deep veins of proximal lower extremity, bilateral: Secondary | ICD-10-CM

## 2023-04-20 LAB — URINE DRUG SCREEN, QUALITATIVE (ARMC ONLY)
Amphetamines, Ur Screen: NOT DETECTED
Barbiturates, Ur Screen: NOT DETECTED
Benzodiazepine, Ur Scrn: NOT DETECTED
Cannabinoid 50 Ng, Ur ~~LOC~~: POSITIVE — AB
Cocaine Metabolite,Ur ~~LOC~~: NOT DETECTED
MDMA (Ecstasy)Ur Screen: NOT DETECTED
Methadone Scn, Ur: NOT DETECTED
Opiate, Ur Screen: POSITIVE — AB
Phencyclidine (PCP) Ur S: NOT DETECTED
Tricyclic, Ur Screen: NOT DETECTED

## 2023-04-20 LAB — HCG, QUANTITATIVE, PREGNANCY: hCG, Beta Chain, Quant, S: <1 m[IU]/mL (ref <5)

## 2023-04-20 MED ORDER — BACITRACIN-NEOMYCIN-POLYMYXIN OINTMENT TUBE
TOPICAL_OINTMENT | Freq: Every day | CUTANEOUS | Status: DC
  Filled 2023-04-20: qty 14.17

## 2023-04-20 MED ORDER — SODIUM CHLORIDE 0.9% FLUSH
3.0000 mL | Freq: Two times a day (BID) | INTRAVENOUS | Status: DC
  Administered 2023-04-20 (×2): 3 mL via INTRAVENOUS

## 2023-04-20 MED ORDER — ONDANSETRON HCL 4 MG PO TABS: 4.0000 mg | ORAL_TABLET | Freq: Four times a day (QID) | ORAL | Status: DC | PRN

## 2023-04-20 MED ORDER — OXYCODONE HCL 5 MG PO TABS
5.0000 mg | ORAL_TABLET | Freq: Once | ORAL | Status: AC
  Administered 2023-04-20: 5 mg via ORAL
  Filled 2023-04-20: qty 1

## 2023-04-20 MED ORDER — GABAPENTIN 300 MG PO CAPS
300.0000 mg | ORAL_CAPSULE | Freq: Three times a day (TID) | ORAL | Status: DC
  Administered 2023-04-20 – 2023-04-21 (×4): 300 mg via ORAL
  Filled 2023-04-20 (×4): qty 1

## 2023-04-20 MED ORDER — ACETAMINOPHEN 650 MG RE SUPP: 650.0000 mg | Freq: Four times a day (QID) | RECTAL | Status: DC | PRN

## 2023-04-20 MED ORDER — ACETAMINOPHEN 325 MG PO TABS: 650.0000 mg | ORAL_TABLET | Freq: Four times a day (QID) | ORAL | Status: DC | PRN

## 2023-04-20 MED ORDER — APIXABAN 5 MG PO TABS
10.0000 mg | ORAL_TABLET | Freq: Two times a day (BID) | ORAL | Status: DC
  Administered 2023-04-20 – 2023-04-21 (×4): 10 mg via ORAL
  Filled 2023-04-20 (×4): qty 2

## 2023-04-20 MED ORDER — KETOROLAC TROMETHAMINE 15 MG/ML IJ SOLN
15.0000 mg | Freq: Once | INTRAMUSCULAR | Status: AC
  Administered 2023-04-20: 15 mg via INTRAVENOUS
  Filled 2023-04-20: qty 1

## 2023-04-20 MED ORDER — NICOTINE 21 MG/24HR TD PT24
21.0000 mg | MEDICATED_PATCH | Freq: Every day | TRANSDERMAL | Status: DC
  Filled 2023-04-20: qty 1

## 2023-04-20 MED ORDER — OXYCODONE-ACETAMINOPHEN 5-325 MG PO TABS
2.0000 | ORAL_TABLET | ORAL | Status: DC | PRN
  Administered 2023-04-20 – 2023-04-21 (×6): 2 via ORAL
  Filled 2023-04-20 (×6): qty 2

## 2023-04-20 MED ORDER — MORPHINE SULFATE (PF) 4 MG/ML IV SOLN
4.0000 mg | INTRAVENOUS | Status: DC | PRN
  Administered 2023-04-20: 4 mg via INTRAVENOUS
  Filled 2023-04-20: qty 1

## 2023-04-20 MED ORDER — ENOXAPARIN SODIUM 40 MG/0.4ML IJ SOSY: 40.0000 mg | PREFILLED_SYRINGE | INTRAMUSCULAR | Status: DC

## 2023-04-20 MED ORDER — ONDANSETRON HCL 4 MG/2ML IJ SOLN: 4.0000 mg | Freq: Four times a day (QID) | INTRAMUSCULAR | Status: DC | PRN

## 2023-04-20 MED ORDER — ACETAMINOPHEN 325 MG PO TABS
650.0000 mg | ORAL_TABLET | Freq: Once | ORAL | Status: AC
Start: 2023-04-20 — End: 2023-04-20
  Administered 2023-04-20: 650 mg via ORAL
  Filled 2023-04-20: qty 2

## 2023-04-20 MED ORDER — IOHEXOL 300 MG/ML  SOLN
100.0000 mL | Freq: Once | INTRAMUSCULAR | Status: AC | PRN
  Administered 2023-04-20: 100 mL via INTRAVENOUS

## 2023-04-20 MED ORDER — APIXABAN 5 MG PO TABS: 5.0000 mg | ORAL_TABLET | Freq: Two times a day (BID) | ORAL | Status: DC

## 2023-04-20 NOTE — ED Notes (Signed)
RN to bedside to answer call bell. Pt is asking for more IV pain meds. Will mSG MD.

## 2023-04-20 NOTE — ED Notes (Signed)
Pt's breakfast tray did not come with other trays, so Pt was given a sandwich bag per request.

## 2023-04-20 NOTE — ED Notes (Signed)
This RN to bedside to introduce self to pt. Pt is caox4, in pain and un comfortable. This RN apologized for my delay in coming to see her as I was in an emergency patient. She was understanding.

## 2023-04-20 NOTE — Progress Notes (Signed)
Pt refuses any more labs to be drawn today after HCG Quant drawn this morning. Junious Silk NP aware.

## 2023-04-20 NOTE — ED Notes (Signed)
Presenter, broadcasting @ pt bs to attempt to assist pt with pain control of leg pt advised of ordered medication pt begins to shout and curse according to pt "they gave me dilaudid before what's Tylenol gonna do ? Nothing I want to talk to someone NOW I'm start cussing people out NOW!"
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
## 2023-04-20 NOTE — ED Provider Notes (Addendum)
[facility] Provider Note    Event Date/Time   First MD Initiated Contact with Patient 04/20/23 0139     (approximate)   History   DVT   HPI  Brianna Ashley is a 38 y.o. female   Past medical history of Clotting disorder and recurrent DVTs who presents to the emergency department with progressively worsening bilateral lower extremity pain and swelling concerning for DVT in the setting of not taking her Eliquis for the last 1 week because she is homeless, has no money, has no transportation.  She denies any other acute medical complaints.  She reports no chest pain or shortness of breath.   External Medical Documents Reviewed: Hospitalization from November for DVTs      Physical Exam   Triage Vital Signs: ED Triage Vitals  Encounter Vitals Group     BP 04/19/23 1943 115/79     Systolic BP Percentile --      Diastolic BP Percentile --      Pulse Rate 04/19/23 1943 95     Resp 04/19/23 1943 18     Temp 04/19/23 1943 98 F (36.7 C)     Temp Source 04/19/23 1943 Oral     SpO2 04/19/23 1943 99 %     Weight --      Height 04/19/23 1944 5\' 5"  (1.651 m)     Head Circumference --      Peak Flow --      Pain Score 04/19/23 1944 10     Pain Loc --      Pain Education --      Exclude from Growth Chart --     Most recent vital signs: Vitals:   04/19/23 1943  BP: 115/79  Pulse: 95  Resp: 18  Temp: 98 F (36.7 C)  SpO2: 99%    General: Awake, no distress.  CV:  Good peripheral perfusion.  Resp:  Normal effort.  Abd:  No distention.  Other:  Bilateral lower extremity edema, equal, popliteal tenderness to palpation in the calves as well.  No noted skin changes, neurovascular intact.   ED Results / Procedures / Treatments   Labs (all labs ordered are listed, but only abnormal results are displayed) Labs Reviewed  CBC - Abnormal; Notable for the following components:      Result Value   WBC 10.8 (*)    All other components within  normal limits  BASIC METABOLIC PANEL - Abnormal; Notable for the following components:   CO2 21 (*)    Calcium 8.8 (*)    All other components within normal limits     I ordered and reviewed the above labs they are notable for white blood cell count mildly elevated and H&H is within normal limit.     RADIOLOGY I independently reviewed and interpreted DVT ultrasound and see noncompressible veins concerning for DVT I also reviewed radiologist's formal read.   PROCEDURES:  Critical Care performed: No  Procedures   MEDICATIONS ORDERED IN ED: Medications  apixaban (ELIQUIS) tablet 10 mg (10 mg Oral Given 04/20/23 0315)    Followed by  apixaban (ELIQUIS) tablet 5 mg (has no administration in time range)  HYDROmorphone (DILAUDID) injection 1 mg (1 mg Intravenous Given 04/19/23 2202)  ketorolac (TORADOL) 15 MG/ML injection 15 mg (15 mg Intravenous Given 04/20/23 0154)  acetaminophen (TYLENOL) tablet 650 mg (650 mg Oral Given 04/20/23 0153)  oxyCODONE (Oxy IR/ROXICODONE) immediate release tablet 5 mg (5 mg Oral Given 04/20/23 0156)  External physician / consultants:  I spoke with hospital medicine for admission and regarding care plan for this patient.   IMPRESSION / MDM / ASSESSMENT AND PLAN / ED COURSE  I reviewed the triage vital signs and the nursing notes.                                Patient's presentation is most consistent with acute presentation with potential threat to life or bodily function.  Differential diagnosis includes, but is not limited to, DVT, considered but less likely PE, infection   The patient is on the cardiac monitor to evaluate for evidence of arrhythmia and/or significant heart rate changes.  MDM:    Patient with bilateral DVTs and known clotting disorder supposed to be on lifelong anticoagulation but given social factors unable to obtain medications due to multiple factors including transportation, cost.  She is currently homeless and living  with a friend but cannot live with her beyond today.  She has no money to obtain her anticoagulation.  She has significant pain from her blood clots.  I will start her on Eliquis and consult social work to figure out how she is going to get anticoagulated upon leaving hospital/pain control.  She denies chest pain or shortness of breath and hemodynamic stable doubt PE certainly not a massive PE and starting on Eliquis regardless.  -- Unfortunately, pain is pretty severe and requiring multiple doses of narcotic pain medications to the point where she is borderline hypotensive now 90s over 40s.  Plan will be admission for pain control, DVT, social work consultation.     FINAL CLINICAL IMPRESSION(S) / ED DIAGNOSES   Final diagnoses:  Acute deep vein thrombosis (DVT) of femoral vein, unspecified laterality (HCC)     Rx / DC Orders   ED Discharge Orders     None        Note:  This document was prepared using Dragon voice recognition software and may include unintentional dictation errors.    Pilar Jarvis, MD 04/20/23 6213    Pilar Jarvis, MD 04/20/23 0865    Pilar Jarvis, MD 04/20/23 [phone]

## 2023-04-20 NOTE — ED Notes (Signed)
This RN assisted pt to bathroom and back to bed in subwait.

## 2023-04-20 NOTE — ED Notes (Signed)
Dr Modesto Charon @ pt bs writer RN @ bs to assist in completion of current pain relief medication as ordered per Dr Modesto Charon - remains agitated @ this time stating multiple times the previous physician "Gave me dilaudid ! What's this Tylenol gonna do ?"
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
## 2023-04-21 ENCOUNTER — Other Ambulatory Visit: Payer: Self-pay

## 2023-04-21 DIAGNOSIS — I824Y3 Acute embolism and thrombosis of unspecified deep veins of proximal lower extremity, bilateral: Secondary | ICD-10-CM | POA: Diagnosis not present

## 2023-04-21 MED ORDER — BACITRACIN-NEOMYCIN-POLYMYXIN OINTMENT TUBE
1.0000 | TOPICAL_OINTMENT | Freq: Every day | CUTANEOUS | 0 refills | Status: DC
  Filled 2023-04-21: qty 28.4, 28d supply, fill #0

## 2023-04-21 MED ORDER — METHOCARBAMOL 750 MG PO TABS
750.0000 mg | ORAL_TABLET | Freq: Three times a day (TID) | ORAL | 0 refills | Status: AC
  Filled 2023-04-21: qty 90, 30d supply, fill #0

## 2023-04-21 MED ORDER — AMOXICILLIN-POT CLAVULANATE 875-125 MG PO TABS
1.0000 | ORAL_TABLET | Freq: Two times a day (BID) | ORAL | 0 refills | Status: AC
  Filled 2023-04-21: qty 14, 7d supply, fill #0

## 2023-04-21 MED ORDER — METHYLPREDNISOLONE 4 MG PO TBPK
ORAL_TABLET | ORAL | 0 refills | Status: DC
  Filled 2023-04-21: qty 21, 6d supply, fill #0

## 2023-04-21 MED ORDER — HYDROCODONE-ACETAMINOPHEN 5-325 MG PO TABS
1.0000 | ORAL_TABLET | Freq: Two times a day (BID) | ORAL | 0 refills | Status: DC | PRN
  Filled 2023-04-21: qty 10, 5d supply, fill #0

## 2023-04-21 MED ORDER — APIXABAN 5 MG PO TABS
ORAL_TABLET | ORAL | 0 refills | Status: DC
  Filled 2023-04-21: qty 192, 90d supply, fill #0

## 2023-04-21 MED ORDER — GABAPENTIN 300 MG PO CAPS
300.0000 mg | ORAL_CAPSULE | Freq: Three times a day (TID) | ORAL | 1 refills | Status: DC
  Filled 2023-04-21: qty 90, 30d supply, fill #0
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
## 2023-04-21 NOTE — Discharge Instructions (Addendum)
Watch for signs of infection - Redness at wound site, Increased Pain in the area, Fever >100.4 Rent/Utility/Housing  Agency Name: [facility] Address: [address] [place], [place] [address] Phone: [phone] Email: [email or web address] Website: [email or web address] Service(s) Offered: Housing services, self-sufficiency, congregate meal program, weatherization program, Field seismologist program, emergency food assistance,  housing counseling, home ownership program, wheels -towork program.  Agency Name: [facility] Address: [address], [place], [place] [address] Phone: [phone] (8a-4p) [phone] (8p- 10p) Email: [email or web address] Website: [email or web address] Service(s) Offered: A program for homeless and/or needy men that includes one-on-one counseling, life skills training and job rehabilitation.  Agency Name: [employer] [facility] [place] Address: [address], [place], [place] [address] Phone: [phone] Website: www.alliedchurches.org Service(s) Offered: Assistance to needy in emergency with utility bills, heating fuel, and prescriptions. Shelter for homeless 7pm-7am. July 17, 2016 15  Agency Name: [facility] [place] (Developmentally Disabled) Address: [address], [place], [place] [address] Phone: [phone] Contact Person: Cathleen Corti Email: [email or web address] Website: [email or web address] Service(s) Offered: Helps individuals with developmental disabilities move from housing that is more restrictive to homes where they  can achieve greater independence and have more  opportunities.  Agency Name: [facility] Address: [address], [place], [place] [address] Phone: [phone] Email: [email or web address] Website: www.[email or web address].org Service(s) Offered: Provides affordable housing for low-income families, elderly, and  disabled individuals. Offer a wide range of  programs and services, from financial planning to afterschool and summer programs.  Agency Name: [facility] of Social Services Address: [address] [place], [place] [address] Phone: [phone] Service(s) Offered: Child support services; child welfare services; food stamps; Medicaid; work first family assistance; and aid with fuel,  rent, food and medicine.  Agency Name: [facility] [place] [employer]. Address: [facility] [address] [place], [place]  [address] Phone: [phone] Website: www.familyabuseservices.org Service(s) Offered: 24 hour Crisis Line: [phone]; 24 hour Emergency Shelter; Transitional Housing; Support Groups; Scientist, physiological; [employer]; Hispanic Outreach: [phone];  Visitation Center: [phone].  Agency Name: [facility] [place]. Address: [address], [place], [place] [address] Phone: [phone] Service(s) Offered: CAP Services; Home and [employer]; Individual or Group Supports; Respite Care Non-Institutional Nursing;  Residential Supports; Respite Care and Personal Care Services; Transportation; Family and Friends Night; Recreational Activities; Three Nutritious Meals/Snacks; Consultation with Registered Dietician; Twenty-four hour Registered Nurse Access; Daily and [employer]; Camp Green Leaves; [place] for the [employer] (During Summer Months) Bingo Night (Every  Wednesday Night); Special Populations Dance Night  (Every Tuesday Night); Professional Hair Care Services.  Agency Name: [facility] Address: [address], [place], [place] [address] Phone: [phone] Contact Person: Jabier Mutton Website: [email or web address].Physicist, medical) Offered: Residential treatment facility for women; food and  clothing, educational & employment development and  transportation to work; Counsellor of  financial skills;  parenting and family reunification; emotional and spiritual  support; transitional housing for program graduates.  Agency Name: [facility] Address: [address] [place], [place] [address] Phone: [phone] Email: [email or web address] Website: TaskTown.es Service(s) Offered: Public housing units for elderly, disabled, and low income people; housing choice vouchers for income eligible  applicants; shelter plus care vouchers; and Psychologist, clinical.  Agency Name: [facility] [place] Address: [address] [place], [place] [address] Phone: [phone] Email: [email or web address] Website: www.habitatalamance.org Service(s) Offered: Build houses for families in need of decent housing. Each adult in the family must invest 200 hours  of labor on  someone else's house, work with volunteers to build their own house, attend classes on budgeting, home maintenance, yard care, and attend homeowner association meetings.  Agency Name: [facility] Address: [address] [place], [place] [address] Phone: [phone] Website: www.rsli.org Service(s) Offered: Intermediate care facilities for intellectually delayed, Supervised Living in group homes for adults with developmental disabilities, Supervised Living for people who have dual diagnoses (MRMI), Independent Living, Supported Living, respite and a variety of CAP services, pre-vocational services, day supports, and [employer].  Agency Name: [place] [facility] Phone: [phone] Website: www.NCForeclosurePrevention.gov Service(s) Offered: Zero-interest, deferred loans to homeowners struggling to pay their mortgage. Call for more information.  Shelters Resource List  [employer] RESCUE MISSION PROVIDED BY: [facility] [address] [place], [place], [place] Offers a faith-based shelter for homeless men, usually with  substance use disorders. Residents receive counseling, life skills training, and help finding a job.  HOMELESS SHELTER PROVIDED BY: ALLIED CHURCHES OF [facility] [address] [place], [place], [place] Offers a shelter for men, women, and families experiencing homelessness. Food, clothing and other items are available for residents. Also offers support and services to help residents become self-sufficient. Offers temporary emergency housing for 30 days. Additional shelter may be available when temperatures drop below freezing but is not guaranteed.  FAMILY ABUSE SERVICES OF [facility] COUNTY PROVIDED BY: FAMILY ABUSE SERVICES OF [facility] [address], [place], [place] Offers services for victims of domestic violence. Offers a 24-hour crisis line and emergency shelter. Offers information and referrals to other community resources. Also offers court advocacy and support groups.  HOUSING CHOICE VOUCHER PROGRAM PROVIDED BY: [facility] [place] [address], [place], [place] Offers vouchers for approved Section 8 properties. Vouchers offer financial help with rent    FRUIT TREE MINISTRIES PROVIDED BY: [facility], [place], [place] Offers emergency shelter for victims of domestic violence. Also offers a 24-hour crisis hotline for victims of domestic violence, safety planning, information and referrals, case management, and support groups for victims of domestic violence.   ACT TOGETHER EMERGENCY SHELTER PROVIDED BY: [facility] [address], [place], [place] Offers a 21-day emergency shelter for youth experiencing a family crisis, abuse, or homelessness. Case management, supportive services, healthcare services, and more are available for residents. SHELTER PROVIDED BY: [facility] [address], [place], [place] Offers a homeless shelter for people and families. Meals, showers, community referrals, case management, and more are  available for residents. HEARTH TRANSITIONAL LIVING PROGRAM PROVIDED BY: [facility] [address], [place], [place] Offers an 61-month homeless shelter for younger adults experiencing homelessness. Case management, independent living skills education, and more are available for residents. PARTNERSHIP VILLAGE PROVIDED BY: [facility] [address], [place], [place] Offers transitional housing for families and single people experiencing homelessness. Residents meet regularly with a case manager to work towards self-sufficiency TRANSITIONAL HOUSING PROVIDED BY: [facility] [address], [place], [place] Offers transitional housing for female veterans with disabilities. Residents receive meals, transportation, and clothing. Also offers support groups, nutrition classes, and peer support to residents.   WEAVER HOUSE PROVIDED BY: [place] [facility] [address] [place] [address], [place], [place] Offers shelter to adult men and women. Guests receive hot meals and case management. Also offers overnight shelter when temperatures drop during cold winter months  EMERGENCY FAMILY SHELTER PROVIDED BY: [facility] [address], ,  Offers shelter and support services for families experiencing homelessness.  Transportation  Resources  Agency Name: [facility] Agency Address: [address] [place], [place] [address] Phone: [phone] Email: [email or web address] Website: www.alamanceservices.org Service(s) Offered: Housing services, self-sufficiency, congregate meal program, weatherization program, Field seismologist program, emergency food assistance,  housing counseling, home ownership program, wheels-towork program.  Agency Name: [facility] [employer] [phone]) Address: [address], [place], [place] [address] Phone: [phone] Website: [email or web address].[email or web address] Service(s) Offered:  Transportation for [employer], subscription and demand response; Dial-a-Ride for citizens 67 years of age or older.  Agency Name: Department of Social Services Address: [address] [place], [place] [address] Phone: [phone] Service(s) Offered: Child support services; child welfare services; food stamps; Medicaid; work first family assistance; and aid with fuel,  rent, food and medicine, transportation assistance.  Agency Name: Disabled [employer] (DAV) Transportation  Network Phone: [phone] Service(s) Offered: Transports veterans to the [facility] medical center. Call  forty-eight hours in advance and leave the name, telephone  number, date, and time of appointment. Veteran will be  contacted by the driver the day before the appointment to  arrange a pick up point   Transportation Resources  Agency Name: [facility] Agency Address: [address] [place], [place] [address] Phone: [phone] Email: [email or web address] Website: www.[email or web address].org Service(s) Offered: Housing services, self-sufficiency, congregate meal program, weatherization program, Field seismologist program, emergency food assistance,  housing counseling, home ownership program, wheels-towork program.  Agency Name: [facility] [employer] [phone]) Address: [address], [place], [place] [address] Phone: [phone] Website: [email or web address].acta-[email or web address] Service(s) Offered: Transportation for [employer], subscription and demand response; Dial-a-Ride for citizens 35 years of age or older.  Agency Name: Department of Social Services Address: [address] [place], [place] [address] Phone: [phone] Service(s) Offered: Child support services; child welfare services; food stamps; Medicaid; work first family assistance; and aid with fuel,  rent, food and medicine, transportation assistance.  Agency Name: Disabled  [employer] (DAV) Transportation  Network Phone: [phone] Service(s) Offered: Transports veterans to the [facility] medical center. Call  forty-eight hours in advance and leave the name, telephone  number, date, and time of appointment. Veteran will be  contacted by the driver the day before the appointment to  arrange a pick up point    [employer] ACTA currently provides door to door services. ACTA connects with PART daily for services to [facility]. ACTA also performs contract services to [employer] operates 27 vehicles, all but 3 mini-vans are equipped with lifts for special needs as well as the general public. ACTA drivers are each CDL certified and trained in First Aid and CPR. ACTA was established in 2002 by [employer]. An independent Industrial/product designer. ACTA operates via Cytogeneticist with required Research scientist (physical sciences) from [place]. ACTA provides over 80,000 passenger trips each year, including [facility] and [employer] sites.  Call at least by 11 AM one business day prior to needing transportation  [employer].                      [place], [place] [address]     Office Hours: Monday-Friday  8 AM - 5 PM

## 2023-04-29 NOTE — Congregational Nurse Program (Signed)
  Dept: [phone]   Congregational Nurse Program Note  Date of Encounter: 04/29/2023 Client to [facility] day center with inquiry about her medicaid coverage. RN did verify that she has [facility] managed care plan. She was interested insetting up a PCP and wanted to be seen at [facility] on [address][phone]) . RN contacted this provider office and client was stiull active in their system, however the march schedule is not available until tomorrow 2/6. Client plans to contact the office tomorrow, phone number given, she wishes to make her own appointment. She reports she has a 90 day supply of her medication given on her hospital discharge on 1/28. No other needs at this time. 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 Yes    Student Assistance N/A    Location Patient Served  [facility]    Encounter Setting CN site    Population Status Unhoused   client reports she is staying where ever   Insurance Medicaid   wellcare   Insurance/Financial Assistance Referral N/A    Medication N/A   Medication obtained at [facility] Provider No   contacted [facility]   Screening Referrals Made N/A    Medical Referrals Made [facility] PCP/Clinic   [facility]   Medical Appointment Completed N/A    CNP Interventions Advocate/Support;Navigate Healthcare System;Educate;Case Management    Screenings CN Performed N/A    ED Visit Averted N/A    Life-Saving Intervention Made N/A

## 2023-09-29 NOTE — Congregational Nurse Program (Signed)
  Dept: [phone]   Congregational Nurse Program Note  Date of Encounter: 09/29/2023 Client to [facility] day center , nurse led clinic with complaints of left ankle pain. She reports and old injury and that she had twisted it and now it hurts. She requested it to be wrapped. Ankle wrapped with stretch bandage. Education provided regarding using ice and elevation, which she was agreeable to. Also to use an anti inflammatory. She voiced understanding. She is also in need of a PCP and dental appointment. Client was given the numbers at her request for [facility] and [facility], she plans to call to make her own appointments. RN offered assistance with making the appointments, client declined at this time. RN also educated client on the use of her Medicaid transportation to and from medical appointments. 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 Yes    Student Assistance N/A    Location Patient Served  [facility]    Encounter Setting CN site    Population Status Unhoused   client reports she is staying where ever   Insurance Medicaid   wellcare   Insurance/Financial Assistance Referral N/A    Medication N/A   Medication obtained at [facility] Provider No   contacted [facility]   Screening Referrals Made Annual Wellness Visit    Medical Referrals Made [facility]   [facility]   Medical Appointment Completed N/A    CNP Interventions Advocate/Support;Navigate Healthcare System;Educate    Screenings CN Performed N/A    ED Visit Averted N/A     Life-Saving Intervention Made N/A

## 2023-11-26 ENCOUNTER — Emergency Department: Payer: MEDICAID

## 2023-11-26 ENCOUNTER — Other Ambulatory Visit: Payer: Self-pay

## 2023-11-26 ENCOUNTER — Inpatient Hospital Stay
Admission: EM | Admit: 2023-11-26 | Discharge: 2023-11-30 | DRG: 300 | Disposition: A | Discharge status: Home / Self Care | Payer: MEDICAID | Attending: Internal Medicine | Admitting: Internal Medicine

## 2023-11-26 DIAGNOSIS — F17210 Nicotine dependence, cigarettes, uncomplicated: Secondary | ICD-10-CM | POA: Diagnosis present

## 2023-11-26 DIAGNOSIS — D6851 Activated protein C resistance: Secondary | ICD-10-CM | POA: Diagnosis present

## 2023-11-26 DIAGNOSIS — Z5901 Sheltered homelessness: Secondary | ICD-10-CM

## 2023-11-26 DIAGNOSIS — I82412 Acute embolism and thrombosis of left femoral vein: Secondary | ICD-10-CM | POA: Diagnosis present

## 2023-11-26 DIAGNOSIS — Z79899 Other long term (current) drug therapy: Secondary | ICD-10-CM

## 2023-11-26 DIAGNOSIS — Y929 Unspecified place or not applicable: Secondary | ICD-10-CM

## 2023-11-26 DIAGNOSIS — I82512 Chronic embolism and thrombosis of left femoral vein: Secondary | ICD-10-CM | POA: Diagnosis present

## 2023-11-26 DIAGNOSIS — Z832 Family history of diseases of the blood and blood-forming organs and certain disorders involving the immune mechanism: Secondary | ICD-10-CM

## 2023-11-26 DIAGNOSIS — D6862 Lupus anticoagulant syndrome: Secondary | ICD-10-CM | POA: Diagnosis present

## 2023-11-26 DIAGNOSIS — Z888 Allergy status to other drugs, medicaments and biological substances status: Secondary | ICD-10-CM

## 2023-11-26 DIAGNOSIS — Z91148 Patient's other noncompliance with medication regimen for other reason: Secondary | ICD-10-CM

## 2023-11-26 DIAGNOSIS — G4700 Insomnia, unspecified: Secondary | ICD-10-CM | POA: Diagnosis present

## 2023-11-26 DIAGNOSIS — F32A Depression, unspecified: Secondary | ICD-10-CM | POA: Diagnosis present

## 2023-11-26 DIAGNOSIS — G8929 Other chronic pain: Secondary | ICD-10-CM | POA: Diagnosis present

## 2023-11-26 DIAGNOSIS — I825Y1 Chronic embolism and thrombosis of unspecified deep veins of right proximal lower extremity: Secondary | ICD-10-CM | POA: Diagnosis present

## 2023-11-26 DIAGNOSIS — I82532 Chronic embolism and thrombosis of left popliteal vein: Secondary | ICD-10-CM | POA: Diagnosis present

## 2023-11-26 DIAGNOSIS — R918 Other nonspecific abnormal finding of lung field: Secondary | ICD-10-CM | POA: Diagnosis present

## 2023-11-26 DIAGNOSIS — J45909 Unspecified asthma, uncomplicated: Secondary | ICD-10-CM | POA: Diagnosis present

## 2023-11-26 DIAGNOSIS — I471 Supraventricular tachycardia, unspecified: Secondary | ICD-10-CM | POA: Diagnosis present

## 2023-11-26 DIAGNOSIS — Z881 Allergy status to other antibiotic agents status: Secondary | ICD-10-CM

## 2023-11-26 DIAGNOSIS — R0789 Other chest pain: Principal | ICD-10-CM

## 2023-11-26 DIAGNOSIS — T45516A Underdosing of anticoagulants, initial encounter: Secondary | ICD-10-CM | POA: Diagnosis present

## 2023-11-26 DIAGNOSIS — I82611 Acute embolism and thrombosis of superficial veins of right upper extremity: Principal | ICD-10-CM | POA: Diagnosis present

## 2023-11-26 LAB — BASIC METABOLIC PANEL WITH GFR
Anion gap: 12 (ref 5–15)
BUN: 15 mg/dL (ref 6–20)
CO2: 20 mmol/L — ABNORMAL LOW (ref 22–32)
Calcium: 9 mg/dL (ref 8.9–10.3)
Chloride: 104 mmol/L (ref 98–111)
Creatinine, Ser: 1.07 mg/dL — ABNORMAL HIGH (ref 0.44–1.00)
GFR, Estimated: >60 mL/min (ref >60)
Glucose, Bld: 99 mg/dL (ref 70–99)
Potassium: 3.5 mmol/L (ref 3.5–5.1)
Sodium: 136 mmol/L (ref 135–145)

## 2023-11-26 LAB — CBC
HCT: 39.2 % (ref 36.0–46.0)
Hemoglobin: 13.6 g/dL (ref 12.0–15.0)
MCH: 31.1 pg (ref 26.0–34.0)
MCHC: 34.7 g/dL (ref 30.0–36.0)
MCV: 89.7 fL (ref 80.0–100.0)
Platelets: 273 K/uL (ref 150–400)
RBC: 4.37 MIL/uL (ref 3.87–5.11)
RDW: 13.1 % (ref 11.5–15.5)
WBC: 11.2 K/uL — ABNORMAL HIGH (ref 4.0–10.5)
nRBC: 0 % (ref 0.0–0.2)

## 2023-11-26 LAB — APTT: aPTT: 26 s (ref 24–36)

## 2023-11-26 LAB — TROPONIN I (HIGH SENSITIVITY): Troponin I (High Sensitivity): 7 ng/L (ref <18)

## 2023-11-26 MED ORDER — IOHEXOL 350 MG/ML SOLN
100.0000 mL | Freq: Once | INTRAVENOUS | Status: AC | PRN
  Administered 2023-11-26: 75 mL via INTRAVENOUS

## 2023-11-26 MED ORDER — HYDROMORPHONE HCL 1 MG/ML IJ SOLN
1.0000 mg | Freq: Once | INTRAMUSCULAR | Status: AC
  Administered 2023-11-26: 1 mg via INTRAVENOUS
  Filled 2023-11-26: qty 1

## 2023-11-26 NOTE — ED Notes (Signed)
 US  at bedside.

## 2023-11-26 NOTE — ED Provider Notes (Signed)
 [facility] Provider Note    Event Date/Time   First MD Initiated Contact with Patient 11/26/23 2140     (approximate)   History   Leg Pain   HPI  Brianna Ashley is a 38 y.o. female who presents to the ED for evaluation of Leg Pain   I reviewed medical DC summary from January.  History of factor V Leiden, homelessness.  Acute DVT, nonadherence to medications.  Discharged with 3 months of Eliquis .  Perirectal abscess on Augmentin   Patient presents with concerns for DVT to all 4 extremities, shortness of breath and chest pain with concerns for PE.  Reports intermittent compliance with her Eliquis    Physical Exam   Triage Vital Signs: ED Triage Vitals  Encounter Vitals Group     BP 11/26/23 2122 117/72     Girls Systolic BP Percentile --      Girls Diastolic BP Percentile --      Boys Systolic BP Percentile --      Boys Diastolic BP Percentile --      Pulse Rate 11/26/23 2122 79     Resp 11/26/23 2122 (!) 24     Temp 11/26/23 2122 98.5 F (36.9 C)     Temp src --      SpO2 11/26/23 2122 98 %     Weight 11/26/23 2126 242 lb 8.1 oz (110 kg)     Height 11/26/23 2126 5' 5 (1.651 m)     Head Circumference --      Peak Flow --      Pain Score 11/26/23 2120 10     Pain Loc --      Pain Education --      Exclude from Growth Chart --     Most recent vital signs: Vitals:   11/26/23 2200 11/26/23 2230  BP: 122/63 137/77  Pulse: 75 62  Resp: (!) 22 15  Temp:    SpO2: 100% 100%    General: Awake, no distress.  CV:  Good peripheral perfusion.  Resp:  Normal effort.  Abd:  No distention.  MSK:  No deformity noted.  No evidence of ischemia Neuro:  No focal deficits appreciated. Other:     ED Results / Procedures / Treatments   Labs (all labs ordered are listed, but only abnormal results are displayed) Labs Reviewed  BASIC METABOLIC PANEL WITH GFR - Abnormal; Notable for the following components:      Result Value   CO2 20 (*)     Creatinine, Ser 1.07 (*)    All other components within normal limits  CBC - Abnormal; Notable for the following components:   WBC 11.2 (*)    All other components within normal limits  APTT  POC URINE PREG, ED  TROPONIN I (HIGH SENSITIVITY)  TROPONIN I (HIGH SENSITIVITY)    EKG Sinus rhythm with a rate of 74 bpm.  Normal axis and intervals.  No acute signs of acute ischemia.  RADIOLOGY CXR interpreted by me without evidence of acute cardiopulmonary pathology.  Official radiology report(s): DG Chest 1 View Result Date: 11/26/2023 CLINICAL DATA:  [id number].  Chest pain. [id number].  Shortness of breath. Bilateral lower extremity pain for 2 weeks.  Headaches. EXAM: CHEST  1 VIEW COMPARISON:  Chest PA Lat 02/03/2017, chest, abdomen and pelvis CT with IV contrast 03/31/2020. FINDINGS: The heart size and mediastinal contours are within normal limits. Both lungs are clear. The visualized skeletal structures are unremarkable. IMPRESSION: No active disease.  Stable chest. Electronically Signed   By: Francis Quam M.D.   On: 11/26/2023 21:48    PROCEDURES and INTERVENTIONS:  Procedures  Medications  HYDROmorphone  (DILAUDID ) injection 1 mg (1 mg Intravenous Given 11/26/23 2228)  iohexol  (OMNIPAQUE ) 350 MG/ML injection 100 mL (75 mLs Intravenous Contrast Given 11/26/23 2239)     IMPRESSION / MDM / ASSESSMENT AND PLAN / ED COURSE  I reviewed the triage vital signs and the nursing notes.  Differential diagnosis includes, but is not limited to, DVT, PE, anxiety, secondary gain  {Patient presents with symptoms of an acute illness or injury that is potentially life-threatening.  Patient presents with chest pain and shortness of breath concerning for recurrence of PE in the setting of active 5 Leiden medication noncompliance.  Nonischemic EKG and negative troponin, essentially normal CBC and metabolic panel.  Reassuring x-ray.  Remainder of imaging still pending at the time of signout to oncoming  physician.      FINAL CLINICAL IMPRESSION(S) / ED DIAGNOSES   Final diagnoses:  Other chest pain     Rx / DC Orders   ED Discharge Orders     None        Note:  This document was prepared using Dragon voice recognition software and may include unintentional dictation errors.   Claudene Rover, MD 11/26/23 [phone]

## 2023-11-26 NOTE — ED Triage Notes (Signed)
 Bilateral leg pain x 2 weeks. Right arm was hurting when she woke up, area of redness on forearm. She says yesterday she lost her right eye vision top half for 35-45 minutes. Headache last night.  Pt with hx of DVT's and is on Eliquis . Onset of SOB, left sided chest pain for 3-4 days, intermittent.

## 2023-11-27 ENCOUNTER — Emergency Department: Payer: MEDICAID

## 2023-11-27 ENCOUNTER — Encounter: Payer: Self-pay | Admitting: Family Medicine

## 2023-11-27 DIAGNOSIS — G4700 Insomnia, unspecified: Secondary | ICD-10-CM | POA: Diagnosis present

## 2023-11-27 DIAGNOSIS — T45516A Underdosing of anticoagulants, initial encounter: Secondary | ICD-10-CM | POA: Diagnosis present

## 2023-11-27 DIAGNOSIS — Z888 Allergy status to other drugs, medicaments and biological substances status: Secondary | ICD-10-CM | POA: Diagnosis not present

## 2023-11-27 DIAGNOSIS — I82611 Acute embolism and thrombosis of superficial veins of right upper extremity: Secondary | ICD-10-CM | POA: Diagnosis not present

## 2023-11-27 DIAGNOSIS — Z5901 Sheltered homelessness: Secondary | ICD-10-CM | POA: Diagnosis not present

## 2023-11-27 DIAGNOSIS — Y929 Unspecified place or not applicable: Secondary | ICD-10-CM | POA: Diagnosis not present

## 2023-11-27 DIAGNOSIS — I82512 Chronic embolism and thrombosis of left femoral vein: Secondary | ICD-10-CM

## 2023-11-27 DIAGNOSIS — Z91148 Patient's other noncompliance with medication regimen for other reason: Secondary | ICD-10-CM | POA: Diagnosis not present

## 2023-11-27 DIAGNOSIS — R0789 Other chest pain: Secondary | ICD-10-CM | POA: Diagnosis present

## 2023-11-27 DIAGNOSIS — Z79899 Other long term (current) drug therapy: Secondary | ICD-10-CM | POA: Diagnosis not present

## 2023-11-27 DIAGNOSIS — D6851 Activated protein C resistance: Secondary | ICD-10-CM | POA: Diagnosis present

## 2023-11-27 DIAGNOSIS — G8929 Other chronic pain: Secondary | ICD-10-CM | POA: Diagnosis present

## 2023-11-27 DIAGNOSIS — I82532 Chronic embolism and thrombosis of left popliteal vein: Secondary | ICD-10-CM | POA: Diagnosis present

## 2023-11-27 DIAGNOSIS — F17210 Nicotine dependence, cigarettes, uncomplicated: Secondary | ICD-10-CM | POA: Diagnosis present

## 2023-11-27 DIAGNOSIS — Z881 Allergy status to other antibiotic agents status: Secondary | ICD-10-CM | POA: Diagnosis not present

## 2023-11-27 DIAGNOSIS — Z832 Family history of diseases of the blood and blood-forming organs and certain disorders involving the immune mechanism: Secondary | ICD-10-CM | POA: Diagnosis not present

## 2023-11-27 DIAGNOSIS — I825Y1 Chronic embolism and thrombosis of unspecified deep veins of right proximal lower extremity: Secondary | ICD-10-CM | POA: Diagnosis present

## 2023-11-27 DIAGNOSIS — I82412 Acute embolism and thrombosis of left femoral vein: Secondary | ICD-10-CM | POA: Diagnosis present

## 2023-11-27 DIAGNOSIS — I82811 Embolism and thrombosis of superficial veins of right lower extremities: Secondary | ICD-10-CM | POA: Diagnosis not present

## 2023-11-27 DIAGNOSIS — R918 Other nonspecific abnormal finding of lung field: Secondary | ICD-10-CM | POA: Diagnosis present

## 2023-11-27 DIAGNOSIS — D6862 Lupus anticoagulant syndrome: Secondary | ICD-10-CM | POA: Diagnosis present

## 2023-11-27 DIAGNOSIS — I471 Supraventricular tachycardia, unspecified: Secondary | ICD-10-CM | POA: Diagnosis present

## 2023-11-27 DIAGNOSIS — F32A Depression, unspecified: Secondary | ICD-10-CM | POA: Diagnosis present

## 2023-11-27 DIAGNOSIS — J45909 Unspecified asthma, uncomplicated: Secondary | ICD-10-CM | POA: Diagnosis present

## 2023-11-27 LAB — BASIC METABOLIC PANEL WITH GFR
Anion gap: 11 (ref 5–15)
BUN: 14 mg/dL (ref 6–20)
CO2: 22 mmol/L (ref 22–32)
Calcium: 8.5 mg/dL — ABNORMAL LOW (ref 8.9–10.3)
Chloride: 104 mmol/L (ref 98–111)
Creatinine, Ser: 0.85 mg/dL (ref 0.44–1.00)
GFR, Estimated: >60 mL/min (ref >60)
Glucose, Bld: 133 mg/dL — ABNORMAL HIGH (ref 70–99)
Potassium: 3.7 mmol/L (ref 3.5–5.1)
Sodium: 137 mmol/L (ref 135–145)

## 2023-11-27 LAB — TROPONIN I (HIGH SENSITIVITY): Troponin I (High Sensitivity): 8 ng/L (ref <18)

## 2023-11-27 LAB — CBC
HCT: 35.1 % — ABNORMAL LOW (ref 36.0–46.0)
Hemoglobin: 12.4 g/dL (ref 12.0–15.0)
MCH: 31.7 pg (ref 26.0–34.0)
MCHC: 35.3 g/dL (ref 30.0–36.0)
MCV: 89.8 fL (ref 80.0–100.0)
Platelets: 239 K/uL (ref 150–400)
RBC: 3.91 MIL/uL (ref 3.87–5.11)
RDW: 13.3 % (ref 11.5–15.5)
WBC: 8.3 K/uL (ref 4.0–10.5)
nRBC: 0 % (ref 0.0–0.2)

## 2023-11-27 LAB — APTT
aPTT: 51 s — ABNORMAL HIGH (ref 24–36)
aPTT: 83 s — ABNORMAL HIGH (ref 24–36)

## 2023-11-27 LAB — POC URINE PREG, ED: Preg Test, Ur: NEGATIVE

## 2023-11-27 LAB — HIV ANTIBODY (ROUTINE TESTING W REFLEX): HIV Screen 4th Generation wRfx: NONREACTIVE

## 2023-11-27 LAB — HEPARIN LEVEL (UNFRACTIONATED): Heparin Unfractionated: >1.1 [IU]/mL — ABNORMAL HIGH (ref 0.30–0.70)

## 2023-11-27 LAB — PROTIME-INR
INR: 1.1 (ref 0.8–1.2)
Prothrombin Time: 15.1 s (ref 11.4–15.2)

## 2023-11-27 MED ORDER — MORPHINE SULFATE (PF) 2 MG/ML IV SOLN
2.0000 mg | INTRAVENOUS | Status: DC | PRN
  Administered 2023-11-27 – 2023-11-30 (×19): 2 mg via INTRAVENOUS
  Filled 2023-11-27 (×19): qty 1

## 2023-11-27 MED ORDER — ONDANSETRON HCL 4 MG PO TABS: 4.0000 mg | ORAL_TABLET | Freq: Four times a day (QID) | ORAL | Status: DC | PRN

## 2023-11-27 MED ORDER — HEPARIN BOLUS VIA INFUSION
2500.0000 [IU] | Freq: Once | INTRAVENOUS | Status: AC
  Administered 2023-11-27: 2500 [IU] via INTRAVENOUS
  Filled 2023-11-27: qty 2500

## 2023-11-27 MED ORDER — OXYCODONE-ACETAMINOPHEN 7.5-325 MG PO TABS
1.0000 | ORAL_TABLET | ORAL | Status: DC | PRN
  Administered 2023-11-28 – 2023-11-30 (×5): 1 via ORAL
  Filled 2023-11-27 (×5): qty 1

## 2023-11-27 MED ORDER — GABAPENTIN 300 MG PO CAPS
300.0000 mg | ORAL_CAPSULE | Freq: Three times a day (TID) | ORAL | Status: DC
  Administered 2023-11-27 – 2023-11-30 (×10): 300 mg via ORAL
  Filled 2023-11-27 (×10): qty 1

## 2023-11-27 MED ORDER — HEPARIN (PORCINE) 25000 UT/250ML-% IV SOLN
1750.0000 [IU]/h | INTRAVENOUS | Status: AC
  Administered 2023-11-27: 1450 [IU]/h via INTRAVENOUS
  Administered 2023-11-27 – 2023-11-28 (×2): 1750 [IU]/h via INTRAVENOUS
  Filled 2023-11-27 (×3): qty 250

## 2023-11-27 MED ORDER — TRAZODONE HCL 50 MG PO TABS: 25.0000 mg | ORAL_TABLET | Freq: Every evening | ORAL | Status: DC | PRN

## 2023-11-27 MED ORDER — SODIUM CHLORIDE 0.9 % IV SOLN: INTRAVENOUS | Status: AC

## 2023-11-27 MED ORDER — MAGNESIUM HYDROXIDE 400 MG/5ML PO SUSP: 30.0000 mL | Freq: Every day | ORAL | Status: DC | PRN

## 2023-11-27 MED ORDER — ONDANSETRON HCL 4 MG/2ML IJ SOLN: 4.0000 mg | Freq: Four times a day (QID) | INTRAMUSCULAR | Status: DC | PRN

## 2023-11-27 MED ORDER — MORPHINE SULFATE (PF) 2 MG/ML IV SOLN
2.0000 mg | INTRAVENOUS | Status: DC | PRN
  Administered 2023-11-27: 2 mg via INTRAVENOUS
  Filled 2023-11-27: qty 1

## 2023-11-27 MED ORDER — ACETAMINOPHEN 650 MG RE SUPP: 650.0000 mg | Freq: Four times a day (QID) | RECTAL | Status: DC | PRN

## 2023-11-27 MED ORDER — ACETAMINOPHEN 325 MG PO TABS: 650.0000 mg | ORAL_TABLET | Freq: Four times a day (QID) | ORAL | Status: DC | PRN

## 2023-11-27 MED ORDER — METHOCARBAMOL 500 MG PO TABS
750.0000 mg | ORAL_TABLET | Freq: Three times a day (TID) | ORAL | Status: DC
  Administered 2023-11-27 – 2023-11-30 (×10): 750 mg via ORAL
  Filled 2023-11-27 (×10): qty 2

## 2023-11-27 MED ORDER — HYDROMORPHONE HCL 1 MG/ML IJ SOLN
1.0000 mg | Freq: Once | INTRAMUSCULAR | Status: AC
  Administered 2023-11-27: 1 mg via INTRAVENOUS
  Filled 2023-11-27: qty 1

## 2023-11-27 NOTE — Progress Notes (Signed)
 No available telemetry boxes available at this time.  Alto, NP, made aware.  Alto to speak to attending at change of shift to determine if order is needed.

## 2023-11-27 NOTE — Progress Notes (Signed)
 ANTICOAGULATION CONSULT NOTE  Pharmacy Consult for heparin  infusion Indication: VTE Tx  Allergies  Allergen Reactions   Ambien [Zolpidem Tartrate] Other (See Comments)    death   Ambien [Zolpidem Tartrate]    Clindamycin /Lincomycin Nausea And Vomiting   Keflex  [Cephalexin ] Nausea And Vomiting   Methylphenidate Other (See Comments)    I pretty much died in my dad's arms   Nicotine  Nausea And Vomiting   Ritalin [Methylphenidate Hcl] Other (See Comments)    death   Ritalin [Methylphenidate Hcl]    Zolpidem Other (See Comments)    I pretty much died a few times    Patient Measurements: Height: 5' 5 (165.1 cm) Weight: 110 kg (242 lb 8.1 oz) IBW/kg (Calculated) : 57 HEPARIN  DW (KG): 82.9  Vital Signs: Temp: 98.2 F (36.8 C) (09/05 0134) Temp Source: Oral (09/05 0134) BP: 129/62 (09/05 0134) Pulse Rate: 71 (09/05 0134)  Labs: Recent Labs    11/26/23 2125 11/26/23 2226 11/27/23 0116  HGB 13.6  --   --   HCT 39.2  --   --   PLT 273  --   --   APTT  --  26  --   CREATININE 1.07*  --   --   TROPONINIHS 7  --  8    Estimated Creatinine Clearance: 88.9 mL/min (A) (by C-G formula based on SCr of 1.07 mg/dL (H)).   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 Right arm was hurting when she woke up, area of redness on forearm.  Pt found with stable nonocclusive thrombus within the left femoral vein, profundus femoral vein, and popliteal vein, stable occlusive thrombus within the right lesser saphenous vein, and occlusive thrombus within the right cephalic vein within the  forearm.  Goal of Therapy:  Heparin  level 0.3-0.7 units/ml aPTT 66-102 seconds Monitor platelets by anticoagulation protocol: Yes   Plan:  No initial bolus. Start heparin  infusion at 1450 units/hr Will follow aPTT until correlation w/ HL confirmed Will check aPTT in 6 hr after start of infusion HL & CBC daily while on heparin   Rankin CANDIE Dills, PharmD, [facility] 11/27/2023 1:54 AM

## 2023-11-27 NOTE — Progress Notes (Signed)
 ANTICOAGULATION CONSULT NOTE  Pharmacy Consult for heparin  infusion Indication: VTE Tx  Allergies  Allergen Reactions   Ambien [Zolpidem Tartrate] Other (See Comments)    death   Ambien [Zolpidem Tartrate]    Clindamycin /Lincomycin Nausea And Vomiting   Keflex  [Cephalexin ] Nausea And Vomiting   Methylphenidate Other (See Comments)    I pretty much died in my dad's arms   Nicotine  Nausea And Vomiting   Ritalin [Methylphenidate Hcl] Other (See Comments)    death   Ritalin [Methylphenidate Hcl]    Zolpidem Other (See Comments)    I pretty much died a few times    Patient Measurements: Height: 5' 5 (165.1 cm) Weight: 110 kg (242 lb 8.1 oz) IBW/kg (Calculated) : 57 HEPARIN  DW (KG): 82.9  Vital Signs: Temp: 98.4 F (36.9 C) (09/05 1833) Temp Source: Oral (09/05 1833) BP: 104/57 (09/05 1833) Pulse Rate: 60 (09/05 1833)  Labs: Recent Labs    11/26/23 2125 11/26/23 2226 11/27/23 0116 11/27/23 0201 11/27/23 0947 11/27/23 1910  HGB 13.6  --   --   --  12.4  --   HCT 39.2  --   --   --  35.1*  --   PLT 273  --   --   --  239  --   APTT  --  26  --   --  51* 83*  LABPROT  --   --   --  15.1  --   --   INR  --   --   --  1.1  --   --   HEPARINUNFRC  --   --   --  >1.10*  --   --   CREATININE 1.07*  --   --   --  0.85  --   TROPONINIHS 7  --  8  --   --   --     Estimated Creatinine Clearance: 111.9 mL/min (by C-G formula based on SCr of 0.85 mg/dL).   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 Right arm was hurting when she woke up, area of redness on forearm.  Pt found  with stable nonocclusive thrombus within the left femoral vein, profundus femoral vein, and popliteal vein, stable occlusive thrombus within the right lesser saphenous vein, and occlusive thrombus within the right cephalic vein within the forearm.  Goal of Therapy:  Heparin  level 0.3-0.7 units/ml aPTT 66-102 seconds Monitor platelets by anticoagulation protocol: Yes  9/5 0947: aPTT 51, HL > 1.1, SUBtherapeutic  9/5 1910 aPTT 83, therapeutic x1   Plan:  Continue heparin  infusion at 1750 units/hr Will follow aPTT until correlation w/ HL confirmed Will recheck aPTT in 6 hr  HL & CBC daily while on heparin   Thank you for involving pharmacy in this patient's care.   Damien Napoleon, PharmD Clinical Pharmacist 11/27/2023 7:48 PM

## 2023-11-27 NOTE — Progress Notes (Signed)
 ANTICOAGULATION CONSULT NOTE  Pharmacy Consult for heparin  infusion Indication: VTE Tx  Allergies  Allergen Reactions   Ambien [Zolpidem Tartrate] Other (See Comments)    death   Ambien [Zolpidem Tartrate]    Clindamycin /Lincomycin Nausea And Vomiting   Keflex  [Cephalexin ] Nausea And Vomiting   Methylphenidate Other (See Comments)    I pretty much died in my dad's arms   Nicotine  Nausea And Vomiting   Ritalin [Methylphenidate Hcl] Other (See Comments)    death   Ritalin [Methylphenidate Hcl]    Zolpidem Other (See Comments)    I pretty much died a few times    Patient Measurements: Height: 5' 5 (165.1 cm) Weight: 110 kg (242 lb 8.1 oz) IBW/kg (Calculated) : 57 HEPARIN  DW (KG): 82.9  Vital Signs: Temp: 98 F (36.7 C) (09/05 0803) Temp Source: Oral (09/05 0309) BP: 106/53 (09/05 0803) Pulse Rate: 64 (09/05 0803)  Labs: Recent Labs    11/26/23 2125 11/26/23 2226 11/27/23 0116 11/27/23 0201 11/27/23 0947  HGB 13.6  --   --   --  12.4  HCT 39.2  --   --   --  35.1*  PLT 273  --   --   --  239  APTT  --  26  --   --  51*  LABPROT  --   --   --  15.1  --   INR  --   --   --  1.1  --   HEPARINUNFRC  --   --   --  >1.10*  --   CREATININE 1.07*  --   --   --  0.85  TROPONINIHS 7  --  8  --   --     Estimated Creatinine Clearance: 111.9 mL/min (by C-G formula based on SCr of 0.85 mg/dL).   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 Right arm was hurting when she woke up, area of redness on forearm.  Pt found with stable nonocclusive thrombus within the left femoral  vein, profundus femoral vein, and popliteal vein, stable occlusive thrombus within the right lesser saphenous vein, and occlusive thrombus within the right cephalic vein within the forearm.  Goal of Therapy:  Heparin  level 0.3-0.7 units/ml aPTT 66-102 seconds Monitor platelets by anticoagulation protocol: Yes  9/5 0947: aPTT 51, HL > 1.1, SUBtherapeutic   - 9/5: Hgb and PLTs stable, will continue to monitor    Plan:  Will give a heparin  2,500 unit bolus x 1 Will increase heparin  infusion to 1750 units/hr Will follow aPTT until correlation w/ HL confirmed Will recheck aPTT in 6 hr  HL & CBC daily while on heparin    Ransom Blanch PGY-1 Pharmacy Resident  [facility]  11/27/2023 11:18 AM

## 2023-11-27 NOTE — Discharge Instructions (Signed)
 In order to establish care with a Primary Care Doctor  please call [facility] Member and Recipient Service Line at [phone].  Food Resources  Agency Name: [facility] Agency Address: [address], [place], [place] [address] Phone: [phone] Website: [email or web address] Service(s) Offered: Housing services, self-sufficiency, congregate meal program, weatherization program, Event organiser program, emergency food assistance,  housing counseling, home ownership program, wheels - to work program.  [employer] free for 60 and older at various locations from [employer], Monday-Friday:  [employer], [address] [place], [phone] -[facility], [address], [place] [phone]  -[facility], [address] [place] [phone]  -[address], [address] [place], [phone]  Agency Name: [facility] Address: [phone] [address], [place], [place] [address] Phone: [phone] Website: www.[email or web address] Service(s) Offered: Home delivered hot, frozen, and emergency  meals. Grocery assistance program which matches  volunteers one-on-one with seniors unable to grocery shop  for themselves. Must be 60 years and older; less than 20  hours of in-home aide service, limited or no driving ability;  live alone or with someone with a disability; live in  [place].  Agency Name: [facility]) Address: [address], [place], [place] [address] Phone: [phone] Service(s) Offered: Food is served to shut-ins, homeless, elderly, and low income people in the community every Saturday (11:30 am-12:30 pm) and Sunday (12:30 pm-1:30pm). Volunteers also offer help and encouragement in seeking employment,  and spiritual guidance.  Agency Name: Department of Social Services Address: [address] [place], [place] [address] Phone:  [phone] Service(s) Offered: Child support services; child welfare services; food stamps; Medicaid; work first family assistance; and aid with fuel,  rent, food and medicine.  Agency Name: Dietitian Address: [address], [place], [place] Phone: [phone] Website: [email or web address] Services Offered: Monday 10:00am-12:00, 8:00pm-9:00pm, and Friday 10:00am-12:00.  Agency Name: [facility] Address: [address], [place], [place] [address] Phone: [phone] Website: www.[email or web address].org Service(s) Offered: Serves weekday meals, open from 11:30 am- 1:00 pm., and 6:30-7:30pm, Monday-Wednesday-Friday distributes food 3:30-6pm, Monday-Wednesday-Friday.  Agency Name: [facility] Address: [address] [place], [place] Phone: [phone] Website: [email or web address] Services Offered: Distributes food the 4th Saturday of the month, starting at 8:00 am  Agency Name: [facility] Address: [phone] [address], [place], [place] [address] Phone: [phone] Website: [email or web address] Service(s) Offered: Bread of life, weekly food pantry. Open Wednesdays from 10:00am-noon.  Agency Name: [facility] [employer] Address: [address] [place], [place], [place] Phone: [phone] Services Offered: Distributes food 9am-1pm, Monday-Thursday. Call for details.  Agency Name: [facility] Address: [address], [place], [place] [address] Phone: [phone] Website: [email or web address].[email or web address] Service(s) Offered: Games developer. Call for assistance.  Agency Name: [facility] Address: [address] [place], [place] [address] Phone: [phone] Service Offered: Emergency Food Pantry. Call for appointment.  Agency Name: [facility] Address: [address], [place], [place] [address] Phone: [phone] Website:  [email or web address] Services Offered: Games developer. Call for details  Agency Name: [facility] Address: [address] [place], [place] Phone: [phone] Website: [email or web address] Service(s) Offered: Emergency Food Pantry. Call for details.  Agency Name: Holiday representative Address: [address], [place], [place] [address] Phone: [phone] or [phone] Website: [email or web address] Service(s) Offered: Distribute food 9am-11:30 am, Tuesday-Friday, and 1-3:30pm, Monday-Friday. Food pantry Monday-Friday 1pm-3pm, fresh items, Mon.-Wed.-Fri.  Agency Name: Conservation officer, nature [facility] (  [facility]) Address: [address] [place], [place] [address] Phone: [phone] Website: www.safealamance.org Services Offered: Distribute food Tues and Sats from 9:00am-noon. Closed 1st Saturday of each month. Call for details  Agency Name: [employer] Address: Fayrene Boatman [facility] [address] [place] [address] Phone: [phone]  Services Offered: Delivers meals every Thursday   Rent/Utility/Housing  Agency Name: [facility] Agency Address: [address] [place], [place] [address] Phone: [phone] Email: [email or web address] Website: [email or web address] Service(s) Offered: Housing services, self-sufficiency, congregate meal program, weatherization program, Field seismologist program, emergency food assistance,  housing counseling, home ownership program, wheels -towork program.  Agency Name: [facility] Address: [address] [place], [place] [address] Phone: [phone] (8a-4p) [phone] (8p- 10p) Email: [email or web address] Website: [email or web address] Service(s) Offered: A program for homeless and/or needy men that includes one-on-one counseling, life skills training and job rehabilitation.  Agency Name: [facility] Address:  [address], [place], [place] [address] Phone: [phone] Website: www.alliedchurches.org Service(s) Offered: Assistance to needy in emergency with utility bills, heating fuel, and prescriptions. Shelter for homeless 7pm-7am. July 17, 2016 15  Agency Name: [facility] [place] (Developmentally Disabled) Address: [address], [place], [place] [address] Phone: [phone] Contact Person: Lemond Cart Email: [email or web address] Website: [email or web address] Service(s) Offered: Helps individuals with developmental disabilities move from housing that is more restrictive to homes where they  can achieve greater independence and have more  opportunities.  Agency Name: [facility] Address: [address], [place], [place] [address] Phone: [phone] Email: [email or web address] Website: www.[email or web address].org Service(s) Offered: Provides affordable housing for low-income families, elderly, and disabled individuals. Offer a wide range of  programs and services, from financial planning to afterschool and summer programs.  Agency Name: Department of Social Services Address: [address] [place], [place] [address] Phone: [phone] Service(s) Offered: Child support services; child welfare services; food stamps; Medicaid; work first family assistance; and aid with fuel,  rent, food and medicine.  Agency Name: [facility] [place] [employer]. Address: [facility] [address] [place], [place]  [address] Phone: [phone] Website: www.familyabuseservices.org Service(s) Offered: 24 hour Crisis Line: [phone]; 24 hour Emergency Shelter; Transitional Housing; Support Groups; Scientist, physiological; [employer]; Hispanic Outreach: [phone];  Visitation Center: [phone].  Agency Name: [facility]. Address: [address] [place], [place] [address] Phone: [phone] Service(s) Offered: CAP Services;  Home and [employer]; Individual or Group Supports; Respite Care Non-Institutional Nursing;  Residential Supports; Respite Care and Personal Care Services; Transportation; Family and Friends Night; Recreational Activities; Three Nutritious Meals/Snacks; Consultation with Registered Dietician; Twenty-four hour Registered Nurse Access; Daily and [employer]; Camp Green Leaves; [place] for the [employer] (During Summer Months) Bingo Night (Every  Wednesday Night); Special Populations Dance Night  (Every Tuesday Night); Professional Hair Care Services.  Agency Name: [facility] Address: [address], [place], [place] [address] Phone: [phone] Contact Person: Meade High Website: [email or web address].Physicist, medical) Offered: Residential treatment facility for women; food and  clothing, educational & employment development and  transportation to work; Counsellor of financial skills;  parenting and family reunification; emotional and spiritual  support; transitional housing for program graduates.  Agency Name: [facility] Address: [address] [place], [place] [address] Phone: [phone] Email: [email or web address] Website: [email or web address].es Service(s) Offered: Public housing units for elderly, disabled, and low income people; housing choice vouchers for income eligible  applicants; shelter plus care vouchers; and [employer] program.  Agency Name: [facility] Address: [address], [place], [place] [address] Phone: [phone] Email: [email or web address] Website: www.habitatalamance.org Service(s) Offered: Build houses for families in need of decent housing. Each adult in the family must invest 200 hours of labor on  someone else's house, work with volunteers to build their own house, attend classes on budgeting, home maintenance, yard care, and attend homeowner  association meetings.  Agency Name: [facility] Address: [address], [place], [place] [address] Phone: [phone] Website: www.rsli.org Service(s) Offered: Intermediate care facilities for intellectually delayed, Supervised Living in group homes for adults with developmental disabilities, Supervised Living for people who have dual diagnoses (MRMI), Independent Living, Supported Living, respite and a variety of CAP services, pre-vocational services, day supports, and [employer].  Agency Name: [place] [employer] [facility] Phone: [phone] Website: www.NCForeclosurePrevention.gov Service(s) Offered: Zero-interest, deferred loans to homeowners struggling to pay their mortgage. Call for more information.    Transportation Resources for [employer]  Agency Name: [facility] Agency Address: [address] [place], [place] [address] Phone: [phone] Email: [email or web address] Website: www.[email or web address].org Service(s) Offered: Housing services, self-sufficiency, congregate meal program, weatherization program, Field seismologist program, emergency food assistance,  housing counseling, home ownership program, wheels-towork program.  Agency Name: [facility] [employer] [phone]) Address: [address] [place], [place] [address] Phone: [phone] Website: www.acta-[email or web address].[email or web address] Service(s) Offered: Transportation for [employer], subscription and demand response; Dial-a-Ride for citizens 35 years of age or older.  Agency Name: Department of Social Services Address: [address] [place], [place] [address] Phone: [phone] Service(s) Offered: Child support services; child welfare services; food stamps; Medicaid; work first family assistance; and aid with fuel,  rent, food and medicine, transportation assistance.  Agency Name: Disabled [employer] (DAV)  Transportation  Network Phone: [phone] Service(s) Offered: Transports veterans to the [facility] medical center. Call  forty-eight hours in advance and leave the name, telephone  number, date, and time of appointment. Veteran will be  contacted by the driver the day before the appointment to  arrange a pick up point    [employer] ACTA currently provides door to door services. ACTA connects with PART daily for services to [facility]. ACTA also performs contract services to [employer] operates 27 vehicles, all but 3 mini-vans are equipped with lifts for special needs as well as the general public. ACTA drivers are each CDL certified and trained in First Aid and CPR. ACTA was established in 2002 by [employer]. An independent Industrial/product designer. ACTA operates via Cytogeneticist with required local 10% match funding from [place]. ACTA provides over 80,000 passenger trips each year, including [facility] and [employer] sites.  Call at least by 11 AM one business day prior to needing transportation  [employer].                      [place], [place] [address]     Office Hours: Monday-Friday  8 AM - 5 PM  Agency Name: [facility] Agency Address: [address] [place], [place] [address] Phone: [phone] Website: www.alamanceservices.org Service(s) Offered: Housing services, self-sufficiency, congregate meal program, and individual development account program.  Agency Name: [facility] [place] Address: [address] [place], [place] [address] Phone: [phone] Email: [email or web address] Website: www.alliedchurches.org Service(s) Offered: Housing the homeless, feeding the hungry, community kitchen &  food pantry, job and education related services.  Agency Name: [facility] Address: [address], [place], [place] [address] Phone: [phone] Email: [email or web address] Service(s) Offered: Counseling, problem pregnancy, advocacy for Hispanics, limited emergency financial assistance.  Agency Name: Department of Social Services Address: [address] [place], [place] [address] Phone: [phone] Website: www.[email or web address]-.com/dss Service(s) Offered: Child support services; child welfare services; SNAP; Medicaid; work first family assistance; and aid with fuel,  rent, food and medicine.  Agency Name: Holiday representative Address: [address], [place], [place] [address] Phone: [phone] or [phone] Email: [email or web address] Service(s) Offered: Family services and transient assistance; emergency food, fuel, clothing, limited furniture, utilities; budget counseling, general counseling; give a kid a coat; thrift store; Christmas food and toys. Utility assistance, food pantry, rental  assistance, life sustaining medicine

## 2023-11-27 NOTE — ED Provider Notes (Addendum)
 There is evidence of DVT, will start on heparin , admission.    PROCEDURES:  Critical Care performed: Yes, see critical care procedure note(s)  .Critical Care  Performed by: Cyrena Mylar, MD Authorized by: Cyrena Mylar, MD   Critical care provider statement:    Critical care time (minutes):  30   Critical care was time spent personally by me on the following activities:  Development of treatment plan with patient or surrogate, discussions with consultants, evaluation of patient's response to treatment, examination of patient, ordering and review of laboratory studies, ordering and review of radiographic studies, ordering and performing treatments and interventions, pulse oximetry, re-evaluation of patient's condition and review of old charts       Cyrena Mylar, MD 11/27/23 0142    Cyrena Mylar, MD 11/27/23 [phone]

## 2023-11-27 NOTE — ED Notes (Signed)
 IV team at bedside

## 2023-11-27 NOTE — H&P (Signed)
 History and Physical    PatientRELLA Ashley FMW:[id number] DOB: Aug 05, 1985 DOA: 11/26/2023 DOS: the patient was seen and examined on 11/27/2023 PCP: Patient, No Pcp Per  Patient coming from: Homeless-currently is temporarily staying with friends but overall remains homeless  Chief Complaint:  Chief Complaint  Patient presents with   Leg Pain   HPI: Brianna Ashley is a 37 y.o. 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  She was last hospitalized in January of this year secondary to new occlusive thrombosis in the right lesser saphenous vein as well as nonocclusive acute thrombus in the distal right superficial femoral vein.  At that time [facility] assisted in helping the patient obtain her Eliquis  to take after discharge.  She apparently was having difficulty obtaining this medication despite having Medicaid.  She returned to the ED reporting bilateral leg pain x 2 weeks as well as acute onset of right arm redness and pain that began on the morning of 9/4.  Evaluation in the ED included upper and lower extremity duplex.  Lower extremity duplex revealed stable nonocclusive thrombus involving the left femoral vein, profunda femoris, popliteal vein.  Unfortunately there was an acute finding of right cephalic vein occlusive thrombus.  Fortunately the thrombus did not extend centrally to involve the central cephalic vein within the upper extremity.  Due to the degree of pain patient was experiencing hospital service has been consulted to evaluate the patient for admission.   Review of Systems: As mentioned in the history of present illness. All other systems reviewed and are negative.  When questioned the patient admitted to missing a few doses of her Eliquis .  Primarily complaining of significant  right upper extremity pain.  Past Medical History:  Diagnosis Date   Asthma    Carpal tunnel syndrome    DVT of lower extremity (deep venous thrombosis) (HCC)    Factor 5 Leiden mutation, heterozygous (HCC)    Migraine    Seizures (HCC)    Past Surgical History:  Procedure Laterality Date   ABSCESS DRAINAGE     CESAREAN SECTION     DILATION AND CURETTAGE OF UTERUS     INCISION AND DRAINAGE PERIRECTAL ABSCESS Right 09/18/2021   Procedure: IRRIGATION AND DEBRIDEMENT PERIRECTAL ABSCESS;  Surgeon: Lane Shope, MD;  Location: [facility] ORS;  Service: General;  Laterality: Right;   TUBAL LIGATION  2010   Social History:  reports that she has been smoking cigarettes. She has a 20 pack-year smoking history. She has never used smokeless tobacco. She reports that she does not drink alcohol and does not use drugs.  Allergies  Allergen Reactions   Ambien [Zolpidem Tartrate] Other (See Comments)    death   Ambien [Zolpidem Tartrate]    Clindamycin /Lincomycin Nausea And Vomiting   Keflex  [Cephalexin ] Nausea And Vomiting   Methylphenidate Other (See Comments)    I pretty much died in my dad's arms   Nicotine  Nausea And Vomiting   Ritalin [Methylphenidate Hcl] Other (See Comments)    death   Ritalin [Methylphenidate Hcl]    Zolpidem Other (See Comments)    I pretty much died a few times    Family History  Problem Relation Age of Onset   Factor V Leiden deficiency Mother        deceased of drug overdose    Prior to Admission medications  Medication Sig Start Date End Date Taking? Authorizing Provider  acetaminophen  (TYLENOL ) 500 MG tablet Take 1,000 mg by mouth every 4 (four) hours as needed for mild pain (pain score 1-3).   Yes [provider]  apixaban  (ELIQUIS ) 5 MG TABS tablet Take 2 tablets (10 mg total) by mouth 2 (two) times daily for 6 days, THEN 1 tablet (5 mg total) 2 (two) times daily. 04/21/23 11/27/23 Yes Sreenath, Sudheer B, MD  gabapentin  (NEURONTIN ) 300 MG  capsule Take 1 capsule (300 mg total) by mouth 3 (three) times daily. 04/21/23 11/27/23 Yes Jhonny Calvin NOVAK, MD  Homeopathic Products (EARACHE DROPS OT) Place 1 drop in ear(s) as needed.   Yes [provider]  ibuprofen  (ADVIL ) 400 MG tablet Take 400 mg by mouth every 4 (four) hours as needed for mild pain (pain score 1-3).   Yes [provider]  methocarbamol  (ROBAXIN ) 750 MG tablet Take 750 mg by mouth 3 (three) times daily.   Yes [provider]  HYDROcodone -acetaminophen  (NORCO/VICODIN) 5-325 MG tablet Take 1 tablet by mouth every 12 (twelve) hours as needed. Patient not taking: Reported on 11/27/2023 04/21/23   Jhonny Calvin NOVAK, MD  methylPREDNISolone  (MEDROL  DOSEPAK) 4 MG TBPK tablet Steroid taper.  Please take as per package directions Patient not taking: Reported on 11/27/2023 04/21/23   Jhonny Calvin NOVAK, MD  neomycin -bacitracin -polymyxin (NEOSPORIN) OINT Apply 1 Application topically daily. Patient not taking: Reported on 11/27/2023 04/21/23   Jhonny Calvin NOVAK, MD    Physical Exam: Vitals:   11/26/23 2300 11/26/23 2315 11/27/23 0134 11/27/23 0309  BP: (!) 102/58  129/62 101/86  Pulse: 83 (!) 54 71 60  Resp: 18 12 18 16   Temp:   98.2 F (36.8 C) 98.6 F (37 C)  TempSrc:   Oral Oral  SpO2: (!) 89% 100% 100% 98%  Weight:      Height:       Constitutional: NAD, calm, uncomfortable Respiratory: clear to auscultation bilaterally, no wheezing, no crackles. Normal respiratory effort. No accessory muscle use.  Room air Cardiovascular: Regular rate and rhythm, no murmurs / rubs / gallops. No extremity edema. 2+ pedal pulses. Abdomen: no tenderness, no masses palpated. No hepatosplenomegaly. Bowel sounds positive.  Musculoskeletal: no clubbing / cyanosis. No joint deformity upper and lower extremities. Good ROM, no contractures. Normal muscle tone.  Skin: no rashes, lesions, ulcers. No induration-circumferential redness which is very mild without erythema  involving the right forearm. Neurologic: CN 2-12 grossly intact. Sensation intact, DTR normal. Strength 5/5 x all 4 extremities.  Psychiatric: Normal judgment and insight. Alert and oriented x 3.      Data Reviewed:  Sodium 136, potassium 3.5, chloride 104, CO2 20, anion gap normal, BUN 15, creatinine 1.07  High-sensitivity troponin 7  WBC 11,200 differential not obtained, hemoglobin 13.6, platelets 273,000  Chest x-ray unremarkable  Extremity duplex exams as noted in HPI  CTA chest without PE, there is prominent reactive right hilar lymph nodes.  Also scattered pulmonary nodules measuring 3 mm or less.  No follow-up needed if patient considered low risk otherwise noncontrast chest CT recommended in 12 months with patient high risk  Assessment and Plan: Acute occlusive right cephalic vein DVT Chronic stable left lower extremity DVT Factor V Leiden mutation Suspect secondary to nonadherence to Eliquis  which is a recurrent problem Patient tells me that she is nearly out of refills for her Eliquis .  She has still yet to establish with a PCP.  She will need refills at discharge  Given occlusive nature of DVT will continue to use IV heparin  that was started in the ER for now and transition over to Eliquis  Will need TOC to assist with Eliquis  again prior to discharge Although her right upper extremity is red this is not unexpected with acute DVT.  Patient denied any recent IVs or other injections in this area/blood draws.  Do not suspect infection. Will use Percocet for acute pain and will allow for IV morphine  for breakthrough pain. Patient has chronic bilateral lower extremity pain secondary to chronic DVT.  Patient reports preadmission gabapentin  and Robaxin  was helping with this pain.  Multiple subcentimeter pulmonary nodules Unless patient high risk no need for serial imaging  History of substance abuse Previously has been positive for THC on urine drug screen No utility in  obtaining UDS at this point given patient has received several doses of opiates since arrival  Asthma Pulmonary status is stable Allow for DuoNeb as needed for symptoms  Depression/insomnia Continue bedtime trazadone   Advance Care Planning:   Code Status: Full Code   VTE prophylaxis: Heparin  to transition to Eliquis  in the next 24 hours  Consults: None  Family Communication: Patient only  Severity of Illness: The appropriate patient status for this patient is INPATIENT. Inpatient status is judged to be reasonable and necessary in order to provide the required intensity of service to ensure the patient's safety. The patient's presenting symptoms, physical exam findings, and initial radiographic and laboratory data in the context of their chronic comorbidities is felt to place them at high risk for further clinical deterioration. Furthermore, it is not anticipated that the patient will be medically stable for discharge from the hospital within 2 midnights of admission.   * I certify that at the point of admission it is my clinical judgment that the patient will require inpatient hospital care spanning beyond 2 midnights from the point of admission due to high intensity of service, high risk for further deterioration and high frequency of surveillance required.*  Author: Isaiah Lever, NP 11/27/2023 7:21 AM  For on call review [email or web address].

## 2023-11-27 NOTE — TOC CM/SW Note (Signed)
  Transition of Care (TOC) Screening Note   Patient Details  Name: Brianna Ashley Date of Birth: 1985/11/15   Transition of Care [facility]) CM/SW Contact:    Corean ONEIDA Haddock, RN Phone Number: 11/27/2023, 10:36 AM    Transition of Care Department [facility]) has reviewed patient and no TOC needs have been identified at this time.  If new patient transition needs arise, please place a TOC consult.  TOC consult notified for Medication assistance.  Per Chart review patient has not established with PCP in order to obtain refills for eliquis .  Patient has medication coverage through [employer].  MD notified that IP Care Management would not be able to provide medication assistance at discharge as patient is insured.  Elqiuis can be ordered at discharge, and patient will need to establish with PCP in order to obtain refills.  Provided with Member and Recipient Service Line at [phone] on discharge AVS in order for patient to arrange follow up with a provider in network.   Per SDOH Food, housing, transport, and utility resources added to AVS.  Per Chart review patient has been staying with friends.

## 2023-11-27 NOTE — ED Notes (Signed)
Pharmacy at bedside

## 2023-11-28 DIAGNOSIS — I82811 Embolism and thrombosis of superficial veins of right lower extremities: Secondary | ICD-10-CM

## 2023-11-28 DIAGNOSIS — I82512 Chronic embolism and thrombosis of left femoral vein: Secondary | ICD-10-CM | POA: Diagnosis not present

## 2023-11-28 LAB — CBC
HCT: 37.5 % (ref 36.0–46.0)
Hemoglobin: 12.8 g/dL (ref 12.0–15.0)
MCH: 30.9 pg (ref 26.0–34.0)
MCHC: 34.1 g/dL (ref 30.0–36.0)
MCV: 90.6 fL (ref 80.0–100.0)
Platelets: 235 K/uL (ref 150–400)
RBC: 4.14 MIL/uL (ref 3.87–5.11)
RDW: 13.2 % (ref 11.5–15.5)
WBC: 7.4 K/uL (ref 4.0–10.5)
nRBC: 0 % (ref 0.0–0.2)

## 2023-11-28 LAB — HEPARIN LEVEL (UNFRACTIONATED): Heparin Unfractionated: 0.67 [IU]/mL (ref 0.30–0.70)

## 2023-11-28 LAB — APTT: aPTT: 92 s — ABNORMAL HIGH (ref 24–36)

## 2023-11-28 MED ORDER — APIXABAN 5 MG PO TABS
10.0000 mg | ORAL_TABLET | Freq: Two times a day (BID) | ORAL | Status: DC
  Administered 2023-11-28 – 2023-11-30 (×4): 10 mg via ORAL
  Filled 2023-11-28 (×4): qty 2

## 2023-11-28 MED ORDER — APIXABAN 5 MG PO TABS: 5.0000 mg | ORAL_TABLET | Freq: Two times a day (BID) | ORAL | Status: DC

## 2023-11-28 NOTE — Plan of Care (Signed)

## 2023-11-28 NOTE — Progress Notes (Signed)
  Progress Note   Patient: Brianna Ashley FMW:[id number] DOB: Nov 25, 1985 DOA: 11/26/2023     1 DOS: the patient was seen and examined on 11/28/2023   Brief hospital course: Brianna Ashley is a 38 y.o. 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   Presented with R arm pain. Found to have R cephalic vein DVT. BLE with chronic stable DVTs. She was initially on   Assessment and Plan: Chronic BLE DVTs Factor V leiden Transition to eliquis  Patient will need assistance with filling this prescription  Pain control   R arm pain  In the setting of R cephalic vein superficial vein thrombus.   Multiple subcentimeter pulmonary nodules Unless patient high risk no need for serial imaging   H/o substance use  Noted.   Asthma  Not in exacerbation.  Depression/insomnia Continue home trazodone      Subjective: Pain in R arm otherwise no other complaints.   Physical Exam: Vitals:   11/27/23 1833 11/27/23 2105 11/28/23 0432 11/28/23 0739  BP: (!) 104/57 120/65 (!) 117/55 (!) 111/50  Pulse: 60 (!) 56 (!) 59 (!) 46  Resp: 16 16 16 16   Temp: 98.4 F (36.9 C) 98.3 F (36.8 C) 98 F (36.7 C) 97.8 F (36.6 C)  TempSrc: Oral Oral  Oral  SpO2: 100% 100% 97% 100%  Weight:      Height:       Physical Exam  Constitutional: In no distress.  Cardiovascular: Normal rate, regular rhythm. No lower extremity edema  Pulmonary: Non labored breathing on room air, no wheezing or rales.   Abdominal: Soft. Non distended and non tender Musculoskeletal: Normal range of motion.     Neurological: Alert and oriented to person, place, and time. Non focal  Skin: Skin is warm and dry. RUE TTP mild swelling.   Data Reviewed:   CTA chest without PE, there is prominent reactive right hilar lymph nodes.   Also scattered pulmonary nodules measuring 3 mm or less.  No follow-up needed if patient considered low risk otherwise noncontrast chest CT recommended in 12 months with patient high risk   Lower extremity duplex revealed stable nonocclusive thrombus involving the left femoral vein, profunda femoris, popliteal vein.  Stable occlusive thrombus R lesser saphenous vein   Family Communication: None, patient aware of plan, questions answered.   Disposition: Status is: Inpatient Remains inpatient appropriate because: Transition to eliquis , has difficulty obtaining eliquis  outside of [facility] pharmacy   Planned Discharge Destination: Home Barriers to discharge obtaining medications     Time spent: 35 minutes  Author: Alban Pepper, MD 11/28/2023 6:33 PM  For on call review [email or web address].

## 2023-11-28 NOTE — Progress Notes (Signed)
 ANTICOAGULATION CONSULT NOTE  Pharmacy Consult for heparin  infusion Indication: VTE Tx  Allergies  Allergen Reactions   Ambien [Zolpidem Tartrate] Other (See Comments)    death   Ambien [Zolpidem Tartrate]    Clindamycin /Lincomycin Nausea And Vomiting   Keflex  [Cephalexin ] Nausea And Vomiting   Methylphenidate Other (See Comments)    I pretty much died in my dad's arms   Nicotine  Nausea And Vomiting   Ritalin [Methylphenidate Hcl] Other (See Comments)    death   Ritalin [Methylphenidate Hcl]    Zolpidem Other (See Comments)    I pretty much died a few times    Patient Measurements: Height: 5' 5 (165.1 cm) Weight: 110 kg (242 lb 8.1 oz) IBW/kg (Calculated) : 57 HEPARIN  DW (KG): 82.9  Vital Signs: Temp: 98 F (36.7 C) (09/06 0432) Temp Source: Oral (09/05 2105) BP: 117/55 (09/06 0432) Pulse Rate: 59 (09/06 0432)  Labs: Recent Labs    11/26/23 2125 11/26/23 2226 11/27/23 0116 11/27/23 0201 11/27/23 0947 11/27/23 1910 11/28/23 0341  HGB 13.6  --   --   --  12.4  --  12.8  HCT 39.2  --   --   --  35.1*  --  37.5  PLT 273  --   --   --  239  --  235  APTT  --    < >  --   --  51* 83* 92*  LABPROT  --   --   --  15.1  --   --   --   INR  --   --   --  1.1  --   --   --   HEPARINUNFRC  --   --   --  >1.10*  --   --  0.67  CREATININE 1.07*  --   --   --  0.85  --   --   TROPONINIHS 7  --  8  --   --   --   --    < > = values in this interval not displayed.    Estimated Creatinine Clearance: 111.9 mL/min (by C-G formula based on SCr of 0.85 mg/dL).   Medical History: Past Medical History:  Diagnosis Date   Asthma    Carpal tunnel syndrome    DVT of lower extremity (deep venous thrombosis) (HCC)    Factor 5 Leiden mutation, heterozygous (HCC)    Migraine    Seizures (HCC)     Medications:  PTA Meds: Apixaban  5 mg BID, last dose 11/26/23 around 2PM  Assessment: Pt is a 38 yo female with h/o DVT and Factor 5 Leiden on Eliquis  presenting to ED c/o  bilateral leg pain x 2 weeks. Right arm was hurting when she woke up, area of redness on forearm.  Pt found with stable nonocclusive thrombus within the left femoral vein, profundus femoral vein, and popliteal vein, stable occlusive thrombus within the right lesser saphenous vein, and occlusive thrombus within the right cephalic vein within the forearm.  Goal of Therapy:  Heparin  level 0.3-0.7 units/ml aPTT 66-102 seconds Monitor platelets by anticoagulation protocol: Yes  9/5 0947: aPTT 51, HL > 1.1, SUBtherapeutic  9/5 1910 aPTT 83, therapeutic x 1 9/6 0341 aPTT 92, therapeutic x 2 / HL 0.67, correlating x 1    Plan:  Continue heparin  infusion at 1750 units/hr Will follow aPTT until correlation w/ HL confirmed Will recheck aPTT daily w/ AM labs while therapeutic HL & CBC daily while on heparin   Thank  you for involving pharmacy in this patient's care.   Rankin CANDIE Dills, PharmD, [facility] 11/28/2023 4:45 AM

## 2023-11-28 NOTE — Progress Notes (Signed)
 ANTICOAGULATION CONSULT NOTE  Pharmacy Consult for heparin  infusion transition to Apixaban  Indication: VTE Tx  Allergies  Allergen Reactions   Ambien [Zolpidem Tartrate] Other (See Comments)    death   Ambien [Zolpidem Tartrate]    Clindamycin /Lincomycin Nausea And Vomiting   Keflex  [Cephalexin ] Nausea And Vomiting   Methylphenidate Other (See Comments)    I pretty much died in my dad's arms   Nicotine  Nausea And Vomiting   Ritalin [Methylphenidate Hcl] Other (See Comments)    death   Ritalin [Methylphenidate Hcl]    Zolpidem Other (See Comments)    I pretty much died a few times    Patient Measurements: Height: 5' 5 (165.1 cm) Weight: 110 kg (242 lb 8.1 oz) IBW/kg (Calculated) : 57 HEPARIN  DW (KG): 82.9  Vital Signs: Temp: 97.8 F (36.6 C) (09/06 0739) Temp Source: Oral (09/06 0739) BP: 111/50 (09/06 0739) Pulse Rate: 46 (09/06 0739)  Labs: Recent Labs    11/26/23 2125 11/26/23 2226 11/27/23 0116 11/27/23 0201 11/27/23 0947 11/27/23 1910 11/28/23 0341  HGB 13.6  --   --   --  12.4  --  12.8  HCT 39.2  --   --   --  35.1*  --  37.5  PLT 273  --   --   --  239  --  235  APTT  --    < >  --   --  51* 83* 92*  LABPROT  --   --   --  15.1  --   --   --   INR  --   --   --  1.1  --   --   --   HEPARINUNFRC  --   --   --  >1.10*  --   --  0.67  CREATININE 1.07*  --   --   --  0.85  --   --   TROPONINIHS 7  --  8  --   --   --   --    < > = values in this interval not displayed.    Estimated Creatinine Clearance: 111.9 mL/min (by C-G formula based on SCr of 0.85 mg/dL).   Medical History: Past Medical History:  Diagnosis Date   Asthma    Carpal tunnel syndrome    DVT of lower extremity (deep venous thrombosis) (HCC)    Factor 5 Leiden mutation, heterozygous (HCC)    Migraine    Seizures (HCC)     Medications:  PTA Meds: Apixaban  5 mg BID, last dose 11/26/23 around 2PM  Assessment: Pt is a 38 yo female with h/o DVT and Factor 5 Leiden on Eliquis   presenting to ED c/o bilateral leg pain x 2 weeks. Right arm was hurting when she woke up, area of redness on forearm.  Pt found with stable nonocclusive thrombus within the left femoral vein, profundus femoral vein, and popliteal vein, stable occlusive thrombus within the right lesser saphenous vein, and occlusive thrombus within the right cephalic vein within the forearm.  Goal of Therapy:  Heparin  level 0.3-0.7 units/ml aPTT 66-102 seconds Monitor platelets by anticoagulation protocol: Yes  9/5 0947: aPTT 51, HL > 1.1, SUBtherapeutic  9/5 1910 aPTT 83, therapeutic x 1 9/6 0341 aPTT 92, therapeutic x 2 / HL 0.67, correlating x 1    Plan:  Stop heparin  infusion at 21:00 Begin Apixaban  10mg  PO bid for 7 days followed by 5mg  PO bid  Thank you for involving pharmacy in this patient's care.  Olam Fritter, PharmD, BCPS 11/28/2023 6:17 PM

## 2023-11-28 NOTE — Plan of Care (Signed)

## 2023-11-29 DIAGNOSIS — I82512 Chronic embolism and thrombosis of left femoral vein: Secondary | ICD-10-CM | POA: Diagnosis not present

## 2023-11-29 DIAGNOSIS — I82811 Embolism and thrombosis of superficial veins of right lower extremities: Secondary | ICD-10-CM | POA: Diagnosis not present

## 2023-11-29 LAB — CBC
HCT: 35.5 % — ABNORMAL LOW (ref 36.0–46.0)
Hemoglobin: 12.2 g/dL (ref 12.0–15.0)
MCH: 31 pg (ref 26.0–34.0)
MCHC: 34.4 g/dL (ref 30.0–36.0)
MCV: 90.3 fL (ref 80.0–100.0)
Platelets: 226 K/uL (ref 150–400)
RBC: 3.93 MIL/uL (ref 3.87–5.11)
RDW: 12.9 % (ref 11.5–15.5)
WBC: 7.5 K/uL (ref 4.0–10.5)
nRBC: 0 % (ref 0.0–0.2)

## 2023-11-29 MED ORDER — GABAPENTIN 300 MG PO CAPS
300.0000 mg | ORAL_CAPSULE | Freq: Three times a day (TID) | ORAL | 1 refills | Status: AC
  Filled 2023-11-29: qty 90, 30d supply, fill #0
  Filled 2024-02-08: qty 90, 30d supply, fill #1

## 2023-11-29 MED ORDER — METHOCARBAMOL 750 MG PO TABS
750.0000 mg | ORAL_TABLET | Freq: Three times a day (TID) | ORAL | 1 refills | Status: AC
  Filled 2023-11-29: qty 90, 30d supply, fill #0
  Filled 2024-02-08: qty 90, 30d supply, fill #1

## 2023-11-29 MED ORDER — APIXABAN 5 MG PO TABS
ORAL_TABLET | ORAL | 2 refills | Status: AC
  Filled 2023-11-29: qty 60, 25d supply, fill #0
  Filled 2024-02-08: qty 60, 10d supply, fill #1

## 2023-11-29 MED ORDER — ACETAMINOPHEN 500 MG PO TABS: 500.0000 mg | ORAL_TABLET | ORAL | Status: AC | PRN

## 2023-11-29 MED ORDER — OXYCODONE HCL 5 MG PO TABS
5.0000 mg | ORAL_TABLET | Freq: Four times a day (QID) | ORAL | 0 refills | Status: AC | PRN
  Filled 2023-11-29: qty 10, 3d supply, fill #0

## 2023-11-29 NOTE — Plan of Care (Signed)

## 2023-11-29 NOTE — Progress Notes (Signed)
  Progress Note   Patient: Brianna Ashley FMW:[id number] DOB: [birth year] DOA: 11/26/2023     2 DOS: the patient was seen and examined on 11/29/2023   Brief hospital course: Brianna Ashley is a 38 y.o. 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   Presented with R arm pain. Found to have R cephalic vein SVT. BLE with chronic stable DVTs. She was initially on   Assessment and Plan: Chronic BLE DVTs RUE SVT Factor V leiden Transition to eliquis  Patient will need assistance with filling this prescription  Pain control   R arm pain  In the setting of R cephalic vein superficial vein thrombus.  Continue pain regimen   Multiple subcentimeter pulmonary nodules Unless patient high risk no need for serial imaging   H/o substance use  Noted.   Asthma  Not in exacerbation.  Depression/insomnia Continue home trazodone      Subjective: No acute complaints. No dyspnea or chest pain.   Physical Exam: Vitals:   11/29/23 0400 11/29/23 0753 11/29/23 0813 11/29/23 1401  BP: 123/61 (!) 94/42 (!) 108/57 (!) 107/48  Pulse: 64 (!) 53 62 64  Resp: 20 14  16   Temp: 98.4 F (36.9 C) 99.4 F (37.4 C)  97.9 F (36.6 C)  TempSrc: Oral Oral    SpO2: 100% 98%  97%  Weight:      Height:        Constitutional: In no distress.  Cardiovascular: Normal rate, regular rhythm. No lower extremity edema  Pulmonary: Non labored breathing on room air, no wheezing or rales.   Abdominal: Soft. Non distended and non tender Musculoskeletal: Normal range of motion.     Neurological: Alert and oriented to person, place, and time. Non focal  Skin: Skin is warm and dry. RUE mildly TP, mild swelling.   Data Reviewed:   CTA chest without PE, there is prominent reactive right hilar lymph nodes.  Also  scattered pulmonary nodules measuring 3 mm or less.  No follow-up needed if patient considered low risk otherwise noncontrast chest CT recommended in 12 months with patient high risk   Lower extremity duplex revealed stable nonocclusive thrombus involving the left femoral vein, profunda femoris, popliteal vein.  Stable occlusive thrombus R lesser saphenous vein   Family Communication: None, patient aware of plan, questions answered.   Disposition: Status is: Inpatient Remains inpatient appropriate because: Transition to eliquis , has difficulty obtaining eliquis  outside of [facility] pharmacy   Planned Discharge Destination: Home Barriers to discharge obtaining medications     Time spent: 35 minutes  Author: Alban Pepper, MD 11/29/2023 6:43 PM  For on call review [email or web address].

## 2023-11-30 ENCOUNTER — Other Ambulatory Visit: Payer: Self-pay

## 2023-11-30 ENCOUNTER — Other Ambulatory Visit (HOSPITAL_COMMUNITY): Payer: Self-pay

## 2023-11-30 ENCOUNTER — Telehealth (HOSPITAL_COMMUNITY): Payer: Self-pay | Admitting: Pharmacy Technician

## 2023-11-30 DIAGNOSIS — I82611 Acute embolism and thrombosis of superficial veins of right upper extremity: Principal | ICD-10-CM

## 2023-11-30 MED ORDER — ALBUTEROL SULFATE HFA 108 (90 BASE) MCG/ACT IN AERS
2.0000 | INHALATION_SPRAY | Freq: Four times a day (QID) | RESPIRATORY_TRACT | 1 refills | Status: AC | PRN
  Filled 2023-11-30: qty 18, 25d supply, fill #0
  Filled 2024-02-08: qty 18, 25d supply, fill #1

## 2023-11-30 NOTE — Progress Notes (Signed)
 Spoke with patient about staff entering room to investigate smell. Patient stated that 3 staff member including a female staff member entered her room after her RN had just left. The patient expressed that she felt she was being targeted by the staff and picked on and that there was no reason for staff to be entering her room. I attempted to clarify staff responsibilities for patient safety and knocking before entering. Patient repeated that she felt she was being targeted by staff and asked to speak with her assigned RN Lolita Novak.

## 2023-11-30 NOTE — Telephone Encounter (Signed)
 Patient Product/process development scientist completed.    The patient is insured through [employer] [place].     Ran test claim for Eliquis  Starter Pack and the current 30 day co-pay is $4.00.   This test claim was processed through [facility]- copay amounts may vary at other pharmacies due to pharmacy/plan contracts, or as the patient moves through the different stages of their insurance plan.     Reyes Sharps, CPHT Pharmacy Technician III Certified Patient Advocate [facility] Patient Advocate Team Direct Number: [phone]  Fax: [phone]

## 2023-11-30 NOTE — Discharge Summary (Signed)
 Physician Discharge Summary   Patient: Brianna Ashley MRN: [id number] DOB: 1985/05/21  Admit date:     11/26/2023  Discharge date: 11/30/2023  Discharge Physician: Burnard DELENA Cunning   PCP: Patient, No Pcp Per   Recommendations at discharge:    Follow up with Primary Care in 1-2 weeks Follow on up continuation and compliance with Eliquis   Discharge Diagnoses: Principal Problem:   Left femoral vein DVT (HCC)  Resolved Problems:   * No resolved hospital problems. *  Hospital Course:  HPI on admission: Brianna Ashley is a 38 y.o. 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  She was last hospitalized in January of this year secondary to new occlusive thrombosis in the right lesser saphenous vein as well as nonocclusive acute thrombus in the distal right superficial femoral vein.  At that time [facility] assisted in helping the patient obtain her Eliquis  to take after discharge.  She apparently was having difficulty obtaining this medication despite having Medicaid.  She returned to the ED reporting bilateral leg pain x 2 weeks as well as acute onset of right arm redness and pain that began on the morning of 9/4.  Evaluation in the ED included upper and lower extremity duplex.  Lower extremity duplex revealed stable nonocclusive thrombus involving the left femoral vein, profunda femoris, popliteal vein.  Unfortunately there was an acute finding of right cephalic vein occlusive thrombus.  Fortunately the thrombus did not extend centrally to involve the central cephalic vein within the upper extremity.  Due to the degree of pain patient was experiencing hospital service has been consulted to evaluate the patient for admission.   Lower extremity doppler U/S -- showed stable chronic DVT's.   Upper  extremity doppler U/S -- positive for R cephalic vein thrombus in the forearm that does not extend centrally to involve the central cephalic vein within the upper extremity.  No evidence of DVT within the left upper extremity. CTA chest showed no evidence of PE.   Assessment and Plan:  Chronic BLE DVTs Right SVT - R cephalic vein in forearm Factor V leiden R arm pain - due to above Discharge on Eliquis  -- Rx was filled to bedside prior to d/c Pain control PRN PCP follow up -- follow up on compliance routinely     Multiple subcentimeter pulmonary nodules Unless patient high risk no need for serial imaging     H/o substance use  Noted.    Asthma  Not in exacerbation.   Depression/insomnia Continue home trazodone         Consultants: None Procedures performed: None  Disposition: Home Diet recommendation:  Regular diet DISCHARGE MEDICATION: Allergies as of 11/30/2023       Reactions   Ambien [zolpidem Tartrate] Other (See Comments)   death   Ambien [zolpidem Tartrate]    Clindamycin /lincomycin Nausea And Vomiting   Keflex  [cephalexin ] Nausea And Vomiting   Methylphenidate Other (See Comments)   I pretty much died in my dad's arms   Nicotine  Nausea And Vomiting   Ritalin [methylphenidate Hcl] Other (See Comments)   death   Ritalin [methylphenidate Hcl]    Zolpidem Other (See Comments)   I pretty much died a few times        Medication List     STOP taking these medications    HYDROcodone -acetaminophen  5-325 MG tablet Commonly known  as: NORCO/VICODIN   methylPREDNISolone  4 MG Tbpk tablet Commonly known as: MEDROL  DOSEPAK   neomycin -bacitracin -polymyxin [phone] Oint       TAKE these medications    acetaminophen  500 MG tablet Commonly known as: TYLENOL  Take 1 tablet (500 mg total) by mouth every 4 (four) hours as needed for mild pain (pain score 1-3). What changed: how much to take   EARACHE DROPS OT Place 1 drop in ear(s) as needed.    Eliquis  5 MG Tabs tablet Generic drug: apixaban  Take 2 tablets (10 mg total) by mouth 2 (two) times daily for 5 days, THEN 1 tablet (5 mg total) 2 (two) times daily. Start taking on: November 29, 2023 What changed: See the new instructions.   gabapentin  300 MG capsule Commonly known as: NEURONTIN  Take 1 capsule (300 mg total) by mouth 3 (three) times daily.   ibuprofen  400 MG tablet Commonly known as: ADVIL  Take 400 mg by mouth every 4 (four) hours as needed for mild pain (pain score 1-3).   methocarbamol  750 MG tablet Commonly known as: ROBAXIN  Take 1 tablet (750 mg total) by mouth 3 (three) times daily.   oxyCODONE  5 MG immediate release tablet Commonly known as: Roxicodone  Take 1 tablet (5 mg total) by mouth every 6 (six) hours as needed for up to 5 days for severe pain (pain score 7-10).        Discharge Exam: Filed Weights   11/26/23 2126  Weight: 110 kg   General exam: awake, alert, no acute distress HEENT: moist mucus membranes, hearing grossly normal  Respiratory system: CTAB, no wheezes, rales or rhonchi, normal respiratory effort. Cardiovascular system: normal S1/S2, RRR.   Gastrointestinal system: soft, NT, ND Central nervous system: A&O x3. no gross focal neurologic deficits, normal speech Skin: dry, intact, normal temperature Psychiatry: hyperactive mood, congruent affect  Condition at discharge: stable  The results of significant diagnostics from this hospitalization (including imaging, microbiology, ancillary and laboratory) are listed below for reference.   Imaging Studies: US  Venous Img Upper Bilat Result Date: 11/27/2023 CLINICAL DATA:  Factor V Leiden, noncompliant with Eliquis , feeling sensation of DVT in all 4 extremities EXAM: BILATERAL UPPER EXTREMITY VENOUS DOPPLER ULTRASOUND TECHNIQUE: Gray-scale sonography with graded compression, as well as color Doppler and duplex ultrasound were performed to evaluate the bilateral upper extremity deep venous  systems from the level of the subclavian vein and including the jugular, axillary, basilic, radial, ulnar and upper cephalic vein. Spectral Doppler was utilized to evaluate flow at rest and with distal augmentation maneuvers. COMPARISON:  None Available. FINDINGS: RIGHT UPPER EXTREMITY Internal Jugular Vein: No evidence of thrombus. Normal compressibility, respiratory phasicity and response to augmentation. Subclavian Vein: No evidence of thrombus. Normal compressibility, respiratory phasicity and response to augmentation. Axillary Vein: No evidence of thrombus. Normal compressibility, respiratory phasicity and response to augmentation. Cephalic Vein: There is expansile, mixed echogenicity occlusive thrombus within the cephalic vein within the forearm. This does not extend centrally to involve the central cephalic vein within the upper extremity Basilic Vein: No evidence of thrombus. Normal compressibility, respiratory phasicity and response to augmentation. Brachial Veins: No evidence of thrombus. Normal compressibility, respiratory phasicity and response to augmentation. Radial Veins: No evidence of thrombus. Normal compressibility, respiratory phasicity and response to augmentation. Ulnar Veins: No evidence of thrombus. Normal compressibility, respiratory phasicity and response to augmentation. Venous Reflux:  None. Other Findings:  None. LEFT UPPER EXTREMITY Internal Jugular Vein: No evidence of thrombus. Normal compressibility, respiratory phasicity and response to augmentation. Subclavian Vein:  No evidence of thrombus. Normal compressibility, respiratory phasicity and response to augmentation. Axillary Vein: No evidence of thrombus. Normal compressibility, respiratory phasicity and response to augmentation. Cephalic Vein: No evidence of thrombus. Normal compressibility, respiratory phasicity and response to augmentation. Basilic Vein: No evidence of thrombus. Normal compressibility, respiratory phasicity and  response to augmentation. Brachial Veins: No evidence of thrombus. Normal compressibility, respiratory phasicity and response to augmentation. Radial Veins: No evidence of thrombus. Normal compressibility, respiratory phasicity and response to augmentation. Ulnar Veins: No evidence of thrombus. Normal compressibility, respiratory phasicity and response to augmentation. Venous Reflux:  None. Other Findings:  None. IMPRESSION: 1. Occlusive thrombus within the right cephalic vein within the forearm. This does not extend centrally to involve the central cephalic vein within the upper extremity. 2. No evidence of DVT within the left upper extremity. Electronically Signed   By: Dorethia Molt M.D.   On: 11/27/2023 01:37   CT Angio Chest PE W and/or Wo Contrast Result Date: 11/27/2023 CLINICAL DATA:  Factor 5 Leiden.  Evaluate for PE. EXAM: CT ANGIOGRAPHY CHEST WITH CONTRAST TECHNIQUE: Multidetector CT imaging of the chest was performed using the standard protocol during bolus administration of intravenous contrast. Multiplanar CT image reconstructions and MIPs were obtained to evaluate the vascular anatomy. RADIATION DOSE REDUCTION: This exam was performed according to the departmental dose-optimization program which includes automated exposure control, adjustment of the mA and/or kV according to patient size and/or use of iterative reconstruction technique. CONTRAST:  75mL OMNIPAQUE  IOHEXOL  350 MG/ML SOLN COMPARISON:  CT chest abdomen and pelvis 03/31/2020 FINDINGS: Cardiovascular: Satisfactory opacification of the pulmonary arteries to the segmental level. No evidence of pulmonary embolism. Normal heart size. No pericardial effusion. Mediastinum/Nodes: No enlarged mediastinal, hilar, or axillary lymph nodes. Thyroid  gland, trachea, and esophagus demonstrate no significant findings. There some prominent right hilar lymph nodes. Lungs/Pleura: There some scattered pulmonary nodules measuring 3 mm or less. The lungs are  otherwise clear. There is no pleural effusion or pneumothorax. Upper Abdomen: No acute abnormality. Musculoskeletal: No chest wall abnormality. No acute or significant osseous findings. Review of the MIP images confirms the above findings. IMPRESSION: 1. No evidence for pulmonary embolism. 2. Prominent right hilar lymph nodes, likely reactive. 3. Scattered pulmonary nodules measuring 3 mm or less. No follow-up needed if patient is low-risk (and has no known or suspected primary neoplasm). Non-contrast chest CT can be considered in 12 months if patient is high-risk. This recommendation follows the consensus statement: Guidelines for Management of Incidental Pulmonary Nodules Detected on CT Images: From the Fleischner Society 2017; Radiology 2017; 284:228-243. Electronically Signed   By: Greig Pique M.D.   On: 11/27/2023 01:36   US  Venous Img Lower Bilateral Result Date: 11/27/2023 CLINICAL DATA:  Factor V Leiden, noncompliant with Eliquis , feeling sensation of DVT in all 4 extremities EXAM: BILATERAL LOWER EXTREMITY VENOUS DOPPLER ULTRASOUND TECHNIQUE: Gray-scale sonography with compression, as well as color and duplex ultrasound, were performed to evaluate the deep venous system(s) from the level of the common femoral vein through the popliteal and proximal calf veins. COMPARISON:  04/19/2023 FINDINGS: VENOUS Within the right lower extremity, there is normal compressibility of the common femoral, superficial femoral, and popliteal veins, as well as the visualized calf veins. Visualized portions of profunda femoral vein and great saphenous vein unremarkable. No filling defects to suggest DVT on grayscale or color Doppler imaging within these vessels. Doppler waveforms show normal direction of venous flow, normal respiratory plasticity and response to augmentation. There is again noted occlusive  thrombus within the lesser saphenous vein which does not extend into the popliteal vein itself. Nonocclusive thrombus is  seen within the left femoral vein, profundus femoral vein, and popliteal vein, similar to prior examination. No new occlusive thrombus is identified OTHER None. Limitations: none IMPRESSION: 1. Stable nonocclusive thrombus within the left femoral vein, profundus femoral vein, and popliteal vein. 2. Stable occlusive thrombus within the right lesser saphenous vein. 3. No new occlusive thrombus is identified. Electronically Signed   By: Dorethia Molt M.D.   On: 11/27/2023 01:35   DG Chest 1 View Result Date: 11/26/2023 CLINICAL DATA:  [id number].  Chest pain. [id number].  Shortness of breath. Bilateral lower extremity pain for 2 weeks.  Headaches. EXAM: CHEST  1 VIEW COMPARISON:  Chest PA Lat 02/03/2017, chest, abdomen and pelvis CT with IV contrast 03/31/2020. FINDINGS: The heart size and mediastinal contours are within normal limits. Both lungs are clear. The visualized skeletal structures are unremarkable. IMPRESSION: No active disease.  Stable chest. Electronically Signed   By: Francis Quam M.D.   On: 11/26/2023 21:48    Microbiology: Results for orders placed or performed during the hospital encounter of 02/02/22  Group A Strep by PCR ([facility] Only)     Status: Abnormal   Collection Time: 02/02/22 11:06 AM   Specimen: Throat; Sterile Swab  Result Value Ref Range Status   Group A Strep by PCR DETECTED (A) NOT DETECTED Final    Comment: Performed at [facility], [address] [place], [place] [address]  Resp Panel by RT-PCR (Flu A&B, Covid) Throat     Status: None   Collection Time: 02/02/22 11:06 AM   Specimen: Throat; Nasal Swab  Result Value Ref Range Status   SARS Coronavirus 2 by RT PCR NEGATIVE NEGATIVE Final    Comment: (NOTE) SARS-CoV-2 target nucleic acids are NOT DETECTED.  The SARS-CoV-2 RNA is generally detectable in upper respiratory specimens during the acute phase of infection. The lowest concentration of SARS-CoV-2 viral copies this assay can detect is 138 copies/mL. A  negative result does not preclude SARS-Cov-2 infection and should not be used as the sole basis for treatment or other patient management decisions. A negative result may occur with  improper specimen collection/handling, submission of specimen other than nasopharyngeal swab, presence of viral mutation(s) within the areas targeted by this assay, and inadequate number of viral copies(<138 copies/mL). A negative result must be combined with clinical observations, patient history, and epidemiological information. The expected result is Negative.  Fact Sheet for Patients:  BloggerCourse.com  Fact Sheet for Healthcare Providers:  SeriousBroker.it  This test is no t yet approved or cleared by the [place]  FDA and  has been authorized for detection and/or diagnosis of SARS-CoV-2 by FDA under an Emergency Use Authorization (EUA). This EUA will remain  in effect (meaning this test can be used) for the duration of the COVID-19 declaration under Section 564(b)(1) of the Act, 21 U.S.C.section 360bbb-3(b)(1), unless the authorization is terminated  or revoked sooner.       Influenza A by PCR NEGATIVE NEGATIVE Final   Influenza B by PCR NEGATIVE NEGATIVE Final    Comment: (NOTE) The Xpert Xpress SARS-CoV-2/FLU/RSV plus assay is intended as an aid in the diagnosis of influenza from Nasopharyngeal swab specimens and should not be used as a sole basis for treatment. Nasal washings and aspirates are unacceptable for Xpert Xpress SARS-CoV-2/FLU/RSV testing.  Fact Sheet for Patients: BloggerCourse.com  Fact Sheet for Healthcare Providers: SeriousBroker.it  This test  is not yet approved or cleared by the [place]  FDA and has been authorized for detection and/or diagnosis of SARS-CoV-2 by FDA under an Emergency Use Authorization (EUA). This EUA will remain in effect (meaning this test can  be used) for the duration of the COVID-19 declaration under Section 564(b)(1) of the Act, 21 U.S.C. section 360bbb-3(b)(1), unless the authorization is terminated or revoked.  Performed at [facility], [address] [place], [place] [address]     Labs: CBC: Recent Labs  Lab 11/26/23 2125 11/27/23 0947 11/28/23 0341 11/29/23 0427  WBC 11.2* 8.3 7.4 7.5  HGB 13.6 12.4 12.8 12.2  HCT 39.2 35.1* 37.5 35.5*  MCV 89.7 89.8 90.6 90.3  PLT 273 239 235 226   Basic Metabolic Panel: Recent Labs  Lab 11/26/23 2125 11/27/23 0947  NA 136 137  K 3.5 3.7  CL 104 104  CO2 20* 22  GLUCOSE 99 133*  BUN 15 14  CREATININE 1.07* 0.85  CALCIUM 9.0 8.5*   Liver Function Tests: No results for input(s): AST, ALT, ALKPHOS, BILITOT, PROT, ALBUMIN in the last 168 hours. CBG: No results for input(s): GLUCAP in the last 168 hours.  Discharge time spent: less than 30 minutes.  Signed: Burnard DELENA Cunning, DO [facility] 11/30/2023

## 2024-02-08 ENCOUNTER — Other Ambulatory Visit: Payer: Self-pay

## 2024-02-11 ENCOUNTER — Emergency Department: Payer: MEDICAID

## 2024-02-11 ENCOUNTER — Other Ambulatory Visit: Payer: Self-pay

## 2024-02-11 ENCOUNTER — Emergency Department
Admission: EM | Admit: 2024-02-11 | Discharge: 2024-02-11 | Disposition: A | Discharge status: Home / Self Care | Payer: MEDICAID | Attending: Emergency Medicine | Admitting: Emergency Medicine

## 2024-02-11 ENCOUNTER — Encounter: Payer: Self-pay | Admitting: Emergency Medicine

## 2024-02-11 DIAGNOSIS — I82409 Acute embolism and thrombosis of unspecified deep veins of unspecified lower extremity: Secondary | ICD-10-CM | POA: Diagnosis not present

## 2024-02-11 DIAGNOSIS — Z7901 Long term (current) use of anticoagulants: Secondary | ICD-10-CM | POA: Insufficient documentation

## 2024-02-11 DIAGNOSIS — J029 Acute pharyngitis, unspecified: Secondary | ICD-10-CM | POA: Diagnosis present

## 2024-02-11 DIAGNOSIS — J020 Streptococcal pharyngitis: Secondary | ICD-10-CM | POA: Insufficient documentation

## 2024-02-11 LAB — POC URINE PREG, ED: Preg Test, Ur: NEGATIVE

## 2024-02-11 LAB — GROUP A STREP BY PCR: Group A Strep by PCR: DETECTED — AB

## 2024-02-11 MED ORDER — DEXAMETHASONE SOD PHOSPHATE PF 10 MG/ML IJ SOLN
8.0000 mg | Freq: Once | INTRAMUSCULAR | Status: AC
  Administered 2024-02-11: 8 mg via INTRAVENOUS

## 2024-02-11 MED ORDER — ONDANSETRON HCL 4 MG/2ML IJ SOLN
4.0000 mg | Freq: Once | INTRAMUSCULAR | Status: AC
  Administered 2024-02-11: 4 mg via INTRAVENOUS
  Filled 2024-02-11: qty 2

## 2024-02-11 MED ORDER — AZITHROMYCIN 500 MG IV SOLR
500.0000 mg | Freq: Once | INTRAVENOUS | Status: AC
  Administered 2024-02-11: 500 mg via INTRAVENOUS
  Filled 2024-02-11: qty 5

## 2024-02-11 MED ORDER — MORPHINE SULFATE (PF) 4 MG/ML IV SOLN
4.0000 mg | Freq: Once | INTRAVENOUS | Status: AC
  Administered 2024-02-11: 4 mg via INTRAVENOUS
  Filled 2024-02-11: qty 1

## 2024-02-11 MED ORDER — OXYCODONE-ACETAMINOPHEN 5-325 MG PO TABS
1.0000 | ORAL_TABLET | Freq: Once | ORAL | Status: AC
  Administered 2024-02-11: 1 via ORAL
  Filled 2024-02-11: qty 1

## 2024-02-11 MED ORDER — IBUPROFEN 400 MG PO TABS
400.0000 mg | ORAL_TABLET | Freq: Three times a day (TID) | ORAL | 0 refills | Status: AC | PRN
  Filled 2024-02-11: qty 30, 10d supply, fill #0

## 2024-02-11 MED ORDER — AZITHROMYCIN 250 MG PO TABS
ORAL_TABLET | ORAL | 0 refills | Status: AC
  Filled 2024-02-11: qty 6, 5d supply, fill #0

## 2024-02-11 MED ORDER — IOHEXOL 300 MG/ML  SOLN
75.0000 mL | Freq: Once | INTRAMUSCULAR | Status: AC | PRN
  Administered 2024-02-11: 75 mL via INTRAVENOUS

## 2024-02-11 NOTE — ED Provider Notes (Signed)
 [facility] Provider Note    Event Date/Time   First MD Initiated Contact with Patient 02/11/24 1349     (approximate)   History   Sore Throat   HPI  Brianna Ashley is a 38 y.o. female with history of factor V Leiden mutation, lupus anticoagulant positive, known DVT on Eliquis  who presents with complaints of sore throat times approximately 1 week.  She reports it is steadily worsened.  She has more pain on the left than on the right.  She is spitting out her secretions.  She also complains of worsening pain in her left thigh which she thinks is related to DVT     Physical Exam   Triage Vital Signs: ED Triage Vitals  Encounter Vitals Group     BP 02/11/24 1325 139/80     Girls Systolic BP Percentile --      Girls Diastolic BP Percentile --      Boys Systolic BP Percentile --      Boys Diastolic BP Percentile --      Pulse Rate 02/11/24 1325 96     Resp 02/11/24 1325 20     Temp 02/11/24 1325 98.5 F (36.9 C)     Temp Source 02/11/24 1325 Oral     SpO2 02/11/24 1325 100 %     Weight 02/11/24 1331 110 kg (242 lb 8.1 oz)     Height 02/11/24 1331 1.651 m (5' 5)     Head Circumference --      Peak Flow --      Pain Score 02/11/24 1331 10     Pain Loc --      Pain Education --      Exclude from Growth Chart --     Most recent vital signs: Vitals:   02/11/24 1325 02/11/24 1748  BP: 139/80 130/78  Pulse: 96 88  Resp: 20 18  Temp: 98.5 F (36.9 C) 98.6 F (37 C)  SpO2: 100% 100%     General: Awake, tearful, anxious CV:  Good peripheral perfusion.  Resp:  Normal effort.  Abd:  No distention. Other:  Pharynx: No clear evidence of peritonsillar abscess   ED Results / Procedures / Treatments   Labs (all labs ordered are listed, but only abnormal results are displayed) Labs Reviewed  GROUP A STREP BY PCR - Abnormal; Notable for the following components:      Result Value   Group A Strep by PCR DETECTED (*)    All other components  within normal limits  POC URINE PREG, ED     EKG     RADIOLOGY Ultrasound left lower extremity CT soft tissue neck    PROCEDURES:  Critical Care performed:   Procedures   MEDICATIONS ORDERED IN ED: Medications  morphine  (PF) 4 MG/ML injection 4 mg (4 mg Intravenous Given 02/11/24 1448)  ondansetron  (ZOFRAN ) injection 4 mg (4 mg Intravenous Given 02/11/24 1447)  dexamethasone  (DECADRON ) injection 8 mg (8 mg Intravenous Given 02/11/24 1447)  azithromycin (ZITHROMAX) 500 mg in sodium chloride  0.9 % 250 mL IVPB (0 mg Intravenous Stopped 02/11/24 1717)  morphine  (PF) 4 MG/ML injection 4 mg (4 mg Intravenous Given 02/11/24 1650)  iohexol  (OMNIPAQUE ) 300 MG/ML solution 75 mL (75 mLs Intravenous Contrast Given 02/11/24 1737)     IMPRESSION / MDM / ASSESSMENT AND PLAN / ED COURSE  I reviewed the triage vital signs and the nursing notes. Patient's presentation is most consistent with acute presentation with potential threat  to life or bodily function.  Patient presents with 2 major complaints as detailed above, sore throat x 1 week, she is spitting out her secretions, question possible PTA will send for CT soft tissue neck with IV contrast, will treat with Decadron , morphine  and Zofran   Strep test is positive for group A strep, will add on azithromycin  given her allergy  Will repeat ultrasound of the left lower extremity to evaluate DVT  Repeat ultrasound does not show any worsening of DVT, have instructed patient of this and to continue Eliquis   CT scan is negative for drainable abscess, consistent with tonsillitis secondary to group A strep  She has a cephalosporin allergy, she received IV azithromycin  here, will discharge with Z-Pak to start tomorrow.  NSAIDs for pain.  Patient became very upset upon learning that she will be discharged I discussed with her that strep throat does not require hospitalization and that outpatient antibiotics are appropriate.      FINAL  CLINICAL IMPRESSION(S) / ED DIAGNOSES   Final diagnoses:  Strep pharyngitis     Rx / DC Orders   ED Discharge Orders          Ordered    azithromycin  (ZITHROMAX  Z-PAK) 250 MG tablet        02/11/24 1901    ibuprofen  (ADVIL ) 400 MG tablet  Every 8 hours PRN        02/11/24 1901             Note:  This document was prepared using Dragon voice recognition software and may include unintentional dictation errors.   Arlander Charleston, MD 02/11/24 [phone]

## 2024-02-11 NOTE — ED Notes (Signed)
 See triage note  Presents with sore throat ,body aches and cough  Sxs' started about 1 week ago  Afebrile on arrival

## 2024-02-11 NOTE — ED Triage Notes (Signed)
 Pt to ER with c/o sore throat, ear pain and jaw pain that started almost a week ago. Pt also reports left pain that feels like it did when she had a blood clot.  States it shoots pain if she touches or hits it. Says most of pain is in thigh.

## 2024-02-12 ENCOUNTER — Other Ambulatory Visit: Payer: Self-pay

## 2024-03-11 ENCOUNTER — Other Ambulatory Visit: Payer: Self-pay

## 2024-03-11 DIAGNOSIS — T2040XA Corrosion of unspecified degree of head, face, and neck, unspecified site, initial encounter: Secondary | ICD-10-CM | POA: Diagnosis not present

## 2024-03-11 DIAGNOSIS — Y9289 Other specified places as the place of occurrence of the external cause: Secondary | ICD-10-CM | POA: Diagnosis not present

## 2024-03-11 DIAGNOSIS — T65891A Toxic effect of other specified substances, accidental (unintentional), initial encounter: Secondary | ICD-10-CM | POA: Insufficient documentation

## 2024-03-11 DIAGNOSIS — X58XXXA Exposure to other specified factors, initial encounter: Secondary | ICD-10-CM | POA: Diagnosis not present

## 2024-03-11 NOTE — ED Triage Notes (Signed)
 Pt presents for redness, itching and burning to right hand after using a new cleaning chemical last night. Washed hands multiple times without relief. Took Ibuprofen  with no relief. No obivious signs of burns. Slight redness noted to hand. Tender to palpation.

## 2024-03-12 ENCOUNTER — Emergency Department
Admission: EM | Admit: 2024-03-12 | Discharge: 2024-03-12 | Disposition: A | Payer: MEDICAID | Attending: Emergency Medicine | Admitting: Emergency Medicine

## 2024-03-12 DIAGNOSIS — T304 Corrosion of unspecified body region, unspecified degree: Secondary | ICD-10-CM

## 2024-03-12 MED ORDER — ACETAMINOPHEN 500 MG PO TABS
1000.0000 mg | ORAL_TABLET | Freq: Once | ORAL | Status: AC
  Administered 2024-03-12: 1000 mg via ORAL
  Filled 2024-03-12: qty 2

## 2024-03-12 MED ORDER — IBUPROFEN 600 MG PO TABS
600.0000 mg | ORAL_TABLET | Freq: Once | ORAL | Status: AC
  Administered 2024-03-12: 600 mg via ORAL
  Filled 2024-03-12: qty 1

## 2024-03-12 MED ORDER — OXYCODONE HCL 5 MG PO TABS
5.0000 mg | ORAL_TABLET | Freq: Once | ORAL | Status: AC
  Administered 2024-03-12: 5 mg via ORAL
  Filled 2024-03-12: qty 1

## 2024-03-12 NOTE — ED Provider Notes (Signed)
 "  [facility] Provider Note    Event Date/Time   First MD Initiated Contact with Patient 03/12/24 0115     (approximate)   History   Rash   HPI  Brianna Ashley is a 38 y.o. female   Past medical history of factor V Leyden, migraines and seizure who presents emergency department with irritation to the right hand after being exposed on gloved to multiple cleaning products.  This occurred earlier today.  Redness and burning sensation.  She has already flushed thoroughly with water.  No other exposures or acute medical complaints.   External Medical Documents Reviewed: Previous hospital notes      Physical Exam   Triage Vital Signs: ED Triage Vitals [03/11/24 2310]  Encounter Vitals Group     BP 137/75     Girls Systolic BP Percentile      Girls Diastolic BP Percentile      Boys Systolic BP Percentile      Boys Diastolic BP Percentile      Pulse Rate 87     Resp 18     Temp 98 F (36.7 C)     Temp Source Oral     SpO2 100 %     Weight 242 lb 8.1 oz (110 kg)     Height 5' 5 (1.651 m)     Head Circumference      Peak Flow      Pain Score 9     Pain Loc      Pain Education      Exclude from Growth Chart     Most recent vital signs: Vitals:   03/11/24 2310 03/12/24 0236  BP: 137/75 (!) 115/58  Pulse: 87 82  Resp: 18 20  Temp: 98 F (36.7 C) 98.2 F (36.8 C)  SpO2: 100% 100%    General: Awake, no distress.  CV:  Good peripheral perfusion.  Resp:  Normal effort.  Abd:  No distention.  Other:  Redness of the right hand.  No obvious open lesions.  Able to fully range.  Brisk cap refill.   ED Results / Procedures / Treatments   Labs (all labs ordered are listed, but only abnormal results are displayed) Labs Reviewed - No data to display  PROCEDURES:  Critical Care performed: No  Procedures   MEDICATIONS ORDERED IN ED: Medications  acetaminophen  (TYLENOL ) tablet 1,000 mg (1,000 mg Oral Given 03/12/24 0317)  ibuprofen   (ADVIL ) tablet 600 mg (600 mg Oral Given 03/12/24 0317)  oxyCODONE  (Oxy IR/ROXICODONE ) immediate release tablet 5 mg (5 mg Oral Given 03/12/24 0317)    IMPRESSION / MDM / ASSESSMENT AND PLAN / ED COURSE  I reviewed the triage vital signs and the nursing notes.                                Patient's presentation is most consistent with acute complicated illness / injury requiring diagnostic workup.  Differential diagnosis includes, but is not limited to, caustic burn of the hand, considered but less likely bony injuries, infection   MDM:    Likely alkaline or caustic burn of the right hand which has already been thoroughly irrigated.  No signs of infection, low suspicion for traumatic injury.  Anticipatory guidance given.  Discharge.        FINAL CLINICAL IMPRESSION(S) / ED DIAGNOSES   Final diagnoses:  Caustic burn of skin     Rx /  DC Orders   ED Discharge Orders     None        Note:  This document was prepared using Dragon voice recognition software and may include unintentional dictation errors.    Cyrena Mylar, MD 03/12/24 [phone]  "

## 2024-03-12 NOTE — Discharge Instructions (Addendum)
 Take acetaminophen  650 mg and ibuprofen  400 mg every 6 hours for pain.  Take with food. Use Eucerin or similar skin emollient and apply daily.  Thank you for choosing us  for your health care today!  Please see your primary doctor this week for a follow up appointment.   If you have any new, worsening, or unexpected symptoms call your doctor right away or come back to the emergency department for reevaluation.  It was my pleasure to care for you today.   Ginnie EDISON Cyrena, MD

## 2024-03-19 ENCOUNTER — Other Ambulatory Visit: Payer: Self-pay

## 2024-03-19 ENCOUNTER — Emergency Department: Payer: MEDICAID

## 2024-03-19 ENCOUNTER — Emergency Department
Admission: EM | Admit: 2024-03-19 | Discharge: 2024-03-19 | Disposition: A | Discharge status: Home / Self Care | Payer: MEDICAID | Attending: Emergency Medicine | Admitting: Emergency Medicine

## 2024-03-19 DIAGNOSIS — J101 Influenza due to other identified influenza virus with other respiratory manifestations: Secondary | ICD-10-CM | POA: Insufficient documentation

## 2024-03-19 DIAGNOSIS — R509 Fever, unspecified: Secondary | ICD-10-CM | POA: Diagnosis present

## 2024-03-19 DIAGNOSIS — I82432 Acute embolism and thrombosis of left popliteal vein: Secondary | ICD-10-CM | POA: Diagnosis not present

## 2024-03-19 DIAGNOSIS — J45909 Unspecified asthma, uncomplicated: Secondary | ICD-10-CM | POA: Insufficient documentation

## 2024-03-19 LAB — RESP PANEL BY RT-PCR (RSV, FLU A&B, COVID)  RVPGX2
Influenza A by PCR: POSITIVE — AB
Influenza B by PCR: NEGATIVE
Resp Syncytial Virus by PCR: NEGATIVE
SARS Coronavirus 2 by RT PCR: NEGATIVE

## 2024-03-19 LAB — BASIC METABOLIC PANEL WITH GFR
Anion gap: 13 (ref 5–15)
BUN: 11 mg/dL (ref 6–20)
CO2: 20 mmol/L — ABNORMAL LOW (ref 22–32)
Calcium: 9.1 mg/dL (ref 8.9–10.3)
Chloride: 104 mmol/L (ref 98–111)
Creatinine, Ser: 0.91 mg/dL (ref 0.44–1.00)
GFR, Estimated: >60 mL/min
Glucose, Bld: 95 mg/dL (ref 70–99)
Potassium: 4 mmol/L (ref 3.5–5.1)
Sodium: 136 mmol/L (ref 135–145)

## 2024-03-19 LAB — URINALYSIS, ROUTINE W REFLEX MICROSCOPIC
Bilirubin Urine: NEGATIVE
Glucose, UA: NEGATIVE mg/dL
Ketones, ur: NEGATIVE mg/dL
Leukocytes,Ua: NEGATIVE
Nitrite: NEGATIVE
Protein, ur: 30 mg/dL — AB
Specific Gravity, Urine: 1.014 (ref 1.005–1.030)
pH: 5 (ref 5.0–8.0)

## 2024-03-19 LAB — D-DIMER, QUANTITATIVE: D-Dimer, Quant: 1.2 ug{FEU}/mL — ABNORMAL HIGH (ref 0.00–0.50)

## 2024-03-19 LAB — PRO BRAIN NATRIURETIC PEPTIDE: Pro Brain Natriuretic Peptide: 481 pg/mL — ABNORMAL HIGH

## 2024-03-19 LAB — CBC
HCT: 41 % (ref 36.0–46.0)
Hemoglobin: 14.2 g/dL (ref 12.0–15.0)
MCH: 30.6 pg (ref 26.0–34.0)
MCHC: 34.6 g/dL (ref 30.0–36.0)
MCV: 88.4 fL (ref 80.0–100.0)
Platelets: 251 K/uL (ref 150–400)
RBC: 4.64 MIL/uL (ref 3.87–5.11)
RDW: 13.6 % (ref 11.5–15.5)
WBC: 5.7 K/uL (ref 4.0–10.5)
nRBC: 0 % (ref 0.0–0.2)

## 2024-03-19 MED ORDER — APIXABAN 5 MG PO TABS
10.0000 mg | ORAL_TABLET | Freq: Once | ORAL | Status: AC
  Administered 2024-03-19: 10 mg via ORAL
  Filled 2024-03-19: qty 2

## 2024-03-19 MED ORDER — KETOROLAC TROMETHAMINE 15 MG/ML IJ SOLN
15.0000 mg | Freq: Once | INTRAMUSCULAR | Status: AC
  Administered 2024-03-19: 15 mg via INTRAVENOUS
  Filled 2024-03-19: qty 1

## 2024-03-19 MED ORDER — IOHEXOL 350 MG/ML SOLN
100.0000 mL | Freq: Once | INTRAVENOUS | Status: AC | PRN
  Administered 2024-03-19: 100 mL via INTRAVENOUS

## 2024-03-19 MED ORDER — IPRATROPIUM-ALBUTEROL 0.5-2.5 (3) MG/3ML IN SOLN
6.0000 mL | Freq: Once | RESPIRATORY_TRACT | Status: AC
  Administered 2024-03-19: 6 mL via RESPIRATORY_TRACT
  Filled 2024-03-19: qty 6

## 2024-03-19 MED ORDER — CYCLOBENZAPRINE HCL 10 MG PO TABS
5.0000 mg | ORAL_TABLET | Freq: Once | ORAL | Status: AC
  Administered 2024-03-19: 5 mg via ORAL
  Filled 2024-03-19: qty 1

## 2024-03-19 MED ORDER — ACETAMINOPHEN 500 MG PO TABS
1000.0000 mg | ORAL_TABLET | Freq: Once | ORAL | Status: AC
  Administered 2024-03-19: 1000 mg via ORAL
  Filled 2024-03-19: qty 2

## 2024-03-19 MED ORDER — APIXABAN 5 MG PO TABS: ORAL_TABLET | ORAL | 0 refills | Status: AC

## 2024-03-19 NOTE — Discharge Instructions (Addendum)
 You were seen in the emergency department today for a cough. Your positive for influenza A.  A viral cough may last up to 2-3 weeks.   Your ultrasound reveals A component of acute nonocclusive thrombus is suspected within the LEFT popliteal vein.  We have started you on Eliquis  starter pack.  Please take as instructed.  Follow-up with your primary care provider, a referral has been placed.  I have also included vascular if you are unable to follow-up with your primary care provider.  Your chest x-ray is normal.  Stay hydrated by drinking plenty of fluids to thin mucus. Get adequate amount of sleep and avoid overexertion. Consider a humidifier at night. Warm teas and a spoonful of honey may help reduce cough frequency.   Pain control:  Ibuprofen  (motrin /aleve /advil ) - You can take 3 tablets (600 mg) every 6 hours as needed for pain/fever.  Acetaminophen  (tylenol ) - You can take 2 extra strength tablets (1000 mg) every 6 hours as needed for pain/fever.  You can alternate these medications or take them together.  Make sure you eat food/drink water when taking these medications.

## 2024-03-19 NOTE — ED Notes (Signed)
 PT seen ambulating from bathroom back to room. Dyspnea on exertion. Assisted to bed, HOB elevated.

## 2024-03-19 NOTE — ED Provider Notes (Signed)
 "   [facility] Emergency Department Provider Note     Event Date/Time   First MD Initiated Contact with Patient 03/19/24 1617     (approximate)   History   Influenza and Leg Swelling   HPI  Brianna Ashley is a 38 y.o. female with a past medical history of factor V Leyden, DVTs of the lower extremity, asthma, migraine and seizures presents to the ED for evaluation of generalized bodyaches, headache, subjective fevers at home however does not have a thermometer, productive cough, diarrhea nausea with associated chest pain and shortness of breath.  Symptoms started Christmas Eve which was approximately 4 days ago.  She also endorses bilateral leg swelling and has history of blood clots.  She denies recent immobilization or surgeries.  No birth control use at this time.  She does report standing on her feet at work all day.  Patient also endorses daily smoking and history of PE.  No history of CHF.    Physical Exam   Triage Vital Signs: ED Triage Vitals  Encounter Vitals Group     BP 03/19/24 1409 (!) 134/91     Girls Systolic BP Percentile --      Girls Diastolic BP Percentile --      Boys Systolic BP Percentile --      Boys Diastolic BP Percentile --      Pulse Rate 03/19/24 1409 86     Resp 03/19/24 1409 18     Temp 03/19/24 1409 98.5 F (36.9 C)     Temp Source 03/19/24 1409 Oral     SpO2 03/19/24 1409 100 %     Weight 03/19/24 1409 240 lb (108.9 kg)     Height 03/19/24 1409 5' 5 (1.651 m)     Head Circumference --      Peak Flow --      Pain Score 03/19/24 1416 10     Pain Loc --      Pain Education --      Exclude from Growth Chart --     Most recent vital signs: Vitals:   03/19/24 1409 03/19/24 2006  BP: (!) 134/91 (!) 144/49  Pulse: 86 69  Resp: 18 20  Temp: 98.5 F (36.9 C) 98.5 F (36.9 C)  SpO2: 100% 98%    General Awake, no distress.  HEENT NCAT. CV:  Good peripheral perfusion.  RRR RESP:  Normal effort.  Bilateral  wheezing noted ABD:  No distention.  Soft, nontender Other:  No obvious leg swelling noted.  No pitting edema.  Pedal pulses are palpated bilaterally.  Patient does report tenderness to palpation with calf squeeze on bilateral legs.   ED Results / Procedures / Treatments   Labs (all labs ordered are listed, but only abnormal results are displayed) Labs Reviewed  RESP PANEL BY RT-PCR (RSV, FLU A&B, COVID)  RVPGX2 - Abnormal; Notable for the following components:      Result Value   Influenza A by PCR POSITIVE (*)    All other components within normal limits  BASIC METABOLIC PANEL WITH GFR - Abnormal; Notable for the following components:   CO2 20 (*)    All other components within normal limits  D-DIMER, QUANTITATIVE - Abnormal; Notable for the following components:   D-Dimer, Quant 1.20 (*)    All other components within normal limits  URINALYSIS, ROUTINE W REFLEX MICROSCOPIC - Abnormal; Notable for the following components:   Color, Urine YELLOW (*)    APPearance  HAZY (*)    Hgb urine dipstick MODERATE (*)    Protein, ur 30 (*)    Bacteria, UA RARE (*)    All other components within normal limits  PRO BRAIN NATRIURETIC PEPTIDE - Abnormal; Notable for the following components:   Pro Brain Natriuretic Peptide 481.0 (*)    All other components within normal limits  CBC  PREGNANCY, URINE    EKG  Normal sinus rhythm  RADIOLOGY  I personally viewed and evaluated these images as part of my medical decision making, as well as reviewing the written report by the radiologist.  CT Angio Chest PE W and/or Wo Contrast Result Date: 03/19/2024 CLINICAL DATA:  Chest pain with cough, diarrhea and generalized body aches. EXAM: CT ANGIOGRAPHY CHEST WITH CONTRAST TECHNIQUE: Multidetector CT imaging of the chest was performed using the standard protocol during bolus administration of intravenous contrast. Multiplanar CT image reconstructions and MIPs were obtained to evaluate the vascular  anatomy. RADIATION DOSE REDUCTION: This exam was performed according to the departmental dose-optimization program which includes automated exposure control, adjustment of the mA and/or kV according to patient size and/or use of iterative reconstruction technique. CONTRAST:  OMNIPAQUE  IOHEXOL  350 MG/ML SOLN COMPARISON:  November 27, 2023 FINDINGS: Cardiovascular: Satisfactory opacification of the pulmonary arteries to the segmental level. No evidence of pulmonary embolism. Normal heart size. No pericardial effusion. Mediastinum/Nodes: No enlarged mediastinal, hilar, or axillary lymph nodes. Thyroid  gland, trachea, and esophagus demonstrate no significant findings. Lungs/Pleura: Mild, bilateral hazy areas of ground-glass appearing lung parenchyma are seen. A stable, ill-defined, pleural based 4 mm anterior right lower lobe pulmonary nodule is seen (axial CT image 70, CT series 5). An additional stable 3 mm anterior right middle lobe pulmonary nodule is noted (axial CT image 61, CT series 5). No focal consolidation, pleural effusion or pneumothorax is identified. Upper Abdomen: No acute abnormality. Musculoskeletal: No chest wall abnormality. No acute or significant osseous findings. Review of the MIP images confirms the above findings. IMPRESSION: 1. No evidence of pulmonary embolism. 2. Mild, bilateral hazy areas of ground-glass appearing lung parenchyma which may represent mild pulmonary edema. 3. Stable 3 mm and 4 mm right middle lobe and right lower lobe pulmonary nodules. No follow-up needed if patient is low-risk (and has no known or suspected primary neoplasm). Non-contrast chest CT can be considered in 12 months if patient is high-risk. This recommendation follows the consensus statement: Guidelines for Management of Incidental Pulmonary Nodules Detected on CT Images: From the Fleischner Society 2017; Radiology 2017; 284:228-243. Electronically Signed   By: Suzen Dials M.D.   On: 03/19/2024 22:41    US  Venous Img Lower Bilateral Result Date: 03/19/2024 CLINICAL DATA:  Cough. EXAM: BILATERAL LOWER EXTREMITY VENOUS DOPPLER ULTRASOUND TECHNIQUE: Gray-scale sonography with graded compression, as well as color Doppler and duplex ultrasound were performed to evaluate the lower extremity deep venous systems from the level of the common femoral vein and including the common femoral, femoral, profunda femoral, popliteal and calf veins including the posterior tibial, peroneal and gastrocnemius veins when visible. The superficial great saphenous vein was also interrogated. Spectral Doppler was utilized to evaluate flow at rest and with distal augmentation maneuvers in the common femoral, femoral and popliteal veins. COMPARISON:  February 11, 2024 FINDINGS: RIGHT LOWER EXTREMITY Common Femoral Vein: No evidence of thrombus. Normal compressibility, respiratory phasicity and response to augmentation. Saphenofemoral Junction: No evidence of thrombus. Normal compressibility and flow on color Doppler imaging. Profunda Femoral Vein: No evidence of thrombus. Normal compressibility  and flow on color Doppler imaging. Femoral Vein: No evidence of thrombus. Normal compressibility, respiratory phasicity and response to augmentation. Popliteal Vein: No evidence of thrombus. Normal compressibility, respiratory phasicity and response to augmentation. Calf Veins: No evidence of thrombus. Normal compressibility and flow on color Doppler imaging. Superficial Great Saphenous Vein: No evidence of thrombus. Normal compressibility. Venous Reflux:  None. Other Findings:  None. LEFT LOWER EXTREMITY Common Femoral Vein: No evidence of thrombus. Normal compressibility, respiratory phasicity and response to augmentation. Saphenofemoral Junction: No evidence of thrombus. Normal compressibility and flow on color Doppler imaging. Profunda Femoral Vein: No evidence of thrombus. Normal compressibility and flow on color Doppler imaging. Femoral  Vein: There is evidence of nonocclusive thrombus with normal compressibility, respiratory phasicity and response to augmentation. Popliteal Vein: There is evidence of nonocclusive thrombus with abnormal compressibility, respiratory phasicity and response to augmentation. Calf Veins: No evidence of thrombus. Normal compressibility and flow on color Doppler imaging. Superficial Great Saphenous Vein: No evidence of thrombus. Normal compressibility. Venous Reflux:  None. Other Findings:  None. IMPRESSION: 1. No evidence of deep venous thrombosis within the RIGHT lower extremity. 2. 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 A component of acute nonocclusive thrombus is suspected within the LEFT popliteal vein. Electronically Signed   By: Suzen Dials M.D.   On: 03/19/2024 19:21   DG Chest 2 View Result Date: 03/19/2024 CLINICAL DATA:  Cough, congestion, diarrhea and general body aches with nausea. EXAM: CHEST - 2 VIEW COMPARISON:  November 26, 2023 FINDINGS: The heart size and mediastinal contours are within normal limits. Both lungs are clear. The visualized skeletal structures are unremarkable. IMPRESSION: No active cardiopulmonary disease. Electronically Signed   By: Suzen Dials M.D.   On: 03/19/2024 19:14    PROCEDURES:  Critical Care performed: No  Procedures   MEDICATIONS ORDERED IN ED: Medications  ipratropium-albuterol  (DUONEB) 0.5-2.5 (3) MG/3ML nebulizer solution 6 mL (6 mLs Nebulization Given 03/19/24 1938)  ketorolac  (TORADOL ) 15 MG/ML injection 15 mg (15 mg Intravenous Given 03/19/24 2003)  apixaban  (ELIQUIS ) tablet 10 mg (10 mg Oral Given 03/19/24 2059)  ketorolac  (TORADOL ) 15 MG/ML injection 15 mg (15 mg Intravenous Given 03/19/24 2125)  cyclobenzaprine  (FLEXERIL ) tablet 5 mg (5 mg Oral Given 03/19/24 2125)  iohexol  (OMNIPAQUE ) 350 MG/ML injection 100 mL (100 mLs Intravenous Contrast Given 03/19/24 2138)  acetaminophen   (TYLENOL ) tablet 1,000 mg (1,000 mg Oral Given 03/19/24 2309)     IMPRESSION / MDM / ASSESSMENT AND PLAN / ED COURSE  I reviewed the triage vital signs and the nursing notes.                              Clinical Course as of 03/20/24 0021  Sat Mar 19, 2024  2006 US  Venous Img Lower Bilateral IMPRESSION: 1. No evidence of deep venous thrombosis within the RIGHT lower extremity. 2. 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 A component of acute nonocclusive thrombus is suspected within the LEFT popliteal vein.   [MH]  2006 Eliquis  started for nonocclusive thrombus within left popliteal vein. [MH]  2253 CT Angio Chest PE W and/or Wo Contrast IMPRESSION: 1. No evidence of pulmonary embolism. 2. Mild, bilateral hazy areas of ground-glass appearing lung parenchyma which may represent mild pulmonary edema. 3. Stable 3 mm and 4 mm right middle lobe and right lower lobe pulmonary nodules. No follow-up needed if patient  is low-risk (and has no known or suspected primary neoplasm). Non-contrast chest CT can be considered in 12 months if patient is high-risk. This recommendation follows the consensus statement: Guidelines for Management of Incidental Pulmonary Nodules Detected on CT Images: From the Fleischner Society 2017; Radiology 2017; 284:228-243.   [MH]  2253 Pro Brain natriuretic peptide(!) Suspect elevation secondary to positive influenza A [MH]  2257 DG Chest 2 View IMPRESSION: No active cardiopulmonary disease.   [MH]    Clinical Course User Index [MH] Margrette Monte A, PA-C    38 y.o. female presents to the emergency department for evaluation and treatment of generalized body aches, chest pain, shortness of breath and fever with productive cough and bilateral leg swelling. See HPI for further details.   Differential diagnosis includes, but is not limited to viral URI, influenza, bronchitis, DVT, PNA  PE  Patient's presentation is most consistent with acute presentation with potential threat to life or bodily function.  Patient with history of factor V Leyden and prior DVT and PE disease presents with 4 days of generalized bodyaches cough, bilateral lower extremity swelling, chest pain and shortness of breath.  Basic lab  work obtained in triage which is reassuring.  D-dimer obtained in triage showing elevation.  Given her hypercoagulability history will further workup with EKG, chest x-ray, ultrasound, CT angio chest to rule out PE and obtain BNP.  EKG shows normal sinus rhythm.  Chest x-ray is reassuring with no evidence of pneumonia.  Normal urinalysis.  Respiratory panel is positive for influenza A.  Ultrasound shows a nonocclusive and chronic appearing thrombus within the left femoral vein and left popliteal vein and a component of an acute nonocclusive thrombus is suspected within the left popliteal vein.  Will start first dose of Eliquis  and then start on starter pack.   CT angio chest rules out PE.  Given stable vital signs and reassuring imaging patient is appropriate for outpatient management.  Symptomatic treatment care education provided to patient.  Strict ED return precautions discussed.  Referral for primary care provider has been placed.  Patient is advised if symptoms in the legs get worse to follow-up with vascular for further evaluation.  Patient is agreement with this care plan.  Stable at discharge. All questions and concerns were addressed during this ED visit.     FINAL CLINICAL IMPRESSION(S) / ED DIAGNOSES   Final diagnoses:  Influenza A  Acute deep vein thrombosis (DVT) of popliteal vein of left lower extremity (HCC)     Rx / DC Orders   ED Discharge Orders          Ordered    apixaban  (ELIQUIS ) 5 MG TABS tablet        03/19/24 2255    Ambulatory Referral to Primary Care (Establish Care)        03/19/24 2256             Note:  This document was prepared  using Dragon voice recognition software and may include unintentional dictation errors.    Margrette, Travarius Lange A, PA-C 03/20/24 Brianna Jacolyn Pae, MD 03/20/24 2341  "

## 2024-03-19 NOTE — ED Notes (Signed)
 Attempted to medicate and do EKG on pt however ultrasound was in with the pt.

## 2024-03-19 NOTE — ED Notes (Signed)
 Attempted to look for a suitable vein to draw blood. Pt constantly stating that the tourniquet is too tight and it has never been that tight before. Tried looking at R arm for blood draw after looking at L arm, pt moves this techs hand out of the way to say you need to be careful with this arm because it is still messed up from the last time I was here, that is why I gave you the other arm Prior to looking for a vein, this tech asked pt if she had a preference on an arm for blood work, pt stated whichever one. Pt complaining about the tourniquet and keeps asking where is the girl who stuck me last time. This tech could not find a suitable vein, zach, RN to call lab

## 2024-03-19 NOTE — ED Triage Notes (Signed)
 Pt to ED for flu like symptoms that started 12/24. Pt states having congestion, cough, diarrhea, general body aches and nausea. Pt states nephew was sick recently and spent time with him over the holidays. Pt also stating legs are swollen, which is chronic, and has a problem with blood clots in her legs. 10/10 pain, states general body pain

## 2024-03-19 NOTE — ED Notes (Signed)
 Pt difficult stick, unable to get labs at this time. Lab called to collect samples. Pt placed in triage middle.

## 2024-05-27 ENCOUNTER — Ambulatory Visit: Payer: Self-pay | Admitting: Family Medicine
